# Patient Record
Sex: Female | Born: 1945
Health system: Southern US, Community
[De-identification: ages and names within clinical notes are randomized; demographics above are authoritative.]

## PROBLEM LIST (undated history)

## (undated) DIAGNOSIS — M1990 Unspecified osteoarthritis, unspecified site: Secondary | ICD-10-CM

## (undated) DIAGNOSIS — D649 Anemia, unspecified: Secondary | ICD-10-CM

## (undated) DIAGNOSIS — R011 Cardiac murmur, unspecified: Secondary | ICD-10-CM

## (undated) DIAGNOSIS — F32A Depression, unspecified: Secondary | ICD-10-CM

## (undated) DIAGNOSIS — F329 Major depressive disorder, single episode, unspecified: Secondary | ICD-10-CM

## (undated) DIAGNOSIS — Z9289 Personal history of other medical treatment: Secondary | ICD-10-CM

## (undated) DIAGNOSIS — G20 Parkinson's disease: Secondary | ICD-10-CM

## (undated) DIAGNOSIS — G20A1 Parkinson's disease without dyskinesia, without mention of fluctuations: Secondary | ICD-10-CM

## (undated) DIAGNOSIS — I10 Essential (primary) hypertension: Secondary | ICD-10-CM

## (undated) DIAGNOSIS — E785 Hyperlipidemia, unspecified: Secondary | ICD-10-CM

## (undated) DIAGNOSIS — I639 Cerebral infarction, unspecified: Secondary | ICD-10-CM

## (undated) HISTORY — PX: ABDOMINAL HYSTERECTOMY: SHX81

## (undated) HISTORY — DX: Cerebral infarction, unspecified: I63.9

## (undated) HISTORY — DX: Anemia, unspecified: D64.9

## (undated) HISTORY — DX: Hyperlipidemia, unspecified: E78.5

## (undated) HISTORY — DX: Unspecified osteoarthritis, unspecified site: M19.90

## (undated) HISTORY — PX: HAND SURGERY: SHX662

## (undated) HISTORY — DX: Essential (primary) hypertension: I10

---

## 1999-10-13 ENCOUNTER — Encounter: Admission: RE | Admit: 1999-10-13 | Discharge: 1999-11-04 | Payer: Self-pay | Admitting: Neurology

## 2000-07-14 ENCOUNTER — Other Ambulatory Visit: Admission: RE | Admit: 2000-07-14 | Discharge: 2000-07-14 | Payer: Self-pay | Admitting: Obstetrics and Gynecology

## 2000-10-06 ENCOUNTER — Ambulatory Visit (HOSPITAL_BASED_OUTPATIENT_CLINIC_OR_DEPARTMENT_OTHER): Admission: RE | Admit: 2000-10-06 | Discharge: 2000-10-06 | Payer: Self-pay | Admitting: Orthopedic Surgery

## 2001-06-20 ENCOUNTER — Emergency Department (HOSPITAL_COMMUNITY): Admission: EM | Admit: 2001-06-20 | Discharge: 2001-06-20 | Payer: Self-pay | Admitting: Emergency Medicine

## 2001-06-20 ENCOUNTER — Encounter: Payer: Self-pay | Admitting: Emergency Medicine

## 2001-09-01 ENCOUNTER — Other Ambulatory Visit: Admission: RE | Admit: 2001-09-01 | Discharge: 2001-09-01 | Payer: Self-pay | Admitting: Obstetrics and Gynecology

## 2002-09-01 ENCOUNTER — Other Ambulatory Visit: Admission: RE | Admit: 2002-09-01 | Discharge: 2002-09-01 | Payer: Self-pay | Admitting: Obstetrics and Gynecology

## 2011-03-11 ENCOUNTER — Emergency Department (HOSPITAL_COMMUNITY): Payer: Medicare Other

## 2011-03-11 ENCOUNTER — Inpatient Hospital Stay (HOSPITAL_COMMUNITY)
Admission: EM | Admit: 2011-03-11 | Discharge: 2011-03-16 | DRG: 065 | Disposition: A | Discharge status: Rehabilitation Facility | Payer: Medicare Other | Attending: Family Medicine | Admitting: Family Medicine

## 2011-03-11 DIAGNOSIS — G8190 Hemiplegia, unspecified affecting unspecified side: Secondary | ICD-10-CM | POA: Diagnosis present

## 2011-03-11 DIAGNOSIS — Z7982 Long term (current) use of aspirin: Secondary | ICD-10-CM

## 2011-03-11 DIAGNOSIS — M549 Dorsalgia, unspecified: Secondary | ICD-10-CM | POA: Diagnosis present

## 2011-03-11 DIAGNOSIS — M1990 Unspecified osteoarthritis, unspecified site: Secondary | ICD-10-CM | POA: Diagnosis present

## 2011-03-11 DIAGNOSIS — E785 Hyperlipidemia, unspecified: Secondary | ICD-10-CM | POA: Diagnosis present

## 2011-03-11 DIAGNOSIS — R29810 Facial weakness: Secondary | ICD-10-CM | POA: Diagnosis present

## 2011-03-11 DIAGNOSIS — F411 Generalized anxiety disorder: Secondary | ICD-10-CM | POA: Diagnosis present

## 2011-03-11 DIAGNOSIS — I6350 Cerebral infarction due to unspecified occlusion or stenosis of unspecified cerebral artery: Principal | ICD-10-CM | POA: Diagnosis present

## 2011-03-11 DIAGNOSIS — G609 Hereditary and idiopathic neuropathy, unspecified: Secondary | ICD-10-CM | POA: Diagnosis present

## 2011-03-11 DIAGNOSIS — K589 Irritable bowel syndrome without diarrhea: Secondary | ICD-10-CM | POA: Diagnosis present

## 2011-03-11 LAB — COMPREHENSIVE METABOLIC PANEL
ALT: 19 U/L (ref 0–35)
AST: 22 U/L (ref 0–37)
Albumin: 3.7 g/dL (ref 3.5–5.2)
Alkaline Phosphatase: 87 U/L (ref 39–117)
BUN: 10 mg/dL (ref 6–23)
CO2: 27 mEq/L (ref 19–32)
Calcium: 10 mg/dL (ref 8.4–10.5)
Chloride: 101 mEq/L (ref 96–112)
Creatinine, Ser: 0.67 mg/dL (ref 0.4–1.2)
GFR calc Af Amer: >60 mL/min (ref >60)
GFR calc non Af Amer: >60 mL/min (ref >60)
Glucose, Bld: 108 mg/dL — ABNORMAL HIGH (ref 70–99)
Potassium: 3.8 mEq/L (ref 3.5–5.1)
Sodium: 137 mEq/L (ref 135–145)
Total Bilirubin: 0.4 mg/dL (ref 0.3–1.2)
Total Protein: 7 g/dL (ref 6.0–8.3)

## 2011-03-11 LAB — DIFFERENTIAL
Basophils Absolute: 0 10*3/uL (ref 0.0–0.1)
Basophils Relative: 1 % (ref 0–1)
Eosinophils Absolute: 0.1 10*3/uL (ref 0.0–0.7)
Eosinophils Relative: 2 % (ref 0–5)
Lymphocytes Relative: 35 % (ref 12–46)
Lymphs Abs: 2.1 10*3/uL (ref 0.7–4.0)
Monocytes Absolute: 0.6 10*3/uL (ref 0.1–1.0)
Monocytes Relative: 10 % (ref 3–12)
Neutro Abs: 3 10*3/uL (ref 1.7–7.7)
Neutrophils Relative %: 52 % (ref 43–77)

## 2011-03-11 LAB — URINALYSIS, ROUTINE W REFLEX MICROSCOPIC
Bilirubin Urine: NEGATIVE
Glucose, UA: NEGATIVE mg/dL
Hgb urine dipstick: NEGATIVE
Ketones, ur: NEGATIVE mg/dL
Nitrite: NEGATIVE
Protein, ur: NEGATIVE mg/dL
Specific Gravity, Urine: 1.013 (ref 1.005–1.030)
Urobilinogen, UA: 0.2 mg/dL (ref 0.0–1.0)
pH: 7 (ref 5.0–8.0)

## 2011-03-11 LAB — CBC
HCT: 40.4 % (ref 36.0–46.0)
Hemoglobin: 13.7 g/dL (ref 12.0–15.0)
MCH: 30.2 pg (ref 26.0–34.0)
MCHC: 33.9 g/dL (ref 30.0–36.0)
MCV: 89 fL (ref 78.0–100.0)
Platelets: 366 10*3/uL (ref 150–400)
RBC: 4.54 MIL/uL (ref 3.87–5.11)
RDW: 13.1 % (ref 11.5–15.5)
WBC: 5.9 10*3/uL (ref 4.0–10.5)

## 2011-03-11 LAB — CK TOTAL AND CKMB (NOT AT ARMC)
CK, MB: 3.3 ng/mL (ref 0.3–4.0)
Relative Index: 2.6 — ABNORMAL HIGH (ref 0.0–2.5)
Total CK: 125 U/L (ref 7–177)

## 2011-03-11 LAB — PROTIME-INR
INR: 0.9 (ref 0.00–1.49)
Prothrombin Time: 12.4 seconds (ref 11.6–15.2)

## 2011-03-11 LAB — APTT: aPTT: 31 seconds (ref 24–37)

## 2011-03-11 LAB — TROPONIN I: Troponin I: <0.3 ng/mL (ref <0.30)

## 2011-03-12 DIAGNOSIS — G459 Transient cerebral ischemic attack, unspecified: Secondary | ICD-10-CM

## 2011-03-12 DIAGNOSIS — R42 Dizziness and giddiness: Secondary | ICD-10-CM

## 2011-03-12 LAB — CBC
HCT: 39.4 % (ref 36.0–46.0)
Hemoglobin: 13.2 g/dL (ref 12.0–15.0)
MCH: 29.9 pg (ref 26.0–34.0)
MCHC: 33.5 g/dL (ref 30.0–36.0)
MCV: 89.1 fL (ref 78.0–100.0)
Platelets: 374 10*3/uL (ref 150–400)
RBC: 4.42 MIL/uL (ref 3.87–5.11)
RDW: 13.1 % (ref 11.5–15.5)
WBC: 5.2 10*3/uL (ref 4.0–10.5)

## 2011-03-12 LAB — COMPREHENSIVE METABOLIC PANEL
ALT: 18 U/L (ref 0–35)
AST: 18 U/L (ref 0–37)
Albumin: 3.3 g/dL — ABNORMAL LOW (ref 3.5–5.2)
Alkaline Phosphatase: 81 U/L (ref 39–117)
BUN: 10 mg/dL (ref 6–23)
CO2: 24 mEq/L (ref 19–32)
Calcium: 9 mg/dL (ref 8.4–10.5)
Chloride: 104 mEq/L (ref 96–112)
Creatinine, Ser: 0.68 mg/dL (ref 0.4–1.2)
GFR calc Af Amer: >60 mL/min (ref >60)
GFR calc non Af Amer: >60 mL/min (ref >60)
Glucose, Bld: 110 mg/dL — ABNORMAL HIGH (ref 70–99)
Potassium: 4 mEq/L (ref 3.5–5.1)
Sodium: 137 mEq/L (ref 135–145)
Total Bilirubin: 0.3 mg/dL (ref 0.3–1.2)
Total Protein: 6.5 g/dL (ref 6.0–8.3)

## 2011-03-12 LAB — URINE CULTURE
Colony Count: >=100000
Culture  Setup Time: 201205021942

## 2011-03-12 LAB — LIPID PANEL
Cholesterol: 236 mg/dL — ABNORMAL HIGH (ref 0–200)
HDL: 72 mg/dL (ref >39)
LDL Cholesterol: 121 mg/dL — ABNORMAL HIGH (ref 0–99)
Total CHOL/HDL Ratio: 3.3 RATIO
Triglycerides: 216 mg/dL — ABNORMAL HIGH (ref <150)
VLDL: 43 mg/dL — ABNORMAL HIGH (ref 0–40)

## 2011-03-12 LAB — HEMOGLOBIN A1C
Hgb A1c MFr Bld: 6 % — ABNORMAL HIGH (ref <5.7)
Mean Plasma Glucose: 126 mg/dL — ABNORMAL HIGH (ref <117)

## 2011-03-12 NOTE — Consult Note (Signed)
Dana [name], LAU                ACCOUNT NO.:  [id number]  MEDICAL RECORD NO.:  [id number]           PATIENT TYPE:  E  LOCATION:  [facility]                         FACILITY:  [facility]  PHYSICIAN:  Dana Heritage, MD       DATE OF BIRTH:  19-Jul-1946  DATE OF CONSULTATION:  03/11/2011 DATE OF DISCHARGE:                                CONSULTATION   CHIEF COMPLAINT:  [name].  HISTORY OF PRESENT ILLNESS:  The patient is a 65 year old female, who presents to [facility] ED with main concern of left-sided upper and lower extremity weakness, which started approximately 2 a.m. morning of admission and is associated with left-sided facial weakness.  The patient tried to get up and walk and was having difficulty.  Mom reports slurred speech over the phone.  The patient denies similar episodes in the past.  Reports she also went to work in the morning, came back home since her weakness is getting progressively worse.  ALLERGIES:  PERCODAN causes nausea and vomiting.  PAST MEDICAL HISTORY: 1. Questionable anemia. 2. Questionable heart murmur since birth. 3. Chronic back pain. 4. History of appendectomy.  HOME MEDICATIONS:  Tylenol, calcium, cyclobenzaprine, dicyclomine, Enjuvia, gabapentin, and multivitamin.  FAMILY HISTORY:  Hypertension in both parents and diabetes in mother and sister.  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 Does not smoke, drink, or use illicit drugs.  PHYSICAL EXAMINATION:  VITAL SIGNS:  Temperature 98.1 Fahrenheit, blood pressure 175/97, heart rate 77, respirations 16, and oxygen saturation 98% on room air. GENERAL:  Lying in bed, not in acute distress. PULMONARY:  Clear to auscultation bilaterally.  No wheezing. CARDIOVASCULAR:  Regular rate and rhythm.  S1 and S2 present.  Systolic ejection murmur 2/6. NEUROLOGIC:  PERRLA and EOMI.  Mucous membranes moist.  Motor strength in  left upper extremity 4/5, right upper extremity 5/5, left lower extremity 4/5, and right lower extremity 5/5.  Sensation intact to soft touch and pinprick throughout, difficulty performing finger-to-nose with left arm, and left-sided facial weakness.  Other cranial nerves except cranial nerve VII grossly intact.  LABORATORY DATA:  Labs pending.  Chest x-ray, no acute findings.  CT of the head, no acute intracranial finding.  ASSESSMENT AND PLAN:  [name] with no significant past medical history presents to [facility] ED with left-sided weakness in upper and lower extremity with facial droop of upper motor neuron type. My impression is that stroke is localized more to the pontine area than right MCA subcortical internal capsular region. MRI will tell further details and shed light on the mechanism of stroke.  TPA was not given as the Symptoms onset was more than 10 hours.  PLAN: 1. Aspirin 325 mg once daily. 2. MRI-MRA of the brain. 3. Cardiac echo. 4. Carotid Doppler. 5. fasting lipid panel 6. Hemoglobin A1c. 7. Stroke team will followup.          ______________________________ Dana Heritage, MD     WS/MEDQ  D:  03/11/2011  T:  03/11/2011  Job:  [id number]  Electronically Signed  by Dana Heritage MD on 03/12/2011 07:23:52 AM

## 2011-03-12 NOTE — H&P (Signed)
NAMEJALANI, Dana [name]                ACCOUNT NO.:  [id number]  MEDICAL RECORD NO.:  [id number]           PATIENT TYPE:  E  LOCATION:  [facility]                         FACILITY:  [facility]  PHYSICIAN:  Clydia Llano, MD       DATE OF BIRTH:  Apr 28, 1946  DATE OF ADMISSION:  03/11/2011 DATE OF DISCHARGE:                             HISTORY & PHYSICAL   PRIMARY PHYSICIAN:  Juluis Rainier, MD  REASON FOR ADMISSION:  Left-sided weakness with facial droop.  HISTORY OF PRESENT ILLNESS:  Dana [name] is a 65 year old Caucasian female with past medical history of chronic pain, hyperlipidemia.  The patient was in her usual state of health when she went to the bed yesterday.  She slept in her sofa.  At about 1 in the morning, she tried to get up, she could not, and again 4 in the morning she tried to get up to go to her bed, and at that time she went but she felt awfully week. In the morning, when she got up she felt oozy and dizzy and she went to Dr. Zachery Dauer' office.  The patient was evaluated there and found to have high blood pressure, was sent to the emergency department for further evaluation.  Upon initial evaluation, CT scan of the head was done which showed some white matter hypodensities could be acute or subacute strokes.  The patient admitted for further evaluation.  PAST MEDICAL HISTORY: 1. Hyperlipidemia. 2. Peripheral neuropathy. 3. IBS. 4. Chronic anxiety. 5. Chronic back pain.  ALLERGIES:  PERCODAN.  MEDICATIONS: 1. Acetaminophen 500 mg 2 capsules at bedtime p.r.n. for pain. 2. Gabapentin 300 mg 3 times a day. 3. Calcium plus vitamin D6/400 one tablet p.o. daily. 4. Flexeril 5 mg 1-2 tablets 3 times a day. 5. One-A-Day Women's tablet, 1 tablet p.o. daily. 6. Enjuvia 0.625 mg p.o. daily. 7. Wellbutrin XL 300 mg p.o. extended release daily. 8. Ocuvite capsule p.o. daily. 9. Dicyclomine 10 mg 1-2 capsules 4 times a day as needed for     irritable bowel syndrome.  FAMILY  HISTORY:  The patient's parents are still alive.  Father In his late 15s, had CABG.  Mother in her late 30s, had CABG also but they are alive and well.  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  Does not smoke.  Does not drink.  She does not use illicit drugs.  The patient lives alone at home.  REVIEW OF SYSTEMS:  GENERAL:  Denies fever, chills, sweats.  HEENT: Denies headache.  No discharge.  SKIN:  Denies rash or lesions. CARDIAC:  Denies chest pain.  Denies palpitations.  PULMONARY:  Denies shortness of breath.  Denies wheezing, cough.  GU:  Denies frequency, urgency, or dysuria.  NEUROLOGIC:  Denies weakness, numbness, and mood disturbances.  MUSCULOSKELETAL:  Denies arthralgia, joint swelling, deformity, or pain.  GI:  Denies nausea, vomiting, diarrhea.  ENDOCRINE: Denies polyuria, polydipsia, heat or cold intolerance.  PSYCHIATRIC: Denies mood disturbances or depression.  HEMATOLOGY:  Denies easy bruisability or hematological or recent blood transfusion.  PHYSICAL EXAMINATION:  VITAL  SIGNS:  Temperature is 98.0, respiration is 16, pulse rate is 77, blood pressure is 170/82, O2 sats 96% on room air. GENERAL:  The patient is well developed, well nourished. HEAD AND FACE:  Normocephalic, atraumatic.  Pupils equal, reactive to light and accommodation.  No scleral icterus. EAR, NOSE, AND THROAT:  Normal. MOUTH:  The right mouth angle goes further to the right but there is no thrush or lesions. NECK:  Supple.  No lymphadenopathy.  No meningeal signs. CARDIOVASCULAR:  Regular rate and rhythm.  No murmurs, rubs, or gallops. ABDOMEN:  Bowel sounds heard.  Soft, nontender, distended. EXTREMITIES:  Normal.  There is no pedal edema. NEUROLOGIC:  Alert, awake, oriented x3.  Cranial nerves II through XII are grossly intact.  Sensation normal.  There is left arm and left leg weakness.  The left side of the body at least 3/5 motor.  There is  right facial droop. SKIN:  Color normal.  No rash. PSYCHIATRIC:  Alert, awake, oriented x4.  No abnormality of mood or affect.  RADIOLOGY: 1. CT scan of the head showed asymmetric subcortical white matter     hypodensities, could be acute or chronic. 2. MRI of the head:  prelim report MRI of the     brain showed right-sided small brainstem stroke.  LABORATORY DATA:  UA negative.  CBC, WBC is 5.9, hemoglobin 13.7, hematocrit 40.4, platelets 366.  BMP, sodium 137, potassium 3.8, chloride 101, bicarb is 27, glucose 108, BUN is 10, creatinine is 0.6. Coags, INR is 0.9.  LFTs, AST 22, ALT 19, alk phos is 87, total bilirubin is 0.4.  Cardiac enzymes, CK-MB is 3.3 relative index, CK total 125, troponin is less than 0.3.  ASSESSMENT AND PLAN: 1. Acute stroke.  The patient admitted to the Hospitalist, started on     aspirin.  The patient was not tPA candidate because the timing of     the stroke was unknown.  The patient was being seen by Dr. Hoy Morn     from Neurology.  Recommended to continue on aspirin and complete     stroke workup.  Stroke team will follow. 2. Elevated high blood pressure without hypertension.  This is     probably reaction to the stroke.  We will use     hydralazine as needed for the blood pressure.  Probably, the     patient at the time of discharge will not need any blood pressure     medications. 3. Chronic back pain and degenerative joint disease.  Continue on     gabapentin as well as other pain medications.     Clydia Llano, MD     ME/MEDQ  D:  03/11/2011  T:  03/11/2011  Job:  [id number]  cc:   Juluis Rainier, M.D.  Electronically Signed by Clydia Llano  on 03/12/2011 12:43:52 PM

## 2011-03-13 DIAGNOSIS — G8110 Spastic hemiplegia affecting unspecified side: Secondary | ICD-10-CM

## 2011-03-13 DIAGNOSIS — I6330 Cerebral infarction due to thrombosis of unspecified cerebral artery: Secondary | ICD-10-CM

## 2011-03-13 LAB — POCT I-STAT, CHEM 8
BUN: 9 mg/dL (ref 6–23)
Calcium, Ion: 1.13 mmol/L (ref 1.12–1.32)
Chloride: 106 mEq/L (ref 96–112)
Creatinine, Ser: 0.8 mg/dL (ref 0.4–1.2)
Glucose, Bld: 107 mg/dL — ABNORMAL HIGH (ref 70–99)
HCT: 42 % (ref 36.0–46.0)
Hemoglobin: 14.3 g/dL (ref 12.0–15.0)
Potassium: 3.7 mEq/L (ref 3.5–5.1)
Sodium: 139 mEq/L (ref 135–145)
TCO2: 26 mmol/L (ref 0–100)

## 2011-03-15 LAB — GLUCOSE, CAPILLARY: Glucose-Capillary: 110 mg/dL — ABNORMAL HIGH (ref 70–99)

## 2011-03-16 ENCOUNTER — Inpatient Hospital Stay (HOSPITAL_COMMUNITY)
Admission: RE | Admit: 2011-03-16 | Discharge: 2011-03-31 | DRG: 945 | Disposition: A | Discharge status: Home Health | Payer: Medicare Other | Source: Other Acute Inpatient Hospital | Attending: Physical Medicine & Rehabilitation | Admitting: Physical Medicine & Rehabilitation

## 2011-03-16 DIAGNOSIS — K589 Irritable bowel syndrome without diarrhea: Secondary | ICD-10-CM | POA: Diagnosis present

## 2011-03-16 DIAGNOSIS — G8929 Other chronic pain: Secondary | ICD-10-CM | POA: Diagnosis present

## 2011-03-16 DIAGNOSIS — I6330 Cerebral infarction due to thrombosis of unspecified cerebral artery: Secondary | ICD-10-CM

## 2011-03-16 DIAGNOSIS — Z5189 Encounter for other specified aftercare: Principal | ICD-10-CM

## 2011-03-16 DIAGNOSIS — G609 Hereditary and idiopathic neuropathy, unspecified: Secondary | ICD-10-CM | POA: Diagnosis present

## 2011-03-16 DIAGNOSIS — M545 Low back pain, unspecified: Secondary | ICD-10-CM | POA: Diagnosis present

## 2011-03-16 DIAGNOSIS — IMO0002 Reserved for concepts with insufficient information to code with codable children: Secondary | ICD-10-CM | POA: Diagnosis present

## 2011-03-16 DIAGNOSIS — I10 Essential (primary) hypertension: Secondary | ICD-10-CM | POA: Diagnosis present

## 2011-03-16 DIAGNOSIS — I6350 Cerebral infarction due to unspecified occlusion or stenosis of unspecified cerebral artery: Secondary | ICD-10-CM | POA: Diagnosis present

## 2011-03-16 DIAGNOSIS — G8190 Hemiplegia, unspecified affecting unspecified side: Secondary | ICD-10-CM | POA: Diagnosis present

## 2011-03-17 DIAGNOSIS — G8110 Spastic hemiplegia affecting unspecified side: Secondary | ICD-10-CM

## 2011-03-17 DIAGNOSIS — I69921 Dysphasia following unspecified cerebrovascular disease: Secondary | ICD-10-CM

## 2011-03-17 DIAGNOSIS — I6330 Cerebral infarction due to thrombosis of unspecified cerebral artery: Secondary | ICD-10-CM

## 2011-03-17 LAB — COMPREHENSIVE METABOLIC PANEL
ALT: 48 U/L — ABNORMAL HIGH (ref 0–35)
AST: 33 U/L (ref 0–37)
Albumin: 3.4 g/dL — ABNORMAL LOW (ref 3.5–5.2)
Alkaline Phosphatase: 92 U/L (ref 39–117)
BUN: 24 mg/dL — ABNORMAL HIGH (ref 6–23)
CO2: 26 mEq/L (ref 19–32)
Calcium: 10.1 mg/dL (ref 8.4–10.5)
Chloride: 102 mEq/L (ref 96–112)
Creatinine, Ser: 0.76 mg/dL (ref 0.4–1.2)
GFR calc Af Amer: >60 mL/min (ref >60)
GFR calc non Af Amer: >60 mL/min (ref >60)
Glucose, Bld: 103 mg/dL — ABNORMAL HIGH (ref 70–99)
Potassium: 4.4 mEq/L (ref 3.5–5.1)
Sodium: 138 mEq/L (ref 135–145)
Total Bilirubin: 0.3 mg/dL (ref 0.3–1.2)
Total Protein: 7.1 g/dL (ref 6.0–8.3)

## 2011-03-17 LAB — CBC
HCT: 42.5 % (ref 36.0–46.0)
Hemoglobin: 14.7 g/dL (ref 12.0–15.0)
MCH: 31.1 pg (ref 26.0–34.0)
MCHC: 34.6 g/dL (ref 30.0–36.0)
MCV: 89.9 fL (ref 78.0–100.0)
Platelets: 402 10*3/uL — ABNORMAL HIGH (ref 150–400)
RBC: 4.73 MIL/uL (ref 3.87–5.11)
RDW: 12.9 % (ref 11.5–15.5)
WBC: 7.1 10*3/uL (ref 4.0–10.5)

## 2011-03-17 LAB — DIFFERENTIAL
Basophils Absolute: 0 10*3/uL (ref 0.0–0.1)
Basophils Relative: 1 % (ref 0–1)
Eosinophils Absolute: 0.2 10*3/uL (ref 0.0–0.7)
Eosinophils Relative: 3 % (ref 0–5)
Lymphocytes Relative: 30 % (ref 12–46)
Lymphs Abs: 2.2 10*3/uL (ref 0.7–4.0)
Monocytes Absolute: 0.7 10*3/uL (ref 0.1–1.0)
Monocytes Relative: 10 % (ref 3–12)
Neutro Abs: 4.1 10*3/uL (ref 1.7–7.7)
Neutrophils Relative %: 57 % (ref 43–77)

## 2011-03-17 LAB — URIC ACID: Uric Acid, Serum: 4.9 mg/dL (ref 2.4–7.0)

## 2011-03-20 DIAGNOSIS — G8110 Spastic hemiplegia affecting unspecified side: Secondary | ICD-10-CM

## 2011-03-20 DIAGNOSIS — I69921 Dysphasia following unspecified cerebrovascular disease: Secondary | ICD-10-CM

## 2011-03-20 DIAGNOSIS — I6330 Cerebral infarction due to thrombosis of unspecified cerebral artery: Secondary | ICD-10-CM

## 2011-03-20 LAB — GLUCOSE, CAPILLARY
Glucose-Capillary: 98 mg/dL (ref 70–99)
Glucose-Capillary: 87 mg/dL (ref 70–99)

## 2011-03-23 DIAGNOSIS — G8110 Spastic hemiplegia affecting unspecified side: Secondary | ICD-10-CM

## 2011-03-23 DIAGNOSIS — I69921 Dysphasia following unspecified cerebrovascular disease: Secondary | ICD-10-CM

## 2011-03-23 DIAGNOSIS — Z5189 Encounter for other specified aftercare: Secondary | ICD-10-CM

## 2011-03-23 DIAGNOSIS — I6330 Cerebral infarction due to thrombosis of unspecified cerebral artery: Secondary | ICD-10-CM

## 2011-03-26 LAB — BASIC METABOLIC PANEL
BUN: 18 mg/dL (ref 6–23)
CO2: 28 mEq/L (ref 19–32)
Calcium: 9.4 mg/dL (ref 8.4–10.5)
Chloride: 105 mEq/L (ref 96–112)
Creatinine, Ser: 0.75 mg/dL (ref 0.4–1.2)
GFR calc Af Amer: >60 mL/min (ref >60)
GFR calc non Af Amer: >60 mL/min (ref >60)
Glucose, Bld: 94 mg/dL (ref 70–99)
Potassium: 3.8 mEq/L (ref 3.5–5.1)
Sodium: 140 mEq/L (ref 135–145)

## 2011-03-28 DIAGNOSIS — G8110 Spastic hemiplegia affecting unspecified side: Secondary | ICD-10-CM

## 2011-03-28 DIAGNOSIS — I6330 Cerebral infarction due to thrombosis of unspecified cerebral artery: Secondary | ICD-10-CM

## 2011-03-28 DIAGNOSIS — I69921 Dysphasia following unspecified cerebrovascular disease: Secondary | ICD-10-CM

## 2011-03-30 ENCOUNTER — Inpatient Hospital Stay (HOSPITAL_COMMUNITY): Payer: Medicare Other

## 2011-03-30 DIAGNOSIS — G8110 Spastic hemiplegia affecting unspecified side: Secondary | ICD-10-CM

## 2011-03-30 DIAGNOSIS — I6330 Cerebral infarction due to thrombosis of unspecified cerebral artery: Secondary | ICD-10-CM

## 2011-03-30 DIAGNOSIS — Z5189 Encounter for other specified aftercare: Secondary | ICD-10-CM

## 2011-03-30 DIAGNOSIS — I69921 Dysphasia following unspecified cerebrovascular disease: Secondary | ICD-10-CM

## 2011-04-08 NOTE — Discharge Summary (Signed)
Dana [name], Dana [name]                ACCOUNT NO.:  [id number]  MEDICAL RECORD NO.:  [id number]           PATIENT TYPE:  I  LOCATION:  4142                         FACILITY:  [facility]  PHYSICIAN:  Erick Colace, M.D.DATE OF BIRTH:  1946-10-15  DATE OF ADMISSION:  03/16/2011 DATE OF DISCHARGE:  03/31/2011                              DISCHARGE SUMMARY   DISCHARGE DIAGNOSES: 1. Right pontine infarct with left hemiparesis and dysarthria. 2. Chronic pain. 3. Depression. 4. Hypertension. 5. Dyslipidemia.  HISTORY OF PRESENT ILLNESS:  Dana [name] is a 65 year old female with history of DDD with low back pain and peripheral neuropathy, admitted on Mar 11, 2011 with left-sided weakness and facial droop.  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  The patient currently continues with left-sided weakness as well as facial weakness.  She was evaluated by rehab and we felt that she would benefit from a CIR program.  Neurology has evaluated the patient and recommends aspirin for CVA prophylaxis.  PAST MEDICAL HISTORY:  Significant for: 1. Chronic low back pain with peripheral neuropathy. 2. IBS. 3. Anxiety disorder. 4. Dyslipidemia.  ALLERGIES:  PERCODAN.  REVIEW OF SYMPTOMS:  Positive for some anxiety, weakness on left side as well as some issues with lumbago.  FAMILY HISTORY:  Positive for coronary artery disease.  SOCIAL HISTORY:  The patient is single, lives alone and was working three part-time jobs prior to admission.  Does not use any tobacco or alcohol.  Parents are in the 61s; however, father can provide supervision past discharge.  FUNCTIONAL HISTORY:  The patient was independent in driving prior to admission.  FUNCTIONAL STATUS:  The patient is min-to-guard assist transfers  and min- to-guard assist ambulating 15-30 feet with rolling walker, requires setup to min assist for upper body care, min assist for lower body care.  PHYSICAL EXAMINATION:  VITAL SIGNS:  Blood pressure 140/82, pulse 82, respiratory rate 16. GENERAL:  The patient is well-nourished and well-developed female, pleasant in no acute distress. HEENT:  Pupils are equal, round, and reactive to light.  Oral mucosa is pink and moist.  Nares patent.  Teeth in good repair. NECK:  Supple without JVD or lymphadenopathy. HEART:  Shows regular rate and rhythm without murmurs, gallops, or rubs. LUNGS:  Clear to auscultation bilaterally without wheezes, rales, or rhonchi. ABDOMEN:  Soft, nontender with positive bowel sounds. EXTREMITIES:  Showed no evidence of clubbing, cyanosis, or edema. NEUROLOGIC:  Cranial nerves II-XII notable for left central VII with tongue deviation.  Speech is slightly dysarthric.  She has decreased reflexes in both lower extremities.  Sensation is grossly intact except for some diminishment in distal feet.  Strength is generally 5/5 right upper extremity, 5/5 right lower extremity except for perhaps some mild weakness in ankle dorsiflexion, plantar flexion, strength in left upper extremities 1/5 at deltoids, biceps, triceps and finger flexures, hip flexures and quads are 1+ to anterior, gastroc 1+ on the left.  The patient without any visual  spatial deficits.  Cognitively she is alert. The patient with good cough and gag reflex.  HOSPITAL COURSE:  Dana [name] was admitted to rehab on Mar 16, 2011 for inpatient therapies to consist of PT, OT and speech therapy at least 3 hours 5 days a week.  Past admission, physiatrist, rehab RN and therapy team have worked together to provide customized collaborative interdisciplinary care.  The patient was maintained on aspirin for CVA prophylaxis throughout the stay.  Chronic back pain has been controlled with Neurontin on t.i.d.  basis.  The patient was noted to have borderline high hemoglobin A1c at admission at 6.0.  The patient was placed on carb-modified diet.  RDI has followed up with the patient on education regarding carb-modified diet past discharge to help with the patient keep her blood sugars under control.  Labs were done past admission revealing H and H at 14.7 and 42.5, white count 7.1, platelets 402.  Check of lytes relieved some renal insufficiency with BUN at 24, blood sugar at 103.  LFTs revealed AST 33, ALT 48, total protein 7.1, albumin 3.4.  Uric acid level was normal at 4.9.  Recheck of lytes on Mar 26, 2011 shows improvement of renal status with BUN at 18, creatinine at 0.75.  Electrolytes revealed sodium 140, potassium 3.8, chloride 105, CO2 28.  The patient's blood pressures have been checked on b.i.d. basis during this stay.  These are currently ranging from 100- 120 systolic and 60s to 80s diastolic.  The patient has been continent of bowel and bladder.  During the patient's stay in rehab, weekly team conferences were held to monitor the patient's progress, set goals as well as discuss barriers todischarge.  At time of admission, the patient was noted to have poor sitting and standing balance due to her left hemiplegia.  She was noted to have poor muscular endurance with abnormal postural control, impaired sensation and decrease gait speed.  The patient was at min assist for bed mobility and transfers.  She was min-to-mod assist for ambulating few steps.  Her Berg score at admission was 35 to 56 putting at high risk for falls.  Currently, the patient has improved in her balance and mobility.  Her Berg score at 47 out of 56.  Gait speed is at normal pace in supervised setting and level of neighborhood.  Currently, the patient is supervision for ambulating 150 feet with rolling walker, able to navigate five stairs with supervision.  She is modified independent in supervised setting and was  made modified independent in the room. Family education was done with the son regarding supervision and current progress made.  OT has worked with the patient on self-care tasks.  They have focused on LUE neuro reducation and sensory testing.  At admission, the patient was noted to have a decreased dynamic standing balance as well as mild left inattention impacting her independence with ADL and IADL tasks.  Currently, the patient has made excellent progress and is at modified independent for all ADL tasks.  She is improved from Brunstrom III in the arm to Brunstrom IV and Brunstrom II in the hand to Brunstrom V. She is incorporating left upper extremity in all tasks voluntarily. Family education has been completed and the patient to continue with further followup home health PT, OT past discharge.  Speech Therapy did follow up with the patient on her mild dysarthria.  Evaluation revealed the patient with 90% intelligibility at conversation level due to facial and labial weakness.  The patient was educated regarding overall motor exercises to increase the facial and labial strength as well as utilizing speech compensatory strategies to improve speech to 100% accuracy in conversation level.  Currently, the patient is independent for utilizing compensatory strategies and is independently completin oro-motor exercises.  DISCHARGE MEDICATIONS: 1. Norvasc 5 mg b.i.d. 2. Coated aspirin 325 mg a day. 3. Colace 100 mg b.i.d. 4. Maalox Plus 30 mL q.4 h. p.r.n. indigestion. 5. Metoprolol 25 mg b.i.d. 6. Tylenol 500 mg two p.o. nightly p.r.n. 7. Caltrate plus D one per day. 8. Flexeril 5 mg b.i.d. and 10 mg nightly p.r.n. 9. Gabapentin 300 mg p.o. t.i.d. 10.Multivitamin one per day. 11.Ocuvite one per day. 12.Zocor 20 mg p.o. nightly. 13.Wellbutrin XL 300 mg p.o. per day.  DIET:  Carb modified, regular textures.  Activity level is intermittent supervision.  No strenuous activity.  SPECIAL  INSTRUCTIONS:  No alcohol, no smoking, no driving.  [facility] to provide PT/OT.  FOLLOWUP:  The patient to follow up with Dr. Wynn Banker on April 27, 2011 at 12:30 for 1 p.m.  Follow up with Dr. Zachery Dauer in 2 weeks.  Follow up with Dr. Pearlean Brownie in 6 weeks.     Delle Reining, P.A.   ______________________________ Erick Colace, M.D.    PL/MEDQ  D:  03/31/2011  T:  04/01/2011  Job:  [id number]  cc:   Pramod P. Pearlean Brownie, MD Dossie Der, MD  Electronically Signed by Osvaldo Shipper. on 04/07/2011 02:34:18 PM Electronically Signed by Claudette Laws M.D. on 04/08/2011 09:30:06 AM

## 2011-04-08 NOTE — Discharge Summary (Signed)
NAMECHARISE, Dana [name]                ACCOUNT NO.:  [id number]  MEDICAL RECORD NO.:  [id number]           PATIENT TYPE:  I  LOCATION:  3023                         FACILITY:  [facility]  PHYSICIAN:  Mauro Kaufmann, MD         DATE OF BIRTH:  [birth year]  DATE OF ADMISSION:  03/11/2011 DATE OF DISCHARGE:  03/16/2011                              DISCHARGE SUMMARY   PRIMARY CARE PHYSICIAN:  Dr. Juluis Rainier.  ADMISSION DIAGNOSES: 1. Acute stroke. 2. Hypertension. 3. Chronic back pain.  DISCHARGE DIAGNOSES:  Include: 1. Right pontine infarct. 2. Hypertension. 3. Hyperlipidemia.  TESTS PERFORMED DURING THE HOSPITAL STAY:  Include: 1. Chest x-ray on May 2 showed no acute findings. 2. CT of the head with contrast showed asymmetric subcortical white     matter hypodensity, which could be acute or chronic, no hemorrhage,     mass, or lesions. 3. MRI of the head showed acute nonhemorrhagic superior right pontine     infarct, moderate white matter type changes probably due to results     of small vessel disease. 4. MRA of the head showed intracranial arteriosclerotic changes.  Of     note, there is moderate stenosis of the mid aspect of the basilar     artery. 5. The patient had echocardiogram, which showed EF of 55%-60% and also     grade 1 diastolic dysfunction.  Also, the patient had a carotid     duplex which showed no evidence of ICA stenosis.  CONSULTS OBTAINED DURING THE HOSPITAL STAY:  Neurology consultation.  BRIEF HISTORY AND PHYSICAL:  This is a 65 year old female with history of chronic pain, hyperlipidemia, came to the hospital because of the weakness.  The patient was found to have acute stroke and she was found to not be a candidate for TPA because of the timing, the stroke was unknown.  The patient was started on aspirin and admitted for further evaluation.  BRIEF HOSPITAL COURSE: 1. Right pontine infarct.  The patient had a MRI of the brain which     showed right  pontine infarct.  She has deficit with weakness of the     left arm and has been started on aspirin.  The reason for the     stroke most likely is small vessel disease due to the hypertension.     Also, the patient was taking estrogens, which should be     discontinued.  The patient needs inpatient rehab for the left arm     weakness. 2. Hypertension.  The patient has probably hypertension for long time,     which was untreated.  She has been started on metoprolol and     Norvasc and she should be continued on that. 3. Hyperlipidemia.  The patient will be continue on Zocor.  On the day of discharge, the patient's vitals, temperature 98.0, pulse 65, respirations 18, blood pressure 112/66, O2 sat 96% on room air.  MEDICATIONS ON DISCHARGE:  Include: 1. Amlodipine 5 mg p.o. b.i.d. 2. Aspirin 325 mg p.o. daily. 3. Metoprolol 25 mg p.o. twice a day. 4. Simvastatin 20  mg p.o. daily. 5. Acetaminophen 500 mg 2 capsule by mouth daily at bedtime. 6. Calcium 1 tablet p.o. daily. 7. Cyclobenzaprine 5 mg 1-2 tablets by mouth. 8. Dicyclomine 10 mg 1-2 tabs by mouth four times a day as needed. 9. Gabapentin 300 mg 1 capsule p.o. t.i.d. 10.Multivitamin 1 tablet p.o. daily. 11.Ocuvite 1 tablet p.o. daily. 12.Wellbutrin XL 300 mg 1 tablet p.o. every morning. 13.Stop taking estrogens.  The patient at this time will be transferred to inpatient rehab for physical therapy.  Although, the patient will follow up with Dr. Pearlean Brownie in 1-2 months.     Mauro Kaufmann, MD     GL/MEDQ  D:  03/16/2011  T:  03/17/2011  Job:  [id number]  cc:   Juluis Rainier, MD Pramod P. Pearlean Brownie, MD  Electronically Signed by Mauro Kaufmann  on 04/08/2011 12:10:48 PM

## 2011-04-27 ENCOUNTER — Encounter: Payer: Medicare Other | Attending: Physical Medicine & Rehabilitation

## 2011-04-27 ENCOUNTER — Inpatient Hospital Stay (HOSPITAL_BASED_OUTPATIENT_CLINIC_OR_DEPARTMENT_OTHER): Payer: Medicare Other | Admitting: Physical Medicine & Rehabilitation

## 2011-04-27 DIAGNOSIS — R279 Unspecified lack of coordination: Secondary | ICD-10-CM | POA: Insufficient documentation

## 2011-04-27 DIAGNOSIS — F329 Major depressive disorder, single episode, unspecified: Secondary | ICD-10-CM | POA: Insufficient documentation

## 2011-04-27 DIAGNOSIS — I69959 Hemiplegia and hemiparesis following unspecified cerebrovascular disease affecting unspecified side: Secondary | ICD-10-CM | POA: Insufficient documentation

## 2011-04-27 DIAGNOSIS — R29898 Other symptoms and signs involving the musculoskeletal system: Secondary | ICD-10-CM | POA: Insufficient documentation

## 2011-04-27 DIAGNOSIS — G8110 Spastic hemiplegia affecting unspecified side: Secondary | ICD-10-CM

## 2011-04-27 DIAGNOSIS — I6330 Cerebral infarction due to thrombosis of unspecified cerebral artery: Secondary | ICD-10-CM

## 2011-04-27 DIAGNOSIS — F3289 Other specified depressive episodes: Secondary | ICD-10-CM | POA: Insufficient documentation

## 2011-04-27 DIAGNOSIS — R197 Diarrhea, unspecified: Secondary | ICD-10-CM | POA: Insufficient documentation

## 2011-04-27 NOTE — H&P (Signed)
NAMEKHALIYA, GOLINSKI                ACCOUNT NO.:  [id number]  MEDICAL RECORD NO.:  [id number]           PATIENT TYPE:  I  LOCATION:  4142                         FACILITY:  [facility]  PHYSICIAN:  Ranelle Oyster, M.D.DATE OF BIRTH:  06-23-1946  DATE OF ADMISSION:  03/16/2011 DATE OF DISCHARGE:                             HISTORY & PHYSICAL   CHIEF COMPLAINT:  Left-sided weakness.  HISTORY OF PRESENT ILLNESS:  This is a pleasant 65 year old white female with a peripheral neuropathy and degenerative disk disease, admitted on Mar 11, 2011, with left-sided weakness and facial droop.  MRI of the brain showed acute nonhemorrhagic right pontine infarct with moderate small vessel disease and moderate stenosis of the mid basilar artery and prominent infundibulum versus small aneurysm of the left PCA, left choroidal artery origin.  Neurology recommended aspirin for stroke prophylaxis.  Continues to have left-sided weakness but is showing some improvement.  Rehab is consulted to see the patient on Mar 12, 2011, and felt that she could benefit from an inpatient rehab stay.  REVIEW OF SYSTEMS:  Notable for dysarthria, back pain on the left side, mild right lower extremity weakness.  She has constipation.  Full review is in the written H and P.  PAST MEDICAL HISTORY:  Positive for chronic low back pain, IBS, anxiety disorder, peripheral neuropathy, increased lipids.  FAMILY HISTORY:  Positive for CAD.  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  She does not drink or smoke.  Parents are in 49s and father can assist somewhat at discharge.  ALLERGIES:  PERCODAN.  HOME MEDICATIONS:  Zocor, Neurontin, and Flexeril.  PHYSICAL EXAMINATION:  VITAL SIGNS:  Blood pressure is 140/82, pulse is 82, respiratory rate 16.  She is satting 98% on room air. GENERAL:  The patient is pleasant, alert and  oriented x3. HEENT:  Pupils equal, round, and reactive to light. NECK:  Supple without JVD or lymphadenopathy. HEART:  Regular rate and rhythm without murmurs, rubs, or gallops. ABDOMEN:  Soft, nontender. CHEST:  Clear to auscultation bilaterally without any wheezes, rales, or rhonchi. NEUROLOGIC:  Cranial nerves II-XII notable for left central VII and tongue deviation.  Speech is slightly dysarthric.  She has decreased reflexes in both lower extremities.  Sensation is grossly intact except perhaps some diminishment of the distal feet.  Strength is generally 5/5 in right upper extremity.  She has 5/5 right lower extremity except for perhaps some mild weakness with ankle dorsiflexion and plantar flexion. Strength in the left upper extremity is 1 at the deltoid, biceps, triceps, and finger flexors.  Hip flexors and quad 1+.  Tib anterior and gastroc are 1/5 on the left.  She has no visual spatial deficits. Cognitively, she is alert.  She has good cough gag and phonation.  POST ADMISSION PHYSICIAN EVALUATION: 1. Functional deficit secondary to right pontine infarct with left     hemiparesis, dysarthria. 2. The patient was admitted to receive collaborative interdisciplinary     care between the physiatrist, rehab nursing staff, and therapy  team. 3. The patient's level of medical complexity and substantial therapy     needs in context of that medical necessity cannot be provided at a     lesser intensity of care. 4. The patient experienced substantial functional loss from her     baseline.  Premorbidly, the patient was independent.  Currently,     she is min assist for basic bed mobility transfers.  She is min to     mod assist for basic ambulation outside the bed of a foot or 2.     She is min-to-mod assist for dressing and bathing as well as mod     assist for toilet transfers.  Judging by the patient's diagnosis,     physical exam, and functional history, she has potential for      functional progress which will result in measurable gains while     inpatient rehab.  These gains will be of substantial and practical     use upon discharge home in facilitating mobility and self-care.     Interim changes in medical status since preadmission screening are     detailed above. 5. The physiatrist will provide 24-hour management of medical needs as     well as oversight of the therapy plan/treatment and provide     guidance as appropriate regarding interaction of the two.  Medical     problem list and plan are below. 6. A 24-hour rehab nursing team will assist in management of the     patient's skin care needs as well as bowel and bladder function,     safety awareness, integration of therapy concepts and techniques. 7. PT will assess and treat for lower extremity strength, range of     motion, neuromuscular education, adaptive techniques, and equipment     goals modified independent for wheelchair mobility and short     distance gait. 8. OT will assess and treat for upper extremity use, ADLs, adaptive     techniques, equipment, functional mobility, safety, upper extremity     strengthening, neuromuscular education, and family education with     goals modified independent to perhaps set up. 9. Speech language pathology will assess and treat for dysarthria with     modified independent goals. 10.Case management and social worker will assess and treat for     psychosocial issues and discharge planning.  Depression screen will     be performed. 11.Team conference will be held weekly to assess the patient's     progress towards goals and to determine barriers at discharge. 12.The patient demonstrated sufficient medical stability and exercise     capacity to tolerate at least 3 hours of therapy per day at least 5     days per week. 13.Estimated length of stay is approximately 3 weeks.  Prognosis is     good.  The patient is extremely motivated.  MEDICAL PROBLEM LIST AND  PLAN:    1  Deep vein thrombosis prophylaxis with subcu Lovenox.  No active signs of bleeding on examination today.  We will follow for platelets or any other side effects sequelae. 1. Hypertension:  Better controlled at present with Norvasc and     metoprolol.  Heart rate has remained in the 70s plus. 2. Paronychia:  This seems to have improved with ibuprofen and     conservative measures.  We will observe only for now. 3. Pain management/peripheral neuropathy:  Neurontin 300 mg t.i.d. per     baseline. 4. Irritable bowel  syndrome:  The patient additionally has diarrhea     but now suffering from constipation.  We will ad probiotic and     initiate scheduled and p.r.n. stool softener/laxative as well as     Bentyl p.r.n. for spasms and diarrhea this should occur. 5. Stroke prophylaxis with aspirin 325 mg p.o. daily.  Follow for     platelets, any bleeding complications as well as     GI side effects. 6. Chronic low back pain:  Continue p.r.n. analgesics, particularly     Tylenol.  Encourage out of bed and better truncal support.     Ranelle Oyster, M.D.     ZTS/MEDQ  D:  03/16/2011  T:  03/17/2011  Job:  [id number]  Electronically Signed by Faith Rogue M.D. on 04/27/2011 12:29:38 PM

## 2011-04-28 NOTE — Assessment & Plan Note (Signed)
REASON FOR VISIT:  Left-sided weakness.  The patient is a 65 year old female who was on the Inpatient Rehabilitation Service Mar 16, 2011 to Mar 31, 2011.  Her date of stroke was Mar 10, 2011, right pontine infarct with left hemiparesis and dysarthria.  She had some moderate stenosis, mid basilar artery as well as small vessel disease accounting for a stroke.  No evidence of embolic phenomenon.  She went through inpatient rehabilitation, got to a modified independent ADL and supervision and mobility level by the time of discharge, has continued some home health therapy with Genevieve Norlander is slowing down on this at this point.  She still cannot use the left upper extremity for certain tasks such as keyboarding and other fine motor tasks.  Her balance is still off to the point where she still needs to use a cane.  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  Medications include amlodipine, Maalox, metoprolol, cyclobenzaprine, gabapentin, simvastatin and Wellbutrin.  She is not driving.  She has no pain.  Spasticity is well managed.  REVIEW OF SYSTEMS:  Positive for depression and diarrhea.  She is on antidepressant medications.  Social, widow, lives with her parents right now.  She drinks one alcoholic beverage in the evening.  PHYSICAL EXAMINATION:  VITAL SIGNS:  Blood pressure 132/63, pulse 79, respirations 16 and O2 sat 95% on room air. GENERAL:  Well-developed, well-nourished female, in no acute distress. She has mild left facial droop. EXTREMITIES:  Left upper extremity is 4- at the deltoid, biceps, triceps and grip and left lower extremity is 4- in the hip flexor, knee extension, ankle dorsiflexor.  Her gait is wide-based.  She has no evidence of toe drag or knee instability.  She uses a cane.  Left upper extremity finger to thumb opposition  is very slow.  She has dysdiadochokinesis in the left upper extremity alternating supination and pronation.  IMPRESSION: 1. Right-sided pontine cerebrovascular accident with left hemiparesis. 2. Decreased fine motor left upper extremity. 3. Decreased balance secondary to cerebrovascular accident.  PLAN:  We will continue some outpatient therapy 1 or 2 times for week working on fine motor skills left upper extremity as well as balance and even trying a step ladder at home.  I will see her back in one month. No work for now.  No driving for now.  We will address these in 1 month. Discussed with the patient, agrees with plan.     Erick Colace, M.D. Electronically Signed    AEK/MedQ D:  04/27/2011 13:31:32  T:  04/28/2011 84:69:62  Job #:  [id number]  cc:   Juluis Rainier, M.D.

## 2011-06-02 ENCOUNTER — Encounter: Payer: Medicare Other | Attending: Physical Medicine & Rehabilitation

## 2011-06-02 ENCOUNTER — Ambulatory Visit (HOSPITAL_BASED_OUTPATIENT_CLINIC_OR_DEPARTMENT_OTHER): Payer: Medicare Other | Admitting: Physical Medicine & Rehabilitation

## 2011-06-02 DIAGNOSIS — F329 Major depressive disorder, single episode, unspecified: Secondary | ICD-10-CM | POA: Insufficient documentation

## 2011-06-02 DIAGNOSIS — I69959 Hemiplegia and hemiparesis following unspecified cerebrovascular disease affecting unspecified side: Secondary | ICD-10-CM | POA: Insufficient documentation

## 2011-06-02 DIAGNOSIS — S43429A Sprain of unspecified rotator cuff capsule, initial encounter: Secondary | ICD-10-CM

## 2011-06-02 DIAGNOSIS — I6330 Cerebral infarction due to thrombosis of unspecified cerebral artery: Secondary | ICD-10-CM

## 2011-06-02 DIAGNOSIS — R29898 Other symptoms and signs involving the musculoskeletal system: Secondary | ICD-10-CM | POA: Insufficient documentation

## 2011-06-02 DIAGNOSIS — R279 Unspecified lack of coordination: Secondary | ICD-10-CM | POA: Insufficient documentation

## 2011-06-02 DIAGNOSIS — R197 Diarrhea, unspecified: Secondary | ICD-10-CM | POA: Insufficient documentation

## 2011-06-02 DIAGNOSIS — F3289 Other specified depressive episodes: Secondary | ICD-10-CM | POA: Insufficient documentation

## 2011-06-02 DIAGNOSIS — G8110 Spastic hemiplegia affecting unspecified side: Secondary | ICD-10-CM

## 2011-06-02 NOTE — Assessment & Plan Note (Signed)
REASON FOR VISIT:  Left shoulder pain.  HISTORY:  [name] who had a right pontine stroke with left hemiparesis.  She has finished up with home health therapy after graduating from [facility].  Her FMLA will be up on June 10, 2011.  She is still living with her parents because of 24-hour supervision needs.  She is not driving.  She is not using the left upper extremity for fine motor tasks, is able to dress and bathe herself and do some light household work.  She is cooking for herself.  Her meds include amlodipine, docusate sodium, acetaminophen, cyclobenzaprine, gabapentin, Wellbutrin.  Pain level about 5/10 left shoulder, particularly when she raises up her arm.  She walks 30 minutes at a time.  She climbs steps.  She is not driving as noted above.  REVIEW OF SYSTEMS:  Depression, anxiety, diarrhea, preexisting stress incontinence.  SOCIAL HISTORY:  Widowed.  She is living with her parents at the current time.  She may have one alcoholic beverage at night, but otherwise no other drug or alcohol use or smoking.  PHYSICAL EXAMINATION:  VITAL SIGNS:  Blood pressure 140/69, pulse 67, respirations 18, O2 sat 98% on room air. MUSCULOSKELETAL:  She has positive impingement sign, left shoulder and 90 degrees.  She has no pain with palpation over the shoulder area.  She has 4/5 strength in the left upper extremity including deltoid, biceps, triceps, and grip.  She has decreased fine motor coordination in the left hand.  Lower extremities have normal strength bilaterally.  She does have some swelling at the left ankle, but not at the right ankle. No erythema.  No tenderness.  IMPRESSION: 1. Left subacromial bursitis due to muscle imbalance from stroke. 2. She is not a good candidate for nonsteroidals given her recent     stroke.  She is not a diabetic, so corticosteroid injection would     be good idea at this point.  She has failed physical therapy. 3. Left  hemiparesis mainly confined to the left upper extremity at     this time.  I will set her up for some outpatient therapy.  I do     think she can return to her own apartment and live there     independently, still we will not allow her to drive until I see her     next month.  We will do some work simulation and OT and if she does     well with that, we can release her to work July 11, 2011.  Discussed with patient and her parents, agree with plan.  I will see her back in 1 month.     Erick Colace, M.D. Electronically Signed    AEK/MedQ D:  06/02/2011 12:43:55  T:  06/02/2011 21:56:48  Job #:  [id number]

## 2011-06-02 NOTE — Procedures (Signed)
NAMEJASMIN, Dana [name]                ACCOUNT NO.:  [id number]  MEDICAL RECORD NO.:  [id number]           PATIENT TYPE:  O  LOCATION:  TPC                          FACILITY:  [facility]  PHYSICIAN:  Erick Colace, M.D.DATE OF BIRTH:  25-Jan-1946  DATE OF PROCEDURE:  06/02/2011 DATE OF DISCHARGE:                              OPERATIVE REPORT  PROCEDURE:  Left subacromial bursa injection.  INDICATIONS:  Subacromial bursitis.  She is unable to take NSAIDs because of recent stroke.  Her pain is not responding to physical therapy.  DESCRIPTION OF PROCEDURE:  Informed consent was obtained after describing the risks and benefits of the procedure with the patient. These include bleeding, bruising, and infection.  She elects to proceed and has given written consent.  The patient was placed in a seated position.  Posterolateral approach utilized.  The area was marked and prepped with Betadine and alcohol.  A 25-gauge inch-and-half needle was used to do the injection.  After negative drawback for blood, I injected 1 mL of 40 mg/mL Depo-Medrol and 4 mL of 1% lidocaine.  The patient tolerated the procedure well.  Postprocedure instructions were given. May need to repeat in 1 month if not much better after some additional OT.     Erick Colace, M.D. Electronically Signed    AEK/MEDQ  D:  06/02/2011 12:40:41  T:  06/02/2011 22:19:53  Job:  [id number]

## 2011-06-17 ENCOUNTER — Ambulatory Visit: Payer: Medicare Other | Attending: Physical Medicine & Rehabilitation | Admitting: Occupational Therapy

## 2011-06-17 DIAGNOSIS — M25519 Pain in unspecified shoulder: Secondary | ICD-10-CM | POA: Insufficient documentation

## 2011-06-17 DIAGNOSIS — M6281 Muscle weakness (generalized): Secondary | ICD-10-CM | POA: Insufficient documentation

## 2011-06-17 DIAGNOSIS — R279 Unspecified lack of coordination: Secondary | ICD-10-CM | POA: Insufficient documentation

## 2011-06-17 DIAGNOSIS — IMO0001 Reserved for inherently not codable concepts without codable children: Secondary | ICD-10-CM | POA: Insufficient documentation

## 2011-06-29 ENCOUNTER — Ambulatory Visit: Payer: Medicare Other | Admitting: Occupational Therapy

## 2011-07-01 ENCOUNTER — Ambulatory Visit: Payer: Medicare Other | Admitting: Occupational Therapy

## 2011-07-03 ENCOUNTER — Encounter: Payer: Medicare Other | Attending: Physical Medicine & Rehabilitation

## 2011-07-03 ENCOUNTER — Ambulatory Visit (HOSPITAL_BASED_OUTPATIENT_CLINIC_OR_DEPARTMENT_OTHER): Payer: Medicare Other | Admitting: Physical Medicine & Rehabilitation

## 2011-07-03 DIAGNOSIS — G8110 Spastic hemiplegia affecting unspecified side: Secondary | ICD-10-CM

## 2011-07-03 DIAGNOSIS — F329 Major depressive disorder, single episode, unspecified: Secondary | ICD-10-CM | POA: Insufficient documentation

## 2011-07-03 DIAGNOSIS — R29898 Other symptoms and signs involving the musculoskeletal system: Secondary | ICD-10-CM | POA: Insufficient documentation

## 2011-07-03 DIAGNOSIS — I69959 Hemiplegia and hemiparesis following unspecified cerebrovascular disease affecting unspecified side: Secondary | ICD-10-CM | POA: Insufficient documentation

## 2011-07-03 DIAGNOSIS — F3289 Other specified depressive episodes: Secondary | ICD-10-CM | POA: Insufficient documentation

## 2011-07-03 DIAGNOSIS — R279 Unspecified lack of coordination: Secondary | ICD-10-CM | POA: Insufficient documentation

## 2011-07-03 DIAGNOSIS — S43429A Sprain of unspecified rotator cuff capsule, initial encounter: Secondary | ICD-10-CM

## 2011-07-03 DIAGNOSIS — R197 Diarrhea, unspecified: Secondary | ICD-10-CM | POA: Insufficient documentation

## 2011-07-03 NOTE — Assessment & Plan Note (Signed)
HISTORY:  [name] has right pontine stroke with left hemiparesis, primarily affecting left upper greater than left lower extremity.  Went through [facility]. She has just started her OT on outpatient basis.  FMLA up on June 10, 2011.  Still living with her parents.  She has returned to driving after getting the clearance from Dr. Pearlean Brownie.  She is not using left upper extremity for fine motor test but is able to dress and bathe herself and do some light household work.  She is cooking for herself.  MEDICATIONS:  Amlodipine, docusate sodium, acetaminophen, cyclobenzaprine, gabapentin, and Wellbutrin..  Pain level is 10/10 in the shoulder.  She states that is mainly when she raises overhead, she appears to be comfortable at the current time.  She had an injection left subacromial bursa, but this was not particularly helpful.  Walking tolerance is over 30 minutes.  REVIEW OF SYSTEMS:  Positive for depression anxiety, spasms, poor appetite, bladder control problems.  SOCIAL HISTORY:  Widowed.  Lives alone.  PHYSICAL EXAMINATION:  VITAL SIGNS:  Blood pressure is 132/63, pulse 72, respirations 18, and O2 sat 96% on room air. GENERAL:  No acute stress.  Orientation x3.  Affect alert.  Gait is normal. MUSCULOSKELETAL:  She has no pain to palpation over the shoulder area. She has 4-/5 in left deltoid, 4 in the biceps, triceps, and grip.  She has decreased fine motor coordination left hand.  She has normal sensation in left upper extremity.  Upper extremity on the right side is normal.  Lower extremity on the right side is normal.  Lower extremity on the left side has 4/5 ankle dorsiflexor, otherwise 5.  IMPRESSION:  Left shoulder pain post-stroke, this is likely multifactorial.  It does not respond well to subacromial bursa injection.  I think she may be a good candidate for ultrasound more fully evaluate for rotator cuff tear versus evidence of  dynamic impingement plus/minus AC joint arthropathy given pain with crossed adduction.  We will trial her on some Voltaren gel.  She will continue with OT.  At the current time, I do not think she can return to work.  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  I have written a note for this.  I hope that OT can do some work simulation for her.     Erick Colace, M.D. Electronically Signed    AEK/MedQ D:  07/03/2011 11:35:03  T:  07/03/2011 14:04:19  Job #:  [id number]  cc:   Pramod P. Pearlean Brownie, MD Fax: [phone]  [facility]

## 2011-07-06 ENCOUNTER — Ambulatory Visit: Payer: Medicare Other | Admitting: Occupational Therapy

## 2011-07-09 ENCOUNTER — Encounter: Payer: Medicare Other | Admitting: Occupational Therapy

## 2011-07-10 ENCOUNTER — Ambulatory Visit: Payer: Medicare Other | Admitting: Occupational Therapy

## 2011-07-14 ENCOUNTER — Ambulatory Visit: Payer: Medicare Other | Attending: Physical Medicine & Rehabilitation | Admitting: Occupational Therapy

## 2011-07-14 DIAGNOSIS — IMO0001 Reserved for inherently not codable concepts without codable children: Secondary | ICD-10-CM | POA: Insufficient documentation

## 2011-07-14 DIAGNOSIS — M6281 Muscle weakness (generalized): Secondary | ICD-10-CM | POA: Insufficient documentation

## 2011-07-14 DIAGNOSIS — R279 Unspecified lack of coordination: Secondary | ICD-10-CM | POA: Insufficient documentation

## 2011-07-14 DIAGNOSIS — M25519 Pain in unspecified shoulder: Secondary | ICD-10-CM | POA: Insufficient documentation

## 2011-07-16 ENCOUNTER — Ambulatory Visit: Payer: Medicare Other | Admitting: Occupational Therapy

## 2011-07-17 ENCOUNTER — Encounter: Payer: Medicare Other | Attending: Physical Medicine & Rehabilitation

## 2011-07-17 ENCOUNTER — Ambulatory Visit (HOSPITAL_BASED_OUTPATIENT_CLINIC_OR_DEPARTMENT_OTHER): Payer: Medicare Other | Admitting: Physical Medicine & Rehabilitation

## 2011-07-17 DIAGNOSIS — R197 Diarrhea, unspecified: Secondary | ICD-10-CM | POA: Insufficient documentation

## 2011-07-17 DIAGNOSIS — I69959 Hemiplegia and hemiparesis following unspecified cerebrovascular disease affecting unspecified side: Secondary | ICD-10-CM | POA: Insufficient documentation

## 2011-07-17 DIAGNOSIS — F329 Major depressive disorder, single episode, unspecified: Secondary | ICD-10-CM | POA: Insufficient documentation

## 2011-07-17 DIAGNOSIS — M75100 Unspecified rotator cuff tear or rupture of unspecified shoulder, not specified as traumatic: Secondary | ICD-10-CM

## 2011-07-17 DIAGNOSIS — F3289 Other specified depressive episodes: Secondary | ICD-10-CM | POA: Insufficient documentation

## 2011-07-17 DIAGNOSIS — M7520 Bicipital tendinitis, unspecified shoulder: Secondary | ICD-10-CM

## 2011-07-17 DIAGNOSIS — R279 Unspecified lack of coordination: Secondary | ICD-10-CM | POA: Insufficient documentation

## 2011-07-17 DIAGNOSIS — R29898 Other symptoms and signs involving the musculoskeletal system: Secondary | ICD-10-CM | POA: Insufficient documentation

## 2011-07-17 NOTE — Procedures (Signed)
Dana [name], Dana [name]                ACCOUNT NO.:  [id number]  MEDICAL RECORD NO.:  [id number]           PATIENT TYPE:  O  LOCATION:  TPC                          FACILITY:  [facility]  PHYSICIAN:  Erick Colace, M.D.DATE OF BIRTH:  07-02-1946  DATE OF PROCEDURE:  07/17/2011 DATE OF DISCHARGE:                              OPERATIVE REPORT  PROCEDURE:  This is an ultrasound of the left shoulder.  INDICATION:  Left shoulder pain, status post CVA with weakness.  The patient in seated position, 12 Hz transducer utilized.  The short axis views of the left biceps tendon showed no evidence of subluxation. Long axis views of the left biceps tendon showed thinning of the tendon with some surrounding fluid.  Supraspinatus views demonstrated evidence of articular surface cuff tear, bursal surface was intact.  There was evidence of cortical irregularity both on long-axis and especially on short axis views. Dynamic views were difficult to obtain secondary to the patient's thin anatomy and maintaining contact of transducer during movement.  Infraspinatus in long axis and short axis views was intact and normal. AC joint appeared to have cortical irregularity, but no evidence of fluid or disruption of AC joint ligament.  IMPRESSION: 1. Left biceps tendon tenosynovitis. 2. Left supraspinatus articular surface tear, partial. 3. Explained findings to the patient.  Please refer to the saved     images for actual measurements.     Erick Colace, M.D. Electronically Signed    AEK/MEDQ  D:  07/17/2011 10:26:18  T:  07/17/2011 13:56:39  Job:  [id number]

## 2011-07-22 ENCOUNTER — Ambulatory Visit: Payer: Medicare Other | Admitting: Occupational Therapy

## 2011-07-30 ENCOUNTER — Ambulatory Visit: Payer: Medicare Other | Admitting: Occupational Therapy

## 2011-08-05 ENCOUNTER — Ambulatory Visit: Payer: Medicare Other | Admitting: Occupational Therapy

## 2011-08-11 ENCOUNTER — Ambulatory Visit: Payer: Medicare Other | Attending: Physical Medicine & Rehabilitation | Admitting: Occupational Therapy

## 2011-08-11 DIAGNOSIS — R279 Unspecified lack of coordination: Secondary | ICD-10-CM | POA: Insufficient documentation

## 2011-08-11 DIAGNOSIS — IMO0001 Reserved for inherently not codable concepts without codable children: Secondary | ICD-10-CM | POA: Insufficient documentation

## 2011-08-11 DIAGNOSIS — M25519 Pain in unspecified shoulder: Secondary | ICD-10-CM | POA: Insufficient documentation

## 2011-08-11 DIAGNOSIS — M6281 Muscle weakness (generalized): Secondary | ICD-10-CM | POA: Insufficient documentation

## 2011-08-17 ENCOUNTER — Encounter: Payer: Medicare Other | Attending: Physical Medicine & Rehabilitation

## 2011-08-17 ENCOUNTER — Ambulatory Visit (HOSPITAL_BASED_OUTPATIENT_CLINIC_OR_DEPARTMENT_OTHER): Payer: Medicare Other | Admitting: Physical Medicine & Rehabilitation

## 2011-08-17 DIAGNOSIS — F3289 Other specified depressive episodes: Secondary | ICD-10-CM | POA: Insufficient documentation

## 2011-08-17 DIAGNOSIS — M7520 Bicipital tendinitis, unspecified shoulder: Secondary | ICD-10-CM

## 2011-08-17 DIAGNOSIS — F329 Major depressive disorder, single episode, unspecified: Secondary | ICD-10-CM | POA: Insufficient documentation

## 2011-08-17 DIAGNOSIS — R29898 Other symptoms and signs involving the musculoskeletal system: Secondary | ICD-10-CM | POA: Insufficient documentation

## 2011-08-17 DIAGNOSIS — R279 Unspecified lack of coordination: Secondary | ICD-10-CM | POA: Insufficient documentation

## 2011-08-17 DIAGNOSIS — R197 Diarrhea, unspecified: Secondary | ICD-10-CM | POA: Insufficient documentation

## 2011-08-17 DIAGNOSIS — I69959 Hemiplegia and hemiparesis following unspecified cerebrovascular disease affecting unspecified side: Secondary | ICD-10-CM | POA: Insufficient documentation

## 2011-08-17 NOTE — Procedures (Signed)
[name], [name]                ACCOUNT NO.:  [id number]  MEDICAL RECORD NO.:  [id number]           PATIENT TYPE:  O  LOCATION:  TPC                          FACILITY:  [facility]  PHYSICIAN:  Erick Colace, M.D.DATE OF BIRTH:  04/21/1946  DATE OF PROCEDURE:  08/17/2011 DATE OF DISCHARGE:                              OPERATIVE REPORT  PROCEDURE:  Left occipital tendon injection under ultrasound guidance.  INDICATION:  Left shoulder pain, status post CVA with weakness, pain is only partially response to medication management.  She has had ultrasound of the left shoulder demonstrated biceps tenosynovitis.  Informed consent was obtained after describing risks and benefits of the procedure with the patient.  These include bleeding, bruising, and infection.  She elects to proceed.  Area was preoperative-scanned.  It was marked and prepped with Betadine after confirming proper site and the patient.  She had the area infiltrated with 1 mL of 1% lidocaine using 25 gauge and 1/2 needle, and a 40-mm echo block needle was inserted under direct ultrasound visualization starting anterior aspect of tendon sheath.  Once this was achieved under direct visualization, 1 mL of 40 mg/mL Depo-Medrol, 3 mL of 1% lidocaine were injected.  The patient tolerated the procedure well.  Postoperative instructions give.     Erick Colace, M.D. Electronically Signed    AEK/MEDQ  D:  08/17/2011 11:56:47  T:  08/17/2011 17:55:29  Job:  [id number]

## 2011-09-14 ENCOUNTER — Encounter: Payer: Medicare Other | Attending: Physical Medicine & Rehabilitation

## 2011-09-14 ENCOUNTER — Ambulatory Visit (HOSPITAL_BASED_OUTPATIENT_CLINIC_OR_DEPARTMENT_OTHER): Payer: Medicare Other | Admitting: Physical Medicine & Rehabilitation

## 2011-09-14 DIAGNOSIS — R29898 Other symptoms and signs involving the musculoskeletal system: Secondary | ICD-10-CM | POA: Insufficient documentation

## 2011-09-14 DIAGNOSIS — F329 Major depressive disorder, single episode, unspecified: Secondary | ICD-10-CM | POA: Insufficient documentation

## 2011-09-14 DIAGNOSIS — R197 Diarrhea, unspecified: Secondary | ICD-10-CM | POA: Insufficient documentation

## 2011-09-14 DIAGNOSIS — R279 Unspecified lack of coordination: Secondary | ICD-10-CM | POA: Insufficient documentation

## 2011-09-14 DIAGNOSIS — I69959 Hemiplegia and hemiparesis following unspecified cerebrovascular disease affecting unspecified side: Secondary | ICD-10-CM | POA: Insufficient documentation

## 2011-09-14 DIAGNOSIS — F3289 Other specified depressive episodes: Secondary | ICD-10-CM | POA: Insufficient documentation

## 2011-09-14 DIAGNOSIS — G8110 Spastic hemiplegia affecting unspecified side: Secondary | ICD-10-CM

## 2011-09-14 NOTE — Assessment & Plan Note (Signed)
REASON FOR VISIT:  Left-sided weakness.  HISTORY:  [name] who had a right pontine infarct.  Her stroke was on May 1, she went through inpatient rehab.  She initially was living with her parents, but now was living independently.  She used to work as a Water engineer, but they will not let her come back until she is able to be 100%.  She was having left shoulder pain for couple of months post stroke, we checked ultrasound and turned out to be bicipital tendon tenosynovitis which resolved after tendon sheath injection performed on August 17, 2011.  She also had left supraspinatus articular surface partial tear, but this certainly did not seem to be bothering her today.  PHYSICAL EXAMINATION:  GENERAL:  No acute distress.  Mood and affect appropriate. MUSCULOSKELETAL:  Left shoulder no pain with range of motion.  Does have limited external rotation.  Negative impingement sign.  Motor strength is 4/5 on the left side in the deltoid, biceps, triceps, grip as well as hip flexion, knee extension, ankle dorsiflexion, 5/5 on the right side.  IMPRESSION: 1. Right pontine infarct left hemiparesis. 2. Bicipital tendinitis, improved.  PLAN: 1. We will hold off on any type for reinjection. 2. See her back in 2 months. 3. May resume job at [employer]. 4. Repeat injection if pain returns. 5. P.r.n. use of Voltaren gel to the left shoulder.     Erick Colace, M.D. Electronically Signed    AEK/MedQ D:  09/14/2011 11:51:16  T:  09/14/2011 12:21:03  Job #:  [id number]  cc:   Juluis Rainier, M.D. Fax: [phone]  Pramod P. Pearlean Brownie, MD Fax: [phone]

## 2011-11-13 ENCOUNTER — Ambulatory Visit: Payer: Medicare Other | Admitting: Physical Medicine & Rehabilitation

## 2011-11-16 ENCOUNTER — Encounter: Payer: Medicare Other | Attending: Physical Medicine & Rehabilitation

## 2011-11-16 ENCOUNTER — Ambulatory Visit (HOSPITAL_BASED_OUTPATIENT_CLINIC_OR_DEPARTMENT_OTHER): Payer: Medicare Other | Admitting: Physical Medicine & Rehabilitation

## 2011-11-16 DIAGNOSIS — R279 Unspecified lack of coordination: Secondary | ICD-10-CM | POA: Insufficient documentation

## 2011-11-16 DIAGNOSIS — I69959 Hemiplegia and hemiparesis following unspecified cerebrovascular disease affecting unspecified side: Secondary | ICD-10-CM | POA: Insufficient documentation

## 2011-11-16 DIAGNOSIS — F3289 Other specified depressive episodes: Secondary | ICD-10-CM | POA: Insufficient documentation

## 2011-11-16 DIAGNOSIS — R29898 Other symptoms and signs involving the musculoskeletal system: Secondary | ICD-10-CM | POA: Insufficient documentation

## 2011-11-16 DIAGNOSIS — F329 Major depressive disorder, single episode, unspecified: Secondary | ICD-10-CM | POA: Insufficient documentation

## 2011-11-16 DIAGNOSIS — R197 Diarrhea, unspecified: Secondary | ICD-10-CM | POA: Insufficient documentation

## 2011-11-16 DIAGNOSIS — G8110 Spastic hemiplegia affecting unspecified side: Secondary | ICD-10-CM

## 2011-11-16 NOTE — Assessment & Plan Note (Signed)
REASON FOR VISIT:  Left-sided weakness due to stroke.  HISTORY:  A 66 year old female who had a right pontine infarct on Mar 10, 2011, went through inpatient rehab then outpatient rehab.  She is now living independently.  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  She is driving clients, but because of her residual weakness and balance problem, she is not doing all her usual duties. Her shoulder pain is not present anymore.  She was going to resume her job at [employer], but this would make her work 7 days a week.  This is what it took for her to pay her bills, now she is just working 2 of her part-time jobs, but states that she may not be able to pay her mortgage this month.  REVIEW OF SYSTEMS:  Depression, anxiety and weight gain.  Other physicians include Dr. Juluis Rainier.  PHYSICAL EXAMINATION:  VITAL SIGNS:  Blood pressure 130/52, pulse 81, respiratory rate is 16 and O2 sat 95% on room air.  Weight 154 pounds, height 5 feet 4 inches. GENERAL:  Well-developed, well-nourished female, in no acute distress. Mood and affect are appropriate. EXTREMITIES:  She has normal range of motion, left shoulder with abduction, but some limited external rotation.  Negative impingement sign.  Motor strength is 4/5 in left deltoid, biceps, triceps, grips, as well as hip flexion, knee extension, ankle dorsiflexion and 5/5 on the right side.  Her dynamic balance is reduced when she turns.  She tends to stumble.  IMPRESSION: 1. Chronic left hemiparesis due to right pontine infarct 2. Left bicipital tendonitis, improved.  PLAN: 1. We will reinject as needed. 2. I will see her back in about 2 months' time 3. As I discussed with the patient, I really cannot release her to     full 100% duty, given the fact she may have to get somebody about     the fall in working situation which I do not think she can do     reliably.     Dana [name], M.D. Electronically  Signed    AEK/MedQ D:  11/16/2011 14:43:58  T:  11/16/2011 19:42:45  Job #:  [id number]  cc:   Juluis Rainier, M.D. Fax: [phone]

## 2012-01-12 ENCOUNTER — Encounter: Payer: Medicare Other | Attending: Physical Medicine & Rehabilitation

## 2012-01-12 ENCOUNTER — Encounter: Payer: Self-pay | Admitting: Physical Medicine & Rehabilitation

## 2012-01-12 ENCOUNTER — Ambulatory Visit (HOSPITAL_BASED_OUTPATIENT_CLINIC_OR_DEPARTMENT_OTHER): Payer: Medicare Other | Admitting: Physical Medicine & Rehabilitation

## 2012-01-12 VITALS — BP 116/59 | HR 78 | Resp 16 | Ht 62.75 in | Wt 150.0 lb

## 2012-01-12 DIAGNOSIS — R279 Unspecified lack of coordination: Secondary | ICD-10-CM | POA: Insufficient documentation

## 2012-01-12 DIAGNOSIS — R29898 Other symptoms and signs involving the musculoskeletal system: Secondary | ICD-10-CM | POA: Insufficient documentation

## 2012-01-12 DIAGNOSIS — F3289 Other specified depressive episodes: Secondary | ICD-10-CM | POA: Insufficient documentation

## 2012-01-12 DIAGNOSIS — R197 Diarrhea, unspecified: Secondary | ICD-10-CM | POA: Insufficient documentation

## 2012-01-12 DIAGNOSIS — G8110 Spastic hemiplegia affecting unspecified side: Secondary | ICD-10-CM | POA: Insufficient documentation

## 2012-01-12 DIAGNOSIS — M7522 Bicipital tendinitis, left shoulder: Secondary | ICD-10-CM

## 2012-01-12 DIAGNOSIS — I69959 Hemiplegia and hemiparesis following unspecified cerebrovascular disease affecting unspecified side: Secondary | ICD-10-CM | POA: Insufficient documentation

## 2012-01-12 DIAGNOSIS — F329 Major depressive disorder, single episode, unspecified: Secondary | ICD-10-CM | POA: Insufficient documentation

## 2012-01-12 NOTE — Patient Instructions (Signed)
Stroke (Cerebrovascular Accident) A stroke (cerebrovascular accident, CVA) means you have a brain injury from blocked circulation or bleeding in the brain. Blocked circulation usually comes from a clot. RISK FACTORS  High blood pressure (hypertension).   High cholesterol.   Diabetes.   Heart disease.   The buildup of fatty deposits in the blood vessels (peripheral artery disease or atherosclerosis).   An abnormal heart rhythm (atrial fibrillation).   Obesity.   Smoking.   Taking oral contraceptives (especially in combination with smoking).   Physical inactivity.   A diet high in fats, salt (sodium), and calories.   Alcohol use.   Use of illegal drugs (especially cocaine and methamphetamine).   Being a female.   Being an African American.   Age over 55.   Family history of stroke.   Previous history of blood clots, a "warning stroke" (transient ischemic attack, TIA), or heart attack.   Sickle cell disease.  SYMPTOMS  The symptoms of a stroke depend on the part of the brain that is affected. It is important to seek treatment within 4 hours of the start of symptoms because you may receive a "clot dissolving" medication that cannot be given after that time. Even if you don't know when your symptoms began, get treatment as soon as possible. Symptoms of a stroke may progress or change over the first several days. Symptoms may include:  Sudden weakness or numbness of the face, arm, or leg, especially on one side of the body.   Sudden confusion.   Trouble speaking (aphasia) or understanding.   Sudden trouble seeing in one or both eyes.   Sudden trouble walking.   Dizziness.   Loss of balance or coordination.   Sudden severe headache with no known cause.  HOME CARE INSTRUCTIONS   Medicines: Aspirin and blood thinners may be used to prevent another stroke. Blood thinners need to be used exactly as instructed. Medicines may also be used to control risk factors for a  stroke. Be sure you understand all your medicine instructions.   Diet: Certain diets may be prescribed to address high blood pressure, high cholesterol, diabetes, or obesity. A diet that includes 5 or more servings of fruits and vegetables a day may reduce the risk of stroke. Foods may need to be a special consistency (soft or pureed), or small bites may need to be taken in order to avoid aspirating or choking.   Maintain a healthy weight.   Stay physically active. It is recommended that you get at least 30 minutes of activity on most or all days.   Do not smoke.   Limit alcohol use.   Stop drug abuse.   Home safety: A safe home environment is important to reduce the risk of falls. Your caregiver may arrange for specialists to evaluate your home. Having grab bars in the bedroom and bathroom is often important. Your caregiver may arrange for special equipment to be used at home, such as raised toilets and a seat for the shower.   Physical, occupational, and speech therapy: Ongoing therapy may be needed to maximize your recovery after a stroke. If you have been advised to use a walker or a cane, use it at all times. Be sure to keep your therapy appointments.   Follow all instructions for follow-up with your caregiver. This is VERY important. This includes any referrals, physical therapy, rehabilitation, and laboratory tests. Proper treatment also prevents another stroke from occurring.  SEEK IMMEDIATE MEDICAL CARE IF:   You have   sudden weakness or numbness of the face, arm, or leg, especially on one side of the body.   You have sudden confusion.   You have trouble speaking (aphasia) or understanding.   You have sudden trouble seeing in one or both eyes.   You have sudden trouble walking.   You have dizziness.   You have loss of balance or coordination.   You have a sudden, severe headache with no known cause.   You have a fever.   You are coughing or have difficulty breathing.    You have new chest pain, angina, or an irregular heartbeat.  Any of these symptoms may represent a serious problem that is an emergency. Do not wait to see if the symptoms will go away. Get medical help at once. Call your local emergency services (911 in [place] Do not drive yourself to the hospital. Document Released: 10/26/2005 Document Revised: 05/11/2011 Document Reviewed: 03/26/2010 Exit[employer] Patient Information 2012 [employer]. 

## 2012-01-12 NOTE — Progress Notes (Signed)
  Subjective:    Patient ID: Dana [name], female    DOB: [birth year], 66 y.o.   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 Has been working at [employer] as well roughly 10 hours per week. No longer working at [employer] Treated for bladder infection recently.  Has been assisting her family during hospitalizations Pain Inventory Average Pain 0 Pain Right Now 0 My pain is no pain  In the last 24 hours, has pain interfered with the following? General activity 0 Relation with others 0 Enjoyment of life 0 What TIME of day is your pain at its worst? no pain Sleep (in general) Fair  Pain is worse with: 0 Pain improves with: n/a Relief from Meds: n/a  Mobility walk without assistance how many minutes can you walk? as much as I want ability to climb steps?  yes do you drive?  yes Do you have any goals in this area?  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  yes  Neuro/Psych depression anxiety  Prior Studies Any changes since last visit?  no  Physicians involved in your care Any changes since last visit?  no Primary care Juluis Rainier Neurologist Sethi      Review of Systems  Constitutional: Positive for unexpected weight change.       Weight gain  HENT: Negative.   Eyes: Negative.   Respiratory: Negative.   Cardiovascular: Negative.   Gastrointestinal: Negative.   Genitourinary: Negative.   Musculoskeletal: Negative.   Neurological: Positive for weakness.       Much improved on left side  Hematological: Negative.   Psychiatric/Behavioral: Positive for dysphoric mood. The patient is nervous/anxious.        Objective:   Physical Exam  Constitutional: She is oriented to person, place, and time.  Neurological: She is alert and oriented to person, place, and time. No cranial nerve deficit or sensory deficit. She displays a negative Romberg sign. Gait  abnormal.  Reflex Scores:      Tricep reflexes are 1+ on the right side and 3+ on the left side.      Bicep reflexes are 1+ on the right side and 3+ on the left side.      Brachioradialis reflexes are 1+ on the right side and 3+ on the left side.      Patellar reflexes are 1+ on the right side and 3+ on the left side.      Achilles reflexes are 2+ on the left side.      Motor strength is 4+/5 in the left deltoid, biceps, triceps, grip 5/5 in the right deltoid, biceps, triceps, grip 5/5 in bilateral lower extremities  Decreased tandem gait. Normal toe walking.      Assessment & Plan:  1. Left hemiparesis due to to pontine infarct improving strength particularly in the lower extremity. 2. Left bicipital tendinitis resolved. 3. Work restrictions will be lifted due to improvements in physical functioning. Will give a work note 4. Return to clinic in 2 months for recheck

## 2012-01-22 HISTORY — PX: FRACTURE SURGERY: SHX138

## 2012-01-26 ENCOUNTER — Ambulatory Visit
Admission: RE | Admit: 2012-01-26 | Discharge: 2012-01-26 | Disposition: A | Discharge status: Home / Self Care | Payer: Medicare Other | Source: Ambulatory Visit | Attending: Ophthalmology | Admitting: Ophthalmology

## 2012-01-26 ENCOUNTER — Other Ambulatory Visit: Payer: Self-pay | Admitting: Ophthalmology

## 2012-01-26 DIAGNOSIS — H532 Diplopia: Secondary | ICD-10-CM

## 2012-01-26 DIAGNOSIS — S0230XA Fracture of orbital floor, unspecified side, initial encounter for closed fracture: Secondary | ICD-10-CM

## 2012-02-09 ENCOUNTER — Encounter: Payer: Self-pay | Admitting: Physical Medicine & Rehabilitation

## 2012-03-15 ENCOUNTER — Encounter: Payer: Self-pay | Admitting: Physical Medicine & Rehabilitation

## 2012-03-15 ENCOUNTER — Encounter: Payer: Medicare Other | Attending: Physical Medicine & Rehabilitation

## 2012-03-15 ENCOUNTER — Ambulatory Visit (HOSPITAL_BASED_OUTPATIENT_CLINIC_OR_DEPARTMENT_OTHER): Payer: Medicare Other | Admitting: Physical Medicine & Rehabilitation

## 2012-03-15 VITALS — BP 138/53 | HR 73 | Resp 16 | Ht 64.5 in | Wt 158.6 lb

## 2012-03-15 DIAGNOSIS — F329 Major depressive disorder, single episode, unspecified: Secondary | ICD-10-CM | POA: Insufficient documentation

## 2012-03-15 DIAGNOSIS — R279 Unspecified lack of coordination: Secondary | ICD-10-CM | POA: Insufficient documentation

## 2012-03-15 DIAGNOSIS — I69959 Hemiplegia and hemiparesis following unspecified cerebrovascular disease affecting unspecified side: Secondary | ICD-10-CM | POA: Insufficient documentation

## 2012-03-15 DIAGNOSIS — R29898 Other symptoms and signs involving the musculoskeletal system: Secondary | ICD-10-CM | POA: Insufficient documentation

## 2012-03-15 DIAGNOSIS — R197 Diarrhea, unspecified: Secondary | ICD-10-CM | POA: Insufficient documentation

## 2012-03-15 DIAGNOSIS — G8110 Spastic hemiplegia affecting unspecified side: Secondary | ICD-10-CM

## 2012-03-15 DIAGNOSIS — F3289 Other specified depressive episodes: Secondary | ICD-10-CM | POA: Insufficient documentation

## 2012-03-15 NOTE — Progress Notes (Signed)
  Subjective:    Patient ID: Dana [name], female    DOB: 12/03/1945, 66 y.o.   MRN: [id number]  HPI  Patient tripped over her cat and had a facial fracture inferior to the left eye. Has seen an ophthalmologist. She has a followup appointment at the end of the month. She is currently having double vision. 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 Pain Inventory Average Pain 0 Pain Right Now 0 My pain is n/a  In the last 24 hours, has pain interfered with the following? General activity 0 Relation with others 0 Enjoyment of life 0 What TIME of day is your pain at its worst? n/a Sleep (in general) Fair  Pain is worse with: n/a Pain improves with: n/a Relief from Meds: n/a  Mobility walk without assistance how many minutes can you walk? not limited ability to climb steps?  yes do you drive?  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  Psychiatric/Behavioral: Positive for dysphoric mood.  All other systems reviewed and are negative.       Objective:   Physical Exam  Constitutional: She is oriented to person, place, and time. She appears well-developed and well-nourished.  Neurological: She is alert and oriented to person, place, and time. She displays no atrophy. No sensory deficit. She exhibits normal muscle tone. Gait normal.        Left upper extremity strength is 4/5 in the deltoid, biceps, triceps, grip 5/5 on the right side. Lower extremities are 5/5 bilaterally.  Psychiatric: She has a normal mood and affect.          Assessment & Plan:  1. CVA with residual mild left upper extremity weakness. No further physical therapy needed. Continue with work activities. See the patient back in 3-4 months  and after that on an as-needed basis.  2.Diplopia secondary to facial trauma. Followup with ophthalmologist. She has been okayed to drive by her ophthalmologist.

## 2012-03-15 NOTE — Patient Instructions (Signed)
Continue with current work activities Followup with the ophthalmologist in regards to your double vision

## 2012-07-15 ENCOUNTER — Encounter: Payer: Self-pay | Admitting: Physical Medicine & Rehabilitation

## 2012-07-15 ENCOUNTER — Encounter: Payer: Medicare Other | Attending: Physical Medicine & Rehabilitation

## 2012-07-15 ENCOUNTER — Ambulatory Visit (HOSPITAL_BASED_OUTPATIENT_CLINIC_OR_DEPARTMENT_OTHER): Payer: Medicare Other | Admitting: Physical Medicine & Rehabilitation

## 2012-07-15 VITALS — BP 120/57 | HR 61 | Resp 12 | Ht 64.5 in | Wt 153.6 lb

## 2012-07-15 DIAGNOSIS — I10 Essential (primary) hypertension: Secondary | ICD-10-CM | POA: Insufficient documentation

## 2012-07-15 DIAGNOSIS — G8110 Spastic hemiplegia affecting unspecified side: Secondary | ICD-10-CM

## 2012-07-15 DIAGNOSIS — I878 Other specified disorders of veins: Secondary | ICD-10-CM

## 2012-07-15 DIAGNOSIS — R29898 Other symptoms and signs involving the musculoskeletal system: Secondary | ICD-10-CM | POA: Insufficient documentation

## 2012-07-15 DIAGNOSIS — I69998 Other sequelae following unspecified cerebrovascular disease: Secondary | ICD-10-CM | POA: Insufficient documentation

## 2012-07-15 DIAGNOSIS — E785 Hyperlipidemia, unspecified: Secondary | ICD-10-CM | POA: Insufficient documentation

## 2012-07-15 DIAGNOSIS — I872 Venous insufficiency (chronic) (peripheral): Secondary | ICD-10-CM

## 2012-07-15 NOTE — Patient Instructions (Signed)
You have a written prescription for your compression hose Perform your ankle dorsiflexion exercises on a daily basis He'll see me back on an as-needed basis

## 2012-07-15 NOTE — Progress Notes (Signed)
  Subjective:    Patient ID: Dana [name], female    DOB: Dec 17, 1945, 66 y.o.   MRN: [id number]  HPI Circumduct and gait during steps. Ambulating for work activities Driving without difficulties Return to work 40 hours per week. Lower extremity swelling continues requiring compression stocking Pain Inventory Average Pain 0 Pain Right Now 0 My pain is no pain  In the last 24 hours, has pain interfered with the following? General activity 0 Relation with others 0 Enjoyment of life 0 What TIME of day is your pain at its worst? no pain Sleep (in general) Fair  Pain is worse with: no pain Pain improves with: no pain Relief from Meds: no pain  Mobility walk without assistance ability to climb steps?  yes do you drive?  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  no  Physicians involved in your care Any changes since last visit?  no   Family History  Problem Relation Age of Onset  . Hyperlipidemia Mother   . Hypertension Mother   . Diabetes Mother   . Heart disease Mother     aortic vavle  replaced  . Hypertension Father   . Hyperlipidemia Father   . Heart disease Father     aortic valve replaced  . Alzheimer's disease Father    History   Social History  . Marital Status: Widowed    Spouse Name: N/A    Number of Children: N/A  . Years of Education: N/A   Social History Main Topics  . Smoking status: Never Smoker   . Smokeless tobacco: Never Used  . Alcohol Use: None  . Drug Use: None  . Sexually Active: None   Other Topics Concern  . None   Social History Narrative  . None   Past Surgical History  Procedure Date  . Abdominal hysterectomy   . Fracture surgery 01/22/12    fx of eye socket from fall   Past Medical History  Diagnosis Date  . Stroke   . Anemia   . Arthritis   . Hypertension   . Hyperlipidemia    BP 120/57  Pulse 61  Resp 12  Ht  5' 4.5" (1.638 m)  Wt 153 lb 9.6 oz (69.673 kg)  BMI 25.96 kg/m2  SpO2 97%    Review of Systems  Psychiatric/Behavioral: Positive for dysphoric mood.       Objective:   Physical Exam   Constitutional: She is oriented to person, place, and time. She appears well-developed and well-nourished.  Neurological: She is alert and oriented to person, place, and time. She displays no atrophy. No sensory deficit. She exhibits normal muscle tone. Gait normal.  Left upper extremity strength is 4/5 in the deltoid, biceps, triceps, grip 5/5 on the right side. Lower extremities are 5/5 bilaterally. Except 4/5 in the left ankle dorsiflexor Psychiatric: She has a normal mood and affect.       Assessment & Plan:  1. CVA with residual mild left upper extremity weakness. No further physical therapy needed. Continue with work activities. See the patient back  on an as-needed basis. Ankle dorsiflexion exercises recommended Prescription for bilateral knee-high compression hose given

## 2013-06-28 ENCOUNTER — Ambulatory Visit (INDEPENDENT_AMBULATORY_CARE_PROVIDER_SITE_OTHER): Payer: Self-pay | Admitting: *Deleted

## 2013-06-28 DIAGNOSIS — I639 Cerebral infarction, unspecified: Secondary | ICD-10-CM

## 2013-06-28 DIAGNOSIS — I6350 Cerebral infarction due to unspecified occlusion or stenosis of unspecified cerebral artery: Secondary | ICD-10-CM

## 2013-06-29 NOTE — Progress Notes (Signed)
This participant was in the office for her 2nd. Annual Visit for the IRIS Study. MMSE was completed successfully.  Blood was drawn and shipped. Participant to continue with follow-up visit per IRIS protocol.

## 2013-09-20 ENCOUNTER — Other Ambulatory Visit: Payer: Self-pay | Admitting: Gastroenterology

## 2013-11-28 ENCOUNTER — Encounter (HOSPITAL_COMMUNITY): Payer: Self-pay

## 2013-11-28 ENCOUNTER — Ambulatory Visit (HOSPITAL_COMMUNITY): Admit: 2013-11-28 | Payer: Self-pay | Admitting: Gastroenterology

## 2013-11-28 SURGERY — COLONOSCOPY WITH PROPOFOL
Anesthesia: Monitor Anesthesia Care

## 2014-09-15 ENCOUNTER — Emergency Department (HOSPITAL_COMMUNITY): Payer: Medicare Other

## 2014-09-15 ENCOUNTER — Inpatient Hospital Stay (HOSPITAL_COMMUNITY)
Admission: EM | Admit: 2014-09-15 | Discharge: 2014-09-18 | DRG: 470 | Disposition: A | Discharge status: Skilled Nursing Facility | Payer: Medicare Other | Attending: Internal Medicine | Admitting: Internal Medicine

## 2014-09-15 ENCOUNTER — Encounter (HOSPITAL_COMMUNITY): Payer: Self-pay | Admitting: Emergency Medicine

## 2014-09-15 DIAGNOSIS — M1990 Unspecified osteoarthritis, unspecified site: Secondary | ICD-10-CM | POA: Diagnosis present

## 2014-09-15 DIAGNOSIS — Z885 Allergy status to narcotic agent status: Secondary | ICD-10-CM | POA: Diagnosis not present

## 2014-09-15 DIAGNOSIS — Z79899 Other long term (current) drug therapy: Secondary | ICD-10-CM

## 2014-09-15 DIAGNOSIS — Z8673 Personal history of transient ischemic attack (TIA), and cerebral infarction without residual deficits: Secondary | ICD-10-CM | POA: Diagnosis not present

## 2014-09-15 DIAGNOSIS — S72009A Fracture of unspecified part of neck of unspecified femur, initial encounter for closed fracture: Secondary | ICD-10-CM | POA: Insufficient documentation

## 2014-09-15 DIAGNOSIS — M545 Low back pain: Secondary | ICD-10-CM | POA: Diagnosis present

## 2014-09-15 DIAGNOSIS — E785 Hyperlipidemia, unspecified: Secondary | ICD-10-CM | POA: Diagnosis present

## 2014-09-15 DIAGNOSIS — W1839XA Other fall on same level, initial encounter: Secondary | ICD-10-CM | POA: Diagnosis present

## 2014-09-15 DIAGNOSIS — Z7982 Long term (current) use of aspirin: Secondary | ICD-10-CM

## 2014-09-15 DIAGNOSIS — M549 Dorsalgia, unspecified: Secondary | ICD-10-CM

## 2014-09-15 DIAGNOSIS — M62838 Other muscle spasm: Secondary | ICD-10-CM | POA: Diagnosis present

## 2014-09-15 DIAGNOSIS — S72002A Fracture of unspecified part of neck of left femur, initial encounter for closed fracture: Secondary | ICD-10-CM

## 2014-09-15 DIAGNOSIS — S72012A Unspecified intracapsular fracture of left femur, initial encounter for closed fracture: Principal | ICD-10-CM | POA: Diagnosis present

## 2014-09-15 DIAGNOSIS — I10 Essential (primary) hypertension: Secondary | ICD-10-CM

## 2014-09-15 DIAGNOSIS — G8929 Other chronic pain: Secondary | ICD-10-CM | POA: Diagnosis present

## 2014-09-15 DIAGNOSIS — Z9889 Other specified postprocedural states: Secondary | ICD-10-CM

## 2014-09-15 DIAGNOSIS — Z9660 Presence of unspecified orthopedic joint implant: Secondary | ICD-10-CM

## 2014-09-15 DIAGNOSIS — Y92000 Kitchen of unspecified non-institutional (private) residence as  the place of occurrence of the external cause: Secondary | ICD-10-CM

## 2014-09-15 DIAGNOSIS — I639 Cerebral infarction, unspecified: Secondary | ICD-10-CM

## 2014-09-15 LAB — CBC WITH DIFFERENTIAL/PLATELET
Basophils Absolute: 0 10*3/uL (ref 0.0–0.1)
Basophils Relative: 0 % (ref 0–1)
Eosinophils Absolute: 0 10*3/uL (ref 0.0–0.7)
Eosinophils Relative: 0 % (ref 0–5)
HCT: 38.8 % (ref 36.0–46.0)
Hemoglobin: 12.9 g/dL (ref 12.0–15.0)
Lymphocytes Relative: 7 % — ABNORMAL LOW (ref 12–46)
Lymphs Abs: 1 10*3/uL (ref 0.7–4.0)
MCH: 29.9 pg (ref 26.0–34.0)
MCHC: 33.2 g/dL (ref 30.0–36.0)
MCV: 89.8 fL (ref 78.0–100.0)
Monocytes Absolute: 0.8 10*3/uL (ref 0.1–1.0)
Monocytes Relative: 6 % (ref 3–12)
Neutro Abs: 12.6 10*3/uL — ABNORMAL HIGH (ref 1.7–7.7)
Neutrophils Relative %: 87 % — ABNORMAL HIGH (ref 43–77)
Platelets: 402 10*3/uL — ABNORMAL HIGH (ref 150–400)
RBC: 4.32 MIL/uL (ref 3.87–5.11)
RDW: 12.9 % (ref 11.5–15.5)
WBC: 14.5 10*3/uL — ABNORMAL HIGH (ref 4.0–10.5)

## 2014-09-15 LAB — BASIC METABOLIC PANEL
Anion gap: 16 — ABNORMAL HIGH (ref 5–15)
BUN: 25 mg/dL — ABNORMAL HIGH (ref 6–23)
CO2: 21 mEq/L (ref 19–32)
Calcium: 10 mg/dL (ref 8.4–10.5)
Chloride: 103 mEq/L (ref 96–112)
Creatinine, Ser: 0.85 mg/dL (ref 0.50–1.10)
GFR calc Af Amer: 79 mL/min — ABNORMAL LOW (ref >90)
GFR calc non Af Amer: 68 mL/min — ABNORMAL LOW (ref >90)
Glucose, Bld: 130 mg/dL — ABNORMAL HIGH (ref 70–99)
Potassium: 4.1 mEq/L (ref 3.7–5.3)
Sodium: 140 mEq/L (ref 137–147)

## 2014-09-15 LAB — PROTIME-INR
INR: 1.05 (ref 0.00–1.49)
Prothrombin Time: 13.8 seconds (ref 11.6–15.2)

## 2014-09-15 LAB — TYPE AND SCREEN
ABO/RH(D): O POS
Antibody Screen: NEGATIVE

## 2014-09-15 LAB — ABO/RH: ABO/RH(D): O POS

## 2014-09-15 MED ORDER — CYCLOBENZAPRINE HCL 10 MG PO TABS
5.0000 mg | ORAL_TABLET | Freq: Three times a day (TID) | ORAL | Status: DC
  Administered 2014-09-15: 5 mg via ORAL
  Filled 2014-09-15: qty 1

## 2014-09-15 MED ORDER — ONDANSETRON HCL 4 MG/2ML IJ SOLN
4.0000 mg | Freq: Once | INTRAMUSCULAR | Status: AC
  Administered 2014-09-15: 4 mg via INTRAVENOUS
  Filled 2014-09-15: qty 2

## 2014-09-15 MED ORDER — FENTANYL CITRATE 0.05 MG/ML IJ SOLN
25.0000 ug | Freq: Once | INTRAMUSCULAR | Status: AC
  Administered 2014-09-16: 25 ug via INTRAVENOUS
  Filled 2014-09-15: qty 2

## 2014-09-15 MED ORDER — FENTANYL CITRATE 0.05 MG/ML IJ SOLN
100.0000 ug | INTRAMUSCULAR | Status: AC | PRN
  Administered 2014-09-15 (×2): 100 ug via INTRAVENOUS
  Filled 2014-09-15 (×2): qty 2

## 2014-09-15 NOTE — ED Notes (Signed)
Pt c/o of left hip pain after fall today in kitchen. 150 Fent given. No LOC no blood thinners.

## 2014-09-15 NOTE — Consult Note (Signed)
ORTHOPAEDIC CONSULTATION  REQUESTING PHYSICIAN: Jasper Riling. Alvino Chapel, MD  Chief Complaint: left displaced femoral neck fracture  HPI: Dana [name] is a 68 y.o. female who complains of  Left hip pain from a mechanical fall  Past Medical History  Diagnosis Date  . Stroke   . Anemia   . Arthritis   . Hypertension   . Hyperlipidemia    Past Surgical History  Procedure Laterality Date  . Abdominal hysterectomy    . Fracture surgery  01/22/12    fx of eye socket from fall   History   Social History  . Marital Status: Widowed    Spouse Name: N/A    Number of Children: N/A  . Years of Education: N/A   Social History Main Topics  . Smoking status: Never Smoker   . Smokeless tobacco: Never Used  . Alcohol Use: None  . Drug Use: None  . Sexual Activity: None   Other Topics Concern  . None   Social History Narrative   Family History  Problem Relation Age of Onset  . Hyperlipidemia Mother   . Hypertension Mother   . Diabetes Mother   . Heart disease Mother     aortic vavle  replaced  . Hypertension Father   . Hyperlipidemia Father   . Heart disease Father     aortic valve replaced  . Alzheimer's disease Father    Allergies  Allergen Reactions  . Percodan [Oxycodone-Aspirin] Nausea Only  . Oxycodone Nausea Only   Prior to Admission medications   Medication Sig Start Date End Date Taking? Authorizing Provider  aspirin 325 MG EC tablet Take 325 mg by mouth daily.   Yes Historical Provider, MD  buPROPion (WELLBUTRIN XL) 300 MG 24 hr tablet Take 300 mg by mouth daily.   Yes Historical Provider, MD  CALCIUM-VITAMIN D PO Take 1 tablet by mouth daily.   Yes Historical Provider, MD  cyclobenzaprine (FLEXERIL) 5 MG tablet Take 5 mg by mouth 3 (three) times daily.   Yes Historical Provider, MD  gabapentin (NEURONTIN) 300 MG capsule Take 300 mg by mouth 3 (three) times daily.   Yes Historical Provider, MD  Multiple Vitamin (MULTIVITAMIN) tablet Take 1 tablet by mouth  daily.   Yes Historical Provider, MD  multivitamin-lutein (OCUVITE-LUTEIN) CAPS Take 1 capsule by mouth daily.   Yes Historical Provider, MD  acetaminophen (TYLENOL) 500 MG tablet Take 500 mg by mouth every 4 (four) hours as needed for moderate pain. 2 daily    Historical Provider, MD  amLODipine (NORVASC) 5 MG tablet Take 5 mg by mouth 2 (two) times daily.    Historical Provider, MD  Calcium Carbonate Antacid (MAALOX PO) Take 30 mLs by mouth daily as needed (diarrhea).     Historical Provider, MD  diclofenac sodium (VOLTAREN) 1 % GEL Apply 1 application topically 4 (four) times daily. To left shoulder    Historical Provider, MD  docusate sodium (COLACE) 100 MG capsule Take 100 mg by mouth.    Historical Provider, MD  lovastatin (MEVACOR) 40 MG tablet Take 40 mg by mouth daily.    Historical Provider, MD  lovastatin (MEVACOR) 40 MG tablet Take 40 mg by mouth daily.    Historical Provider, MD  metoprolol succinate (TOPROL-XL) 25 MG 24 hr tablet Take 25 mg by mouth daily.    Historical Provider, MD  metoprolol tartrate (LOPRESSOR) 25 MG tablet  03/02/12   Historical Provider, MD   Dg Chest 1 View  09/15/2014  CLINICAL DATA:  Pt c/o of left hip pain after fall today in kitchen. Pt can't recall which direction she landed. No chest complaints  EXAM: CHEST - 1 VIEW  COMPARISON:  03/11/2011  FINDINGS: Cardiac silhouette is normal in size and configuration.  Normal mediastinal and hilar contours.  Lungs are clear.  No pleural effusion or pneumothorax.  Bony thorax is demineralized but intact.  IMPRESSION: No active disease.   Electronically Signed   By: Lajean Manes M.D.   On: 09/15/2014 19:58   Dg Hip Complete Left  09/15/2014   CLINICAL DATA:  Pt c/o of left hip pain after fall today in kitchen. Pt can't recall which direction she landedNo chest complaints  EXAM: LEFT HIP - COMPLETE 2+ VIEW  COMPARISON:  None.  FINDINGS: There is a fracture of the subcapital left femoral neck. The distal fracture  component has retracted superiorly by 2 cm. There is varus angulation.  No fracture comminution.  No other fractures. Hip joints are normally aligned. Bones are diffusely demineralized. Soft tissues are unremarkable.  IMPRESSION: Subcapital fracture of the left femoral neck, non comminuted but displaced and with varus angulation.   Electronically Signed   By: Lajean Manes M.D.   On: 09/15/2014 20:01    Positive ROS: All other systems have been reviewed and were otherwise negative with the exception of those mentioned in the HPI and as above.  Labs cbc No results for input(s): WBC, HGB, HCT, PLT in the last 72 hours.  Labs inflam No results for input(s): CRP in the last 72 hours.  Invalid input(s): ESR  Labs coag No results for input(s): INR, PTT in the last 72 hours.  Invalid input(s): PT  No results for input(s): NA, K, CL, CO2, GLUCOSE, BUN, CREATININE, CALCIUM in the last 72 hours.  Physical Exam: Filed Vitals:   09/15/14 1904  BP: 146/73  Pulse: 87  Temp: 98.7 F (37.1 C)  Resp: 18   General: Alert, no acute distress Cardiovascular: No pedal edema Respiratory: No cyanosis, no use of accessory musculature GI: No organomegaly, abdomen is soft and non-tender Skin: No lesions in the area of chief complaint other than those listed below in MSK exam.  Neurologic: Sensation intact distally Psychiatric: Patient is competent for consent with normal mood and affect Lymphatic: No axillary or cervical lymphadenopathy  MUSCULOSKELETAL:  LLE: pain with log rolll, skin benign SILT DP/SP/S/S/T nerve, 2+ DP, +TA/GS/EHL Compartments soft  Other extremities are atraumatic with painless ROM and NVI.  Assessment: Left femoral neck fracture  Plan: OR on 11/8 for left hip hemi pending clearance Weight Bearing Status: bedrest for now, likely WBAT post op PT VTE px: SCD's and hold chemical px in am for surgery   [name], D, MD Cell [phone]   09/15/2014 8:20  PM

## 2014-09-15 NOTE — Progress Notes (Signed)
Her son: Dana [name] Gorka's phone number [phone]

## 2014-09-15 NOTE — H&P (Addendum)
[facility] History and Physical  Dana [name] ZOX:[id number] DOB: Jul 24, 1946 DOA: 09/15/2014  Referring physician: ED physician PCP: Gaye Alken, MD  Specialists:   Chief Complaint: left hip pain  HPI: Dana [name] is a 68 y.o. female with past medical history of stroke, hypertension, hyperlipidemia, who presented with left hip pain after fall.  Patient reports that she had a fall and injured her left hip, when she tried to turn around in kitchen at about 5:00 to 6:00 PM. Then developed left hip pain and muscle spasm. She did not injure her head. She did not have dizziness, palpitation, weakness in her extremities before the event. She did not loss consciousness. She had mild nausea, but no vomiting. She has some chronic right lower back pain that is unchanged. Patient does not have fever, chills, cough, chest pain, shortness of breath,abdominal pain, diarrhea, hematochezia, hematuria, leg edema, rashes. She is not on anticoagulation.   Work up in the ED demonstrates subcapital fracture of the left femoral neck, non comminuted but displaced and with varus angulation by x-ray. patient has leukocytosis with WBC 14.5, hemoglobin stable.at INR 1.05. Patient is admitted to inpatient for further evaluation and treatment. Orthopedic surgeon was consulted, suggested to transfer patient to [facility].  Review of Systems: As presented in the history of presenting illness, rest negative.  Where does patient live? At home  Can patient participate in ADLs? Yes  Allergy:  Allergies  Allergen Reactions  . Percodan [Oxycodone-Aspirin] Nausea Only  . Oxycodone Nausea Only    Past Medical History  Diagnosis Date  . Stroke   . Anemia   . Arthritis   . Hypertension   . Hyperlipidemia     Past Surgical History  Procedure Laterality Date  . Abdominal hysterectomy    . Fracture surgery  01/22/12    fx of eye socket from fall    Social History:  reports that she has  never smoked. She has never used smokeless tobacco. Her alcohol and drug histories are not on file.  Family History:  Family History  Problem Relation Age of Onset  . Hyperlipidemia Mother   . Hypertension Mother   . Diabetes Mother   . Heart disease Mother     aortic vavle  replaced  . Hypertension Father   . Hyperlipidemia Father   . Heart disease Father     aortic valve replaced  . Alzheimer's disease Father      Prior to Admission medications   Medication Sig Start Date End Date Taking? Authorizing Provider  aspirin 325 MG EC tablet Take 325 mg by mouth daily.   Yes Historical Provider, MD  buPROPion (WELLBUTRIN XL) 300 MG 24 hr tablet Take 300 mg by mouth daily.   Yes Historical Provider, MD  CALCIUM-VITAMIN D PO Take 1 tablet by mouth daily.   Yes Historical Provider, MD  cyclobenzaprine (FLEXERIL) 5 MG tablet Take 5 mg by mouth 3 (three) times daily.   Yes Historical Provider, MD  gabapentin (NEURONTIN) 300 MG capsule Take 300 mg by mouth 3 (three) times daily.   Yes Historical Provider, MD  Multiple Vitamin (MULTIVITAMIN) tablet Take 1 tablet by mouth daily.   Yes Historical Provider, MD  multivitamin-lutein (OCUVITE-LUTEIN) CAPS Take 1 capsule by mouth daily.   Yes Historical Provider, MD  acetaminophen (TYLENOL) 500 MG tablet Take 500 mg by mouth every 4 (four) hours as needed for moderate pain. 2 daily    Historical Provider, MD  amLODipine (NORVASC) 5 MG tablet Take  5 mg by mouth 2 (two) times daily.    Historical Provider, MD  Calcium Carbonate Antacid (MAALOX PO) Take 30 mLs by mouth daily as needed (diarrhea).     Historical Provider, MD  diclofenac sodium (VOLTAREN) 1 % GEL Apply 1 application topically 4 (four) times daily. To left shoulder    Historical Provider, MD  docusate sodium (COLACE) 100 MG capsule Take 100 mg by mouth.    Historical Provider, MD  lovastatin (MEVACOR) 40 MG tablet Take 40 mg by mouth daily.    Historical Provider, MD  lovastatin (MEVACOR) 40  MG tablet Take 40 mg by mouth daily.    Historical Provider, MD  metoprolol succinate (TOPROL-XL) 25 MG 24 hr tablet Take 25 mg by mouth daily.    Historical Provider, MD  metoprolol tartrate (LOPRESSOR) 25 MG tablet  03/02/12   Historical Provider, MD    Physical Exam: Filed Vitals:   09/15/14 1904  BP: 146/73  Pulse: 87  Temp: 98.7 F (37.1 C)  TempSrc: Oral  Resp: 18  SpO2: 96%   General: Not in acute distress HEENT:       Eyes: PERRL, EOMI, no scleral icterus       ENT: No discharge from the ears and nose, no pharynx injection, no tonsillar enlargement.        Neck: No JVD, no bruit, no mass felt. Cardiac: S1/S2, RRR, No murmurs, No gallops or rubs Pulm: Good air movement bilaterally. Clear to auscultation bilaterally. No rales, wheezing, rhonchi or rubs. Abd: Soft, nondistended, nontender, no rebound pain, no organomegaly, BS present Ext: No edema bilaterally. 2+DP/PT pulse bilaterally Musculoskeletal: Tenderness over left hip. Decreased range of motion. Neurovascular intact of her left foot. Some shortening and external rotation.  Skin: No rashes.  Neuro: Alert and oriented X3, cranial nerves II-XII grossly intact, muscle strength 5/5 in all extremeties, sensation to light touch intact. Brachial reflex 2+ bilaterally. Knee reflex 1+ bilaterally. Psych: Patient is not psychotic, no suicidal or hemocidal ideation.  Labs on Admission:  Basic Metabolic Panel:  Recent Labs Lab 09/15/14 2001  NA 140  K 4.1  CL 103  CO2 21  GLUCOSE 130*  BUN 25*  CREATININE 0.85  CALCIUM 10.0   Liver Function Tests: No results for input(s): AST, ALT, ALKPHOS, BILITOT, PROT, ALBUMIN in the last 168 hours. No results for input(s): LIPASE, AMYLASE in the last 168 hours. No results for input(s): AMMONIA in the last 168 hours. CBC:  Recent Labs Lab 09/15/14 2001  WBC 14.5*  NEUTROABS 12.6*  HGB 12.9  HCT 38.8  MCV 89.8  PLT 402*   Cardiac Enzymes: No results for input(s):  CKTOTAL, CKMB, CKMBINDEX, TROPONINI in the last 168 hours.  BNP (last 3 results) No results for input(s): PROBNP in the last 8760 hours. CBG: No results for input(s): GLUCAP in the last 168 hours.  Radiological Exams on Admission: Dg Chest 1 View  09/15/2014   CLINICAL DATA:  Pt c/o of left hip pain after fall today in kitchen. Pt can't recall which direction she landed. No chest complaints  EXAM: CHEST - 1 VIEW  COMPARISON:  03/11/2011  FINDINGS: Cardiac silhouette is normal in size and configuration.  Normal mediastinal and hilar contours.  Lungs are clear.  No pleural effusion or pneumothorax.  Bony thorax is demineralized but intact.  IMPRESSION: No active disease.   Electronically Signed   By: Amie Portlandavid  Ormond M.D.   On: 09/15/2014 19:58   Dg Hip Complete Left  09/15/2014  CLINICAL DATA:  Pt c/o of left hip pain after fall today in kitchen. Pt can't recall which direction she landedNo chest complaints  EXAM: LEFT HIP - COMPLETE 2+ VIEW  COMPARISON:  None.  FINDINGS: There is a fracture of the subcapital left femoral neck. The distal fracture component has retracted superiorly by 2 cm. There is varus angulation.  No fracture comminution.  No other fractures. Hip joints are normally aligned. Bones are diffusely demineralized. Soft tissues are unremarkable.  IMPRESSION: Subcapital fracture of the left femoral neck, non comminuted but displaced and with varus angulation.   Electronically Signed   By: Amie Portlandavid  Ormond M.D.   On: 09/15/2014 20:01    EKG: not done yet, will get one.   Assessment/Plan Principal Problem:   Hip fracture, left Active Problems:   History of stroke   Hypertension   Hyperlipidemia   Chronic back pain   Left hip fracture: X-ray showed subcapital fracture of the left femoral neck, non comminuted but displaced and with varus angulation.currently patient is in severe pain. Hemoglobin stable. Hemodynamically stable. Orthopedic surgeon was consulted, suggested to [facility].  - will admit to med-surg - pain control with fentanyl and Tylenol (patient is allergic to oxycodone and percodan) - Flexeril for muscle spasm - follow up ortho recos - SCD per ortho - PTT/INR - NPO - check cbc in AM - PT/OT on 09/17/14  Hx of stroke: approximately 4 years ago, no deficit. Currently on aspirin at home. No new issues. -We continue aspirin  Hypertension: -continue home amlodipine, metoprolol  -hyperlipidemia: -Continue statin    DVT ppx: SCD Code Status: Full code Family Communication: None at bed side.  Disposition Plan: Admit to inpatient   Date of Service 09/15/2014    Dana HarpIU, Dana [name] [facility] Pager [phone]  If 7PM-7AM, please contact night-coverage [email or web address] Password [email or web address] 09/15/2014, 10:09 PM

## 2014-09-15 NOTE — ED Notes (Signed)
Bed: WA01 Expected date: 09/15/14 Expected time: 6:58 PM Means of arrival: Ambulance Comments: Hip injury

## 2014-09-15 NOTE — ED Notes (Signed)
Pt was having severe pain, repostitioned pt and asked admitting MD about muscle relaxant.  i was advised to release the order for flexeril and give to pt which i did.  Pt also given prn pain medication.  Circulation checked in left foot, foot warm and cap refill >3sec.

## 2014-09-15 NOTE — ED Notes (Signed)
[facility] was called for transport, await arrival.  Pt informed of same

## 2014-09-15 NOTE — ED Provider Notes (Signed)
CSN: [id number]     Arrival date & time 09/15/14  1904 History   First MD Initiated Contact with Patient 09/15/14 1907     Chief Complaint  Patient presents with  . Hip Pain     (Consider location/radiation/quality/duration/timing/severity/associated sxs/prior Treatment) Patient is a 68 y.o. female presenting with hip pain. The history is provided by the patient.  Hip Pain This is a new problem. Pertinent negatives include no chest pain, no abdominal pain, no headaches and no shortness of breath.  patient fell and has severe hip pain. No loss of consciousness. No other injury. Has some chronic right lower back pain that is unchanged. She is not on anticoagulation although she has had a previous stroke. She states she has not eaten since lunch.  Past Medical History  Diagnosis Date  . Stroke   . Anemia   . Arthritis   . Hypertension   . Hyperlipidemia    Past Surgical History  Procedure Laterality Date  . Abdominal hysterectomy    . Fracture surgery  01/22/12    fx of eye socket from fall   Family History  Problem Relation Age of Onset  . Hyperlipidemia Mother   . Hypertension Mother   . Diabetes Mother   . Heart disease Mother     aortic vavle  replaced  . Hypertension Father   . Hyperlipidemia Father   . Heart disease Father     aortic valve replaced  . Alzheimer's disease Father    History  Substance Use Topics  . Smoking status: Never Smoker   . Smokeless tobacco: Never Used  . Alcohol Use: Not on file   OB History    No data available     Review of Systems  Constitutional: Negative for activity change and appetite change.  Respiratory: Negative for chest tightness and shortness of breath.   Cardiovascular: Negative for chest pain and leg swelling.  Gastrointestinal: Negative for nausea, vomiting, abdominal pain and diarrhea.  Genitourinary: Negative for flank pain.  Musculoskeletal: Positive for back pain and gait problem. Negative for neck pain and neck  stiffness.       Left hip pain with deformity  Skin: Negative for rash.  Neurological: Negative for weakness, numbness and headaches.  Psychiatric/Behavioral: Negative for behavioral problems.      Allergies  Percodan and Oxycodone  Home Medications   Prior to Admission medications   Medication Sig Start Date End Date Taking? Authorizing Provider  aspirin 325 MG EC tablet Take 325 mg by mouth daily.   Yes Historical Provider, MD  buPROPion (WELLBUTRIN XL) 300 MG 24 hr tablet Take 300 mg by mouth daily.   Yes Historical Provider, MD  CALCIUM-VITAMIN D PO Take 1 tablet by mouth daily.   Yes Historical Provider, MD  cyclobenzaprine (FLEXERIL) 5 MG tablet Take 5 mg by mouth 3 (three) times daily.   Yes Historical Provider, MD  gabapentin (NEURONTIN) 300 MG capsule Take 300 mg by mouth 3 (three) times daily.   Yes Historical Provider, MD  Multiple Vitamin (MULTIVITAMIN) tablet Take 1 tablet by mouth daily.   Yes Historical Provider, MD  multivitamin-lutein (OCUVITE-LUTEIN) CAPS Take 1 capsule by mouth daily.   Yes Historical Provider, MD  acetaminophen (TYLENOL) 500 MG tablet Take 500 mg by mouth every 4 (four) hours as needed for moderate pain. 2 daily    Historical Provider, MD  amLODipine (NORVASC) 5 MG tablet Take 5 mg by mouth 2 (two) times daily.    Historical Provider, MD  Calcium Carbonate Antacid (MAALOX PO) Take 30 mLs by mouth daily as needed (diarrhea).     Historical Provider, MD  diclofenac sodium (VOLTAREN) 1 % GEL Apply 1 application topically 4 (four) times daily. To left shoulder    Historical Provider, MD  docusate sodium (COLACE) 100 MG capsule Take 100 mg by mouth.    Historical Provider, MD  lovastatin (MEVACOR) 40 MG tablet Take 40 mg by mouth daily.    Historical Provider, MD  lovastatin (MEVACOR) 40 MG tablet Take 40 mg by mouth daily.    Historical Provider, MD  metoprolol succinate (TOPROL-XL) 25 MG 24 hr tablet Take 25 mg by mouth daily.    Historical Provider, MD   metoprolol tartrate (LOPRESSOR) 25 MG tablet  03/02/12   Historical Provider, MD   BP 145/71 mmHg  Pulse 97  Temp(Src) 98.1 F (36.7 C) (Oral)  Resp 20  SpO2 94% Physical Exam  Constitutional: She is oriented to person, place, and time. She appears well-developed and well-nourished.  HENT:  Head: Normocephalic and atraumatic.  Neck: Normal range of motion. Neck supple.  Cardiovascular: Normal rate, regular rhythm and normal heart sounds.   No murmur heard. Pulmonary/Chest: Effort normal and breath sounds normal. No respiratory distress. She has no wheezes. She has no rales.  Abdominal: Soft. Bowel sounds are normal. She exhibits no distension. There is no tenderness. There is no rebound and no guarding.  Musculoskeletal: She exhibits tenderness.  Tenderness over left hip. Decreased range of motion. Neurovascular intact of her left foot. Some shortening and external rotation.  Neurological: She is alert and oriented to person, place, and time. No cranial nerve deficit.  Skin: Skin is warm and dry.  Psychiatric: She has a normal mood and affect. Her speech is normal.  Nursing note and vitals reviewed.   ED Course  Procedures (including critical care time) Labs Review Labs Reviewed  BASIC METABOLIC PANEL - Abnormal; Notable for the following:    Glucose, Bld 130 (*)    BUN 25 (*)    GFR calc non Af Amer 68 (*)    GFR calc Af Amer 79 (*)    Anion gap 16 (*)    All other components within normal limits  CBC WITH DIFFERENTIAL - Abnormal; Notable for the following:    WBC 14.5 (*)    Platelets 402 (*)    Neutrophils Relative % 87 (*)    Neutro Abs 12.6 (*)    Lymphocytes Relative 7 (*)    All other components within normal limits  PROTIME-INR  TYPE AND SCREEN  ABO/RH    Imaging Review Dg Chest 1 View  09/15/2014   CLINICAL DATA:  Pt c/o of left hip pain after fall today in kitchen. Pt can't recall which direction she landed. No chest complaints  EXAM: CHEST - 1 VIEW   COMPARISON:  03/11/2011  FINDINGS: Cardiac silhouette is normal in size and configuration.  Normal mediastinal and hilar contours.  Lungs are clear.  No pleural effusion or pneumothorax.  Bony thorax is demineralized but intact.  IMPRESSION: No active disease.   Electronically Signed   By: Amie Portlandavid  Ormond M.D.   On: 09/15/2014 19:58   Dg Hip Complete Left  09/15/2014   CLINICAL DATA:  Pt c/o of left hip pain after fall today in kitchen. Pt can't recall which direction she landedNo chest complaints  EXAM: LEFT HIP - COMPLETE 2+ VIEW  COMPARISON:  None.  FINDINGS: There is a fracture of the subcapital left  femoral neck. The distal fracture component has retracted superiorly by 2 cm. There is varus angulation.  No fracture comminution.  No other fractures. Hip joints are normally aligned. Bones are diffusely demineralized. Soft tissues are unremarkable.  IMPRESSION: Subcapital fracture of the left femoral neck, non comminuted but displaced and with varus angulation.   Electronically Signed   By: Amie Portland M.D.   On: 09/15/2014 20:01     EKG Interpretation None      MDM   Final diagnoses:  Hip fracture, left, closed, initial encounter    Patient with fall. Left subcapital hip fracture. Discussed with Dr. Eulah Pont and requests transfer to [facility] for surgery tomorrow. Discussed with Dr. Fayrene Fearing, who will admit the patient    Juliet Rude. Rubin Payor, MD 09/15/14 [phone]

## 2014-09-16 ENCOUNTER — Inpatient Hospital Stay (HOSPITAL_COMMUNITY): Payer: Medicare Other | Admitting: Anesthesiology

## 2014-09-16 ENCOUNTER — Inpatient Hospital Stay (HOSPITAL_COMMUNITY): Payer: Medicare Other

## 2014-09-16 ENCOUNTER — Encounter (HOSPITAL_COMMUNITY)
Admission: EM | Disposition: A | Discharge status: Skilled Nursing Facility | Payer: Self-pay | Source: Home / Self Care | Attending: Internal Medicine

## 2014-09-16 HISTORY — PX: HIP ARTHROPLASTY: SHX981

## 2014-09-16 LAB — CBC
HCT: 37.1 % (ref 36.0–46.0)
Hemoglobin: 12.1 g/dL (ref 12.0–15.0)
MCH: 29.5 pg (ref 26.0–34.0)
MCHC: 32.6 g/dL (ref 30.0–36.0)
MCV: 90.5 fL (ref 78.0–100.0)
Platelets: 351 10*3/uL (ref 150–400)
RBC: 4.1 MIL/uL (ref 3.87–5.11)
RDW: 13.3 % (ref 11.5–15.5)
WBC: 8.9 10*3/uL (ref 4.0–10.5)

## 2014-09-16 LAB — BASIC METABOLIC PANEL
Anion gap: 13 (ref 5–15)
BUN: 16 mg/dL (ref 6–23)
CO2: 23 mEq/L (ref 19–32)
Calcium: 9 mg/dL (ref 8.4–10.5)
Chloride: 102 mEq/L (ref 96–112)
Creatinine, Ser: 0.69 mg/dL (ref 0.50–1.10)
GFR calc Af Amer: >90 mL/min (ref >90)
GFR calc non Af Amer: 86 mL/min — ABNORMAL LOW (ref >90)
Glucose, Bld: 130 mg/dL — ABNORMAL HIGH (ref 70–99)
Potassium: 4.2 mEq/L (ref 3.7–5.3)
Sodium: 138 mEq/L (ref 137–147)

## 2014-09-16 LAB — APTT: aPTT: 33 seconds (ref 24–37)

## 2014-09-16 LAB — MRSA PCR SCREENING: MRSA by PCR: NEGATIVE

## 2014-09-16 SURGERY — HEMIARTHROPLASTY, HIP, DIRECT ANTERIOR APPROACH, FOR FRACTURE
Anesthesia: General | Site: Hip | Laterality: Left

## 2014-09-16 MED ORDER — ONDANSETRON HCL 4 MG/2ML IJ SOLN: 4.0000 mg | Freq: Three times a day (TID) | INTRAMUSCULAR | Status: DC | PRN

## 2014-09-16 MED ORDER — PROPOFOL 10 MG/ML IV BOLUS
INTRAVENOUS | Status: AC
  Filled 2014-09-16: qty 20

## 2014-09-16 MED ORDER — HYDROMORPHONE HCL 1 MG/ML IJ SOLN
1.0000 mg | INTRAMUSCULAR | Status: DC | PRN
  Administered 2014-09-16 (×4): 1 mg via INTRAVENOUS
  Administered 2014-09-17: 2 mg via INTRAVENOUS
  Filled 2014-09-16: qty 1
  Filled 2014-09-16 (×2): qty 2
  Filled 2014-09-16: qty 1

## 2014-09-16 MED ORDER — ONDANSETRON HCL 4 MG/2ML IJ SOLN
INTRAMUSCULAR | Status: AC
  Filled 2014-09-16: qty 2

## 2014-09-16 MED ORDER — FENTANYL CITRATE 0.05 MG/ML IJ SOLN: 25.0000 ug | INTRAMUSCULAR | Status: DC | PRN

## 2014-09-16 MED ORDER — PHENYLEPHRINE 40 MCG/ML (10ML) SYRINGE FOR IV PUSH (FOR BLOOD PRESSURE SUPPORT)
PREFILLED_SYRINGE | INTRAVENOUS | Status: AC
  Filled 2014-09-16: qty 10

## 2014-09-16 MED ORDER — BUPROPION HCL ER (XL) 300 MG PO TB24
300.0000 mg | ORAL_TABLET | Freq: Every day | ORAL | Status: DC
  Administered 2014-09-17 – 2014-09-18 (×2): 300 mg via ORAL
  Filled 2014-09-16 (×3): qty 1

## 2014-09-16 MED ORDER — OCUVITE-LUTEIN PO CAPS
1.0000 | ORAL_CAPSULE | Freq: Every day | ORAL | Status: DC
  Administered 2014-09-17 – 2014-09-18 (×2): 1 via ORAL
  Filled 2014-09-16 (×4): qty 1

## 2014-09-16 MED ORDER — ADULT MULTIVITAMIN W/MINERALS CH
1.0000 | ORAL_TABLET | Freq: Every day | ORAL | Status: DC
  Administered 2014-09-17 – 2014-09-18 (×2): 1 via ORAL
  Filled 2014-09-16 (×3): qty 1

## 2014-09-16 MED ORDER — ASPIRIN EC 325 MG PO TBEC
325.0000 mg | DELAYED_RELEASE_TABLET | Freq: Every day | ORAL | Status: DC
  Filled 2014-09-16: qty 1

## 2014-09-16 MED ORDER — 0.9 % SODIUM CHLORIDE (POUR BTL) OPTIME
TOPICAL | Status: DC | PRN
  Administered 2014-09-16: 1000 mL

## 2014-09-16 MED ORDER — FENTANYL CITRATE 0.05 MG/ML IJ SOLN
INTRAMUSCULAR | Status: AC
Start: 2014-09-16 — End: 2014-09-16
  Filled 2014-09-16: qty 5

## 2014-09-16 MED ORDER — DEXAMETHASONE SODIUM PHOSPHATE 4 MG/ML IJ SOLN
INTRAMUSCULAR | Status: DC | PRN
  Administered 2014-09-16: 4 mg via INTRAVENOUS

## 2014-09-16 MED ORDER — AMLODIPINE BESYLATE 5 MG PO TABS
5.0000 mg | ORAL_TABLET | Freq: Two times a day (BID) | ORAL | Status: DC
  Administered 2014-09-16 – 2014-09-18 (×6): 5 mg via ORAL
  Filled 2014-09-16 (×8): qty 1

## 2014-09-16 MED ORDER — DEXAMETHASONE SODIUM PHOSPHATE 4 MG/ML IJ SOLN
INTRAMUSCULAR | Status: AC
  Filled 2014-09-16: qty 1

## 2014-09-16 MED ORDER — HYDROCODONE-ACETAMINOPHEN 5-325 MG PO TABS: 1.0000 | ORAL_TABLET | ORAL | Status: DC | PRN

## 2014-09-16 MED ORDER — DEXTROSE-NACL 5-0.45 % IV SOLN
INTRAVENOUS | Status: AC
  Administered 2014-09-16: 19:00:00 via INTRAVENOUS

## 2014-09-16 MED ORDER — ROCURONIUM BROMIDE 100 MG/10ML IV SOLN
INTRAVENOUS | Status: DC | PRN
  Administered 2014-09-16: 40 mg via INTRAVENOUS

## 2014-09-16 MED ORDER — CALCIUM CARBONATE ANTACID 500 MG PO CHEW
1.0000 | CHEWABLE_TABLET | Freq: Every day | ORAL | Status: DC | PRN
  Filled 2014-09-16: qty 1

## 2014-09-16 MED ORDER — LACTATED RINGERS IV SOLN
INTRAVENOUS | Status: DC | PRN
  Administered 2014-09-16 (×2): via INTRAVENOUS

## 2014-09-16 MED ORDER — MENTHOL 3 MG MT LOZG: 1.0000 | LOZENGE | OROMUCOSAL | Status: DC | PRN

## 2014-09-16 MED ORDER — GABAPENTIN 300 MG PO CAPS
300.0000 mg | ORAL_CAPSULE | Freq: Three times a day (TID) | ORAL | Status: DC
  Administered 2014-09-16 – 2014-09-18 (×7): 300 mg via ORAL
  Filled 2014-09-16 (×10): qty 1

## 2014-09-16 MED ORDER — CEFAZOLIN SODIUM-DEXTROSE 2-3 GM-% IV SOLR
INTRAVENOUS | Status: AC
Start: 2014-09-16 — End: 2014-09-16
  Filled 2014-09-16: qty 50

## 2014-09-16 MED ORDER — LIDOCAINE HCL (CARDIAC) 20 MG/ML IV SOLN
INTRAVENOUS | Status: DC | PRN
  Administered 2014-09-16: 60 mg via INTRAVENOUS

## 2014-09-16 MED ORDER — ONDANSETRON HCL 4 MG/2ML IJ SOLN
INTRAMUSCULAR | Status: DC | PRN
  Administered 2014-09-16: 4 mg via INTRAVENOUS

## 2014-09-16 MED ORDER — PRAVASTATIN SODIUM 40 MG PO TABS
40.0000 mg | ORAL_TABLET | Freq: Every day | ORAL | Status: DC
  Administered 2014-09-16 – 2014-09-17 (×2): 40 mg via ORAL
  Filled 2014-09-16 (×4): qty 1

## 2014-09-16 MED ORDER — CEFAZOLIN SODIUM-DEXTROSE 2-3 GM-% IV SOLR
2.0000 g | Freq: Four times a day (QID) | INTRAVENOUS | Status: AC
  Administered 2014-09-16 (×2): 2 g via INTRAVENOUS
  Filled 2014-09-16 (×2): qty 50

## 2014-09-16 MED ORDER — PHENOL 1.4 % MT LIQD: 1.0000 | OROMUCOSAL | Status: DC | PRN

## 2014-09-16 MED ORDER — DOCUSATE SODIUM 100 MG PO CAPS: 100.0000 mg | ORAL_CAPSULE | Freq: Every day | ORAL | Status: DC | PRN

## 2014-09-16 MED ORDER — ACETAMINOPHEN 500 MG PO TABS: 500.0000 mg | ORAL_TABLET | ORAL | Status: DC | PRN

## 2014-09-16 MED ORDER — FENTANYL CITRATE 0.05 MG/ML IJ SOLN
INTRAMUSCULAR | Status: DC | PRN
  Administered 2014-09-16: 150 ug via INTRAVENOUS
  Administered 2014-09-16 (×7): 50 ug via INTRAVENOUS

## 2014-09-16 MED ORDER — METOPROLOL SUCCINATE ER 25 MG PO TB24
25.0000 mg | ORAL_TABLET | Freq: Every day | ORAL | Status: DC
  Administered 2014-09-16 – 2014-09-18 (×3): 25 mg via ORAL
  Filled 2014-09-16 (×4): qty 1

## 2014-09-16 MED ORDER — FENTANYL CITRATE 0.05 MG/ML IJ SOLN
INTRAMUSCULAR | Status: AC
  Filled 2014-09-16: qty 5

## 2014-09-16 MED ORDER — CYCLOBENZAPRINE HCL 10 MG PO TABS
10.0000 mg | ORAL_TABLET | Freq: Three times a day (TID) | ORAL | Status: DC
  Administered 2014-09-16 – 2014-09-18 (×6): 10 mg via ORAL
  Filled 2014-09-16 (×9): qty 1

## 2014-09-16 MED ORDER — METOCLOPRAMIDE HCL 10 MG PO TABS: 5.0000 mg | ORAL_TABLET | Freq: Three times a day (TID) | ORAL | Status: DC | PRN

## 2014-09-16 MED ORDER — PROPOFOL 10 MG/ML IV BOLUS
INTRAVENOUS | Status: DC | PRN
  Administered 2014-09-16 (×2): 10 mg via INTRAVENOUS
  Administered 2014-09-16: 160 mg via INTRAVENOUS
  Administered 2014-09-16 (×2): 10 mg via INTRAVENOUS

## 2014-09-16 MED ORDER — METOCLOPRAMIDE HCL 5 MG/ML IJ SOLN: 5.0000 mg | Freq: Three times a day (TID) | INTRAMUSCULAR | Status: DC | PRN

## 2014-09-16 MED ORDER — GLYCOPYRROLATE 0.2 MG/ML IJ SOLN
INTRAMUSCULAR | Status: AC
  Filled 2014-09-16: qty 4

## 2014-09-16 MED ORDER — ASPIRIN EC 325 MG PO TBEC: 325.0000 mg | DELAYED_RELEASE_TABLET | Freq: Every day | ORAL | Status: DC

## 2014-09-16 MED ORDER — ASPIRIN EC 325 MG PO TBEC
325.0000 mg | DELAYED_RELEASE_TABLET | Freq: Every day | ORAL | Status: DC
  Administered 2014-09-17 – 2014-09-18 (×2): 325 mg via ORAL
  Filled 2014-09-16 (×3): qty 1

## 2014-09-16 MED ORDER — ROCURONIUM BROMIDE 50 MG/5ML IV SOLN
INTRAVENOUS | Status: AC
  Filled 2014-09-16: qty 1

## 2014-09-16 MED ORDER — HYDROCODONE-ACETAMINOPHEN 5-325 MG PO TABS
1.0000 | ORAL_TABLET | Freq: Four times a day (QID) | ORAL | Status: DC | PRN
  Administered 2014-09-17: 1 via ORAL
  Administered 2014-09-18 (×2): 2 via ORAL
  Filled 2014-09-16: qty 2
  Filled 2014-09-16: qty 1
  Filled 2014-09-16: qty 2

## 2014-09-16 MED ORDER — CEFAZOLIN SODIUM-DEXTROSE 2-3 GM-% IV SOLR
INTRAVENOUS | Status: DC | PRN
  Administered 2014-09-16: 2 g via INTRAVENOUS

## 2014-09-16 MED ORDER — CALCIUM CARBONATE-VITAMIN D 500-200 MG-UNIT PO TABS
1.0000 | ORAL_TABLET | Freq: Every day | ORAL | Status: DC
  Administered 2014-09-17 – 2014-09-18 (×2): 1 via ORAL
  Filled 2014-09-16 (×3): qty 1

## 2014-09-16 MED ORDER — SODIUM CHLORIDE 0.9 % IV SOLN
INTRAVENOUS | Status: DC
  Administered 2014-09-16 – 2014-09-17 (×2): via INTRAVENOUS

## 2014-09-16 MED ORDER — MORPHINE SULFATE 2 MG/ML IJ SOLN: 0.5000 mg | INTRAMUSCULAR | Status: DC | PRN

## 2014-09-16 SURGICAL SUPPLY — 45 items
BIT DRILL 7/64X5 DISP (BIT) ×2 IMPLANT
BLADE SAGITTAL 25.0X1.27X90 (BLADE) ×2 IMPLANT
CLSR STERI-STRIP ANTIMIC 1/2X4 (GAUZE/BANDAGES/DRESSINGS) ×4 IMPLANT
COVER SURGICAL LIGHT HANDLE (MISCELLANEOUS) ×2 IMPLANT
DERMABOND ADVANCED (GAUZE/BANDAGES/DRESSINGS) ×2
DERMABOND ADVANCED .7 DNX12 (GAUZE/BANDAGES/DRESSINGS) ×2 IMPLANT
DRAPE IMP U-DRAPE 54X76 (DRAPES) ×2 IMPLANT
DRAPE ORTHO SPLIT 77X108 STRL (DRAPES) ×2
DRAPE SURG ORHT 6 SPLT 77X108 (DRAPES) ×2 IMPLANT
DRAPE U-SHAPE 47X51 STRL (DRAPES) ×2 IMPLANT
DRSG AQUACEL AG ADV 3.5X10 (GAUZE/BANDAGES/DRESSINGS) ×2 IMPLANT
DRSG MEPILEX BORDER 4X8 (GAUZE/BANDAGES/DRESSINGS) ×2 IMPLANT
DURAPREP 26ML APPLICATOR (WOUND CARE) ×2 IMPLANT
ELECT CAUTERY BLADE 6.4 (BLADE) ×2 IMPLANT
ELECT REM PT RETURN 9FT ADLT (ELECTROSURGICAL) ×2
ELECTRODE REM PT RTRN 9FT ADLT (ELECTROSURGICAL) ×1 IMPLANT
FACESHIELD WRAPAROUND (MASK) ×2 IMPLANT
GLOVE BIO SURGEON STRL SZ7.5 (GLOVE) ×2 IMPLANT
GLOVE BIOGEL PI IND STRL 8 (GLOVE) ×1 IMPLANT
GLOVE BIOGEL PI INDICATOR 8 (GLOVE) ×1
GOWN STRL REUS W/ TWL LRG LVL3 (GOWN DISPOSABLE) ×1 IMPLANT
GOWN STRL REUS W/ TWL XL LVL3 (GOWN DISPOSABLE) ×1 IMPLANT
GOWN STRL REUS W/TWL LRG LVL3 (GOWN DISPOSABLE) ×1
GOWN STRL REUS W/TWL XL LVL3 (GOWN DISPOSABLE) ×1
HIP HEMIARTHROPLASTY LEV 1A ×2 IMPLANT
KIT BASIN OR (CUSTOM PROCEDURE TRAY) ×2 IMPLANT
KIT ROOM TURNOVER OR (KITS) ×2 IMPLANT
MANIFOLD NEPTUNE II (INSTRUMENTS) IMPLANT
NS IRRIG 1000ML POUR BTL (IV SOLUTION) ×2 IMPLANT
PACK TOTAL JOINT (CUSTOM PROCEDURE TRAY) ×2 IMPLANT
PACK UNIVERSAL I (CUSTOM PROCEDURE TRAY) ×2 IMPLANT
PAD ARMBOARD 7.5X6 YLW CONV (MISCELLANEOUS) ×4 IMPLANT
PILLOW ABDUCTION HIP (SOFTGOODS) ×2 IMPLANT
RETRIEVER SUT HEWSON (MISCELLANEOUS) ×2 IMPLANT
SUT FIBERWIRE #2 38 REV NDL BL (SUTURE) ×4
SUT MNCRL AB 4-0 PS2 18 (SUTURE) ×2 IMPLANT
SUT MON AB 2-0 CT1 36 (SUTURE) ×4 IMPLANT
SUT VIC AB 0 CT1 27 (SUTURE) ×1
SUT VIC AB 0 CT1 27XBRD ANBCTR (SUTURE) ×1 IMPLANT
SUTURE FIBERWR#2 38 REV NDL BL (SUTURE) ×2 IMPLANT
TOWEL OR 17X24 6PK STRL BLUE (TOWEL DISPOSABLE) ×2 IMPLANT
TOWEL OR 17X26 10 PK STRL BLUE (TOWEL DISPOSABLE) ×2 IMPLANT
TOWEL OR NON WOVEN STRL DISP B (DISPOSABLE) ×2 IMPLANT
TRAY FOLEY CATH 14FR (SET/KITS/TRAYS/PACK) IMPLANT
WATER STERILE IRR 1000ML POUR (IV SOLUTION) ×4 IMPLANT

## 2014-09-16 NOTE — Anesthesia Preprocedure Evaluation (Addendum)
Anesthesia Evaluation  Patient identified by MRN, date of birth, ID band Patient awake    Reviewed: Allergy & Precautions, H&P , NPO status , Patient's Chart, lab work & pertinent test results, reviewed documented beta blocker date and time   Airway Mallampati: III  TM Distance: >3 FB Neck ROM: Full    Dental  (+) Teeth Intact, Dental Advisory Given   Pulmonary neg pulmonary ROS,  breath sounds clear to auscultation        Cardiovascular hypertension, Pt. on medications and Pt. on home beta blockers Rhythm:Regular Rate:Normal     Neuro/Psych CVA, No Residual Symptoms    GI/Hepatic negative GI ROS, Neg liver ROS,   Endo/Other  negative endocrine ROS  Renal/GU negative Renal ROS     Musculoskeletal   Abdominal   Peds  Hematology   Anesthesia Other Findings   Reproductive/Obstetrics                          Anesthesia Physical Anesthesia Plan  ASA: III  Anesthesia Plan: General   Post-op Pain Management:    Induction: Intravenous  Airway Management Planned: Oral ETT  Additional Equipment:   Intra-op Plan:   Post-operative Plan: Extubation in OR  Informed Consent: I have reviewed the patients History and Physical, chart, labs and discussed the procedure including the risks, benefits and alternatives for the proposed anesthesia with the patient or authorized representative who has indicated his/her understanding and acceptance.   Dental advisory given  Plan Discussed with: CRNA, Anesthesiologist and Surgeon  Anesthesia Plan Comments:       Anesthesia Quick Evaluation

## 2014-09-16 NOTE — Progress Notes (Signed)
Patient Demographics  Dana [name], is a 68 y.o. female, DOB - 01/18/1946, ZOX:[id number]  Admit date - 09/15/2014   Admitting Physician Lorretta HarpXilin Niu, MD  Outpatient Primary MD for the patient is Dana [name],Dana STEWART, MD  LOS - 1   Chief Complaint  Patient presents with  . Hip Pain        Subjective:   Dana Cossarol Coe today has, No headache, No chest pain, No abdominal pain - No Nausea, No new weakness tingling or numbness, No Cough - SOB.    Assessment & Plan    1. Mech.Fall with Hip fracture, left - post ORIF L Hip T murphy 09-16-13, weight bearing as tolerated, ASA for DVT prophylaxis per Ortho, PT- ? Placement, monitor H&H.   2.HTN - B.Blocker and Norvasc   3. H/O CVA - ASA, statin for secondary prevention   4. Hyperlipidemia - on statin   5. Chronic back pain - supportive care.     Code Status: Full  Family Communication: None present  Disposition Plan: TBD   Procedures ORIF L Hip T murphy 09-16-13   Consults  Ortho   Medications  Scheduled Meds: . amLODipine  5 mg Oral BID  . [START ON 09/17/2014] aspirin EC  325 mg Oral Q breakfast  . buPROPion  300 mg Oral Daily  . calcium-vitamin D  1 tablet Oral Daily  .  ceFAZolin (ANCEF) IV  2 g Intravenous Q6H  . cyclobenzaprine  10 mg Oral TID  . gabapentin  300 mg Oral TID  . metoprolol succinate  25 mg Oral Daily  . multivitamin with minerals  1 tablet Oral Daily  . multivitamin-lutein  1 capsule Oral Daily  . pravastatin  40 mg Oral q1800   Continuous Infusions: . sodium chloride 100 mL/hr at 09/16/14 0405  . dextrose 5 % and 0.45% NaCl     PRN Meds:.acetaminophen, calcium carbonate, docusate sodium, fentaNYL, fentaNYL, HYDROcodone-acetaminophen, HYDROmorphone (DILAUDID) injection, menthol-cetylpyridinium **OR**  phenol, metoCLOPramide **OR** metoCLOPramide (REGLAN) injection, morphine injection, ondansetron (ZOFRAN) IV  DVT Prophylaxis  ASA - SCDs    Lab Results  Component Value Date   PLT 351 09/16/2014    Antibiotics     Anti-infectives    Start     Dose/Rate Route Frequency Ordered Stop   09/16/14 1430  ceFAZolin (ANCEF) IVPB 2 g/50 mL premix     2 g100 mL/hr over 30 Minutes Intravenous Every 6 hours 09/16/14 1418 09/17/14 0229          Objective:   Filed Vitals:   09/16/14 1305 09/16/14 1320 09/16/14 1335 09/16/14 1405  BP: 143/57 134/57 155/79 139/66  Pulse: 88 90 96 86  Temp:  98.1 F (36.7 C)    TempSrc:      Resp: 17 17 20 17   SpO2: 95% 94% 93% 92%    Wt Readings from Last 3 Encounters:  07/15/12 69.673 kg (153 lb 9.6 oz)  03/15/12 71.94 kg (158 lb 9.6 oz)  01/12/12 68.04 kg (150 lb)     Intake/Output Summary (Last 24 hours) at 09/16/14 1425 Last data filed at 09/16/14 1300  Gross per 24 hour  Intake   1500 ml  Output   2100 ml  Net   -600 ml  Physical Exam  Awake Alert, Oriented X 3, No new F.N deficits, Normal affect [place].AT,PERRAL Supple Neck,No JVD, No cervical lymphadenopathy appriciated.  Symmetrical Chest wall movement, Good air movement bilaterally, CTAB RRR,No Gallops,Rubs or new Murmurs, No Parasternal Heave +ve B.Sounds, Abd Soft, No tenderness, No organomegaly appriciated, No rebound - guarding or rigidity. No Cyanosis, Clubbing or edema, No new Rash or bruise    Data Review   Micro Results Recent Results (from the past 240 hour(s))  MRSA PCR Screening     Status: None   Collection Time: 09/16/14  3:05 AM  Result Value Ref Range Status   MRSA by PCR NEGATIVE NEGATIVE Final    Comment:        The GeneXpert MRSA Assay (FDA approved for NASAL specimens only), is one component of a comprehensive MRSA colonization surveillance program. It is not intended to diagnose MRSA infection nor to guide or monitor treatment for MRSA  infections.     Radiology Reports Dg Chest 1 View  09/15/2014   CLINICAL DATA:  Pt c/o of left hip pain after fall today in kitchen. Pt can't recall which direction she landed. No chest complaints  EXAM: CHEST - 1 VIEW  COMPARISON:  03/11/2011  FINDINGS: Cardiac silhouette is normal in size and configuration.  Normal mediastinal and hilar contours.  Lungs are clear.  No pleural effusion or pneumothorax.  Bony thorax is demineralized but intact.  IMPRESSION: No active disease.   Electronically Signed   By: Amie Portlandavid  Ormond M.D.   On: 09/15/2014 19:58   Dg Hip Complete Left  09/15/2014   CLINICAL DATA:  Pt c/o of left hip pain after fall today in kitchen. Pt can't recall which direction she landedNo chest complaints  EXAM: LEFT HIP - COMPLETE 2+ VIEW  COMPARISON:  None.  FINDINGS: There is a fracture of the subcapital left femoral neck. The distal fracture component has retracted superiorly by 2 cm. There is varus angulation.  No fracture comminution.  No other fractures. Hip joints are normally aligned. Bones are diffusely demineralized. Soft tissues are unremarkable.  IMPRESSION: Subcapital fracture of the left femoral neck, non comminuted but displaced and with varus angulation.   Electronically Signed   By: Amie Portlandavid  Ormond M.D.   On: 09/15/2014 20:01   Pelvis Portable  09/16/2014   CLINICAL DATA:  Post arthroplasty  EXAM: PORTABLE PELVIS 1-2 VIEWS  COMPARISON:  09/15/2014  FINDINGS: A single postoperative exam after left total hip arthroplasty demonstrates expected appearance. No evidence for hardware failure. Overlying soft tissue gas. No fracture line visualized.  IMPRESSION: Expected postoperative appearance after left total hip arthroplasty.   Electronically Signed   By: Christiana PellantGretchen  Green M.D.   On: 09/16/2014 12:58     CBC  Recent Labs Lab 09/15/14 2001 09/16/14 0500  WBC 14.5* 8.9  HGB 12.9 12.1  HCT 38.8 37.1  PLT 402* 351  MCV 89.8 90.5  MCH 29.9 29.5  MCHC 33.2 32.6  RDW 12.9 13.3    LYMPHSABS 1.0  --   MONOABS 0.8  --   EOSABS 0.0  --   BASOSABS 0.0  --     Chemistries   Recent Labs Lab 09/15/14 2001 09/16/14 0500  NA 140 138  K 4.1 4.2  CL 103 102  CO2 21 23  GLUCOSE 130* 130*  BUN 25* 16  CREATININE 0.85 0.69  CALCIUM 10.0 9.0   ------------------------------------------------------------------------------------------------------------------ CrCl cannot be calculated (Unknown ideal weight.). ------------------------------------------------------------------------------------------------------------------ No results for input(s): HGBA1C in the last  72 hours. ------------------------------------------------------------------------------------------------------------------ No results for input(s): CHOL, HDL, LDLCALC, TRIG, CHOLHDL, LDLDIRECT in the last 72 hours. ------------------------------------------------------------------------------------------------------------------ No results for input(s): TSH, T4TOTAL, T3FREE, THYROIDAB in the last 72 hours.  Invalid input(s): FREET3 ------------------------------------------------------------------------------------------------------------------ No results for input(s): VITAMINB12, FOLATE, FERRITIN, TIBC, IRON, RETICCTPCT in the last 72 hours.  Coagulation profile  Recent Labs Lab 09/15/14 2001  INR 1.05    No results for input(s): DDIMER in the last 72 hours.  Cardiac Enzymes No results for input(s): CKMB, TROPONINI, MYOGLOBIN in the last 168 hours.  Invalid input(s): CK ------------------------------------------------------------------------------------------------------------------ Invalid input(s): POCBNP     Time Spent in minutes   35   Jahn Franchini K M.D on 09/16/2014 at 2:25 PM  Between 7am to 7pm - Pager - [phone]  After 7pm go to [email or web address] - password [email or web address]  And look for the night coverage person covering for me after hours  [facility] Office   [phone]

## 2014-09-16 NOTE — Anesthesia Postprocedure Evaluation (Signed)
  Anesthesia Post-op Note  Patient: Dana CossCarol [name]  Procedure(s) Performed: Procedure(s): ARTHROPLASTY BIPOLAR HIP (Left)  Patient Location: PACU  Anesthesia Type:General  Level of Consciousness: awake  Airway and Oxygen Therapy: Patient Spontanous Breathing  Post-op Pain: mild  Post-op Assessment: Post-op Vital signs reviewed  Post-op Vital Signs: Reviewed  Last Vitals:  Filed Vitals:   09/16/14 1235  BP: 139/56  Pulse: 89  Temp:   Resp: 16    Complications: No apparent anesthesia complications

## 2014-09-16 NOTE — Anesthesia Procedure Notes (Signed)
Procedure Name: Intubation Date/Time: 09/16/2014 10:11 AM Performed by: Alanda AmassFRIEDMAN, Shelvy Perazzo A Pre-anesthesia Checklist: Patient identified, Timeout performed, Emergency Drugs available, Suction available and Patient being monitored Patient Re-evaluated:Patient Re-evaluated prior to inductionOxygen Delivery Method: Circle system utilized Preoxygenation: Pre-oxygenation with 100% oxygen Intubation Type: IV induction Ventilation: Mask ventilation without difficulty Tube type: Oral Tube size: 7.5 mm Number of attempts: 1 Airway Equipment and Method: Video-laryngoscopy Placement Confirmation: ETT inserted through vocal cords under direct vision,  breath sounds checked- equal and bilateral and positive ETCO2 Secured at: 21 cm Tube secured with: Tape Dental Injury: Teeth and Oropharynx as per pre-operative assessment

## 2014-09-16 NOTE — Transfer of Care (Signed)
Immediate Anesthesia Transfer of Care Note  Patient: Dana CossCarol Cart  Procedure(s) Performed: Procedure(s): ARTHROPLASTY BIPOLAR HIP (Left)  Patient Location: PACU  Anesthesia Type:General  Level of Consciousness: awake  Airway & Oxygen Therapy: Patient Spontanous Breathing and Patient connected to nasal cannula oxygen  Post-op Assessment: Report given to PACU RN and Post -op Vital signs reviewed and stable  Post vital signs: Reviewed and stable  Complications: No apparent anesthesia complications

## 2014-09-16 NOTE — Progress Notes (Signed)
Orthopedic Tech Progress Note Patient Details:  Dana CossCarol [name] 08/10/1946 [id number] OHF applied to bed Patient ID: Dana Cossarol Rumbold, female   DOB: 02/10/1946, 68 y.o.   MRN: [id number]   [name] 09/16/2014, 2:52 PM

## 2014-09-16 NOTE — Op Note (Signed)
09/15/2014 - 09/16/2014  11:50 AM  PATIENT:  Dana [name]   MRN: [id number]  PRE-OPERATIVE DIAGNOSIS:  left hip fracture  POST-OPERATIVE DIAGNOSIS:  left hip fracture  PROCEDURE:  Procedure(s): ARTHROPLASTY BIPOLAR HIP  PREOPERATIVE INDICATIONS:  Dana [name] is an 68 y.o. female who was admitted 09/15/2014 with a diagnosis of Hip fracture, left and elected for surgical management.  The risks benefits and alternatives were discussed with the patient including but not limited to the risks of nonoperative treatment, versus surgical intervention including infection, bleeding, nerve injury, periprosthetic fracture, the need for revision surgery, dislocation, leg length discrepancy, blood clots, cardiopulmonary complications, morbidity, mortality, among others, and they were willing to proceed.  Predicted outcome is good, although there will be at least a six to nine month expected recovery.   OPERATIVE REPORT     SURGEON:  [name], MD    ASSISTANT:  none     ANESTHESIA:  General    COMPLICATIONS:  None.      COMPONENTS:  [employer] Acolade: Femoral stem: 2.5, Femoral Head:45, Neck:0   PROCEDURE IN DETAIL: The patient was met in the holding area and identified.  The appropriate hip  was marked at the operative site. The patient was then transported to the OR and  placed under general anesthesia.  At that point, the patient was  placed in the lateral decubitus position with the operative side up and  secured to the operating room table and all bony prominences padded.     The operative lower extremity was prepped from the iliac crest to the toes.  Sterile draping was performed.  Time out was performed prior to incision.      A routine posterolateral approach was utilized via sharp dissection  carried down to the subcutaneous tissue.  Gross bleeders were Bovie  coagulated.  The iliotibial band was identified and incised  along the length of the skin incision.  Self-retaining retractors  were  inserted.  With the hip internally rotated, the short external rotators  were identified. The piriformis was tagged with FiberWire, and the hip capsule released in a T-type fashion.  The femoral neck was exposed, and I resected the femoral neck using the appropriate jig. This was performed at approximately a thumb's breadth above the lesser trochanter.    I then exposed the deep acetabulum, cleared out any tissue including the ligamentum teres, and included the hip capsule in the FiberWire used above and below the T.    I then prepared the proximal femur using the cookie-cutter, the lateralizing reamer, and then sequentially broached.  A trial utilized, and I reduced the hip and it was found to have excellent stability with functional range of motion. The trial components were then removed.   The canal and acetabulum were thoroughly irrigated  I inserted the pressfit stem and placed the head and neck collar. The hip was reduced with appropriate force and was stable through a range of motion.   I then used a 2 mm drill bits to pass the FiberWire suture from the capsule and puriform is through the greater trochanter, and secured this. Excellent posterior capsular repair was achieved. I also closed the T in the capsule.  I then irrigated the hip copiously again with pulse lavage, and repaired the fascia with Vicryl, followed by Vicryl for the subcutaneous tissue, Monocryl for the skin, Steri-Strips and sterile gauze. The wounds were injected. The patient was then awakened and returned to PACU in stable and satisfactory condition. There were  no complications.  POST-OP PLAN: Weight bearing as tolerated. DVT px will consist of SCD's and ASA 325  [name], MD Orthopedic Surgeon [phone]   09/16/2014 11:50 AM   This note was generated using a template and dragon dictation system. In light of that, I have reviewed the note and all aspects of it are applicable to this case. Any  dictation errors are due to the computerized dictation system.

## 2014-09-17 DIAGNOSIS — S72002D Fracture of unspecified part of neck of left femur, subsequent encounter for closed fracture with routine healing: Secondary | ICD-10-CM

## 2014-09-17 LAB — CBC
HCT: 33.6 % — ABNORMAL LOW (ref 36.0–46.0)
Hemoglobin: 11 g/dL — ABNORMAL LOW (ref 12.0–15.0)
MCH: 29.8 pg (ref 26.0–34.0)
MCHC: 32.7 g/dL (ref 30.0–36.0)
MCV: 91.1 fL (ref 78.0–100.0)
Platelets: 300 10*3/uL (ref 150–400)
RBC: 3.69 MIL/uL — ABNORMAL LOW (ref 3.87–5.11)
RDW: 13.3 % (ref 11.5–15.5)
WBC: 8.6 10*3/uL (ref 4.0–10.5)

## 2014-09-17 MED ORDER — FUROSEMIDE 10 MG/ML IJ SOLN
20.0000 mg | Freq: Once | INTRAMUSCULAR | Status: AC
  Administered 2014-09-17: 20 mg via INTRAVENOUS
  Filled 2014-09-17: qty 2

## 2014-09-17 NOTE — Care Management Note (Signed)
CARE MANAGEMENT NOTE 09/17/2014  Patient:  Dana [name],[name]   Account Number:  [id number]  Date Initiated:  09/17/2014  Documentation initiated by:  Vance PeperBRADY,Yusef Lamp  Subjective/Objective Assessment:   68 yr old female admitted s/p fall with a left hip fracture. Patient had a left hip bipolar arthroplasty.     Action/Plan:   PT/OT reccommend shortterm rehab for patient. Social worker is aware.   Anticipated DC Date:  09/18/2014   Anticipated DC Plan:  SKILLED NURSING FACILITY  In-house referral  Clinical Social Worker      DC Planning Services  CM consult      [facility]   Choice offered to / List presented to:  NA   DME arranged  NA        HH arranged  NA      Status of service:  Completed, signed off Medicare Important Message given?  YES (If response is "NO", the following Medicare IM given date fields will be blank) Date Medicare IM given:  09/17/2014 Medicare IM given by:  Vance PeperBRADY,Abisai Deer Date Additional Medicare IM given:   Additional Medicare IM given by:    Discharge Disposition:  SKILLED NURSING FACILITY  Per UR Regulation:  Reviewed for med. necessity/level of care/duration of stay

## 2014-09-17 NOTE — Evaluation (Signed)
Physical Therapy Evaluation Patient Details Name: Dana [name] MRN: [id number] DOB: 02/28/1946 Today's Date: 09/17/2014   History of Present Illness  Pt. sustained L hip fx with arthroplasty bipolar. Pt. PMH CVA , HTN, arthritis, fx eye socket from fall 01/22/12.   Clinical Impression  Patient is s/p above surgery resulting in functional limitations due to the deficits listed below (see PT Problem List).  Patient will benefit from skilled PT to increase their independence and safety with mobility to allow discharge to the venue listed below.       Follow Up Recommendations SNF    Equipment Recommendations  Rolling walker with 5" wheels;3in1 (PT)    Recommendations for Other Services OT consult     Precautions / Restrictions Precautions Precautions: Posterior Hip Precaution Booklet Issued: Yes (comment) Precaution Comments: Reviewed post hip prec and posted sheet in room Restrictions Weight Bearing Restrictions: Yes LLE Weight Bearing: Weight bearing as tolerated      Mobility  Bed Mobility Overal bed mobility: Needs Assistance Bed Mobility: Supine to Sit     Supine to sit: Mod assist     General bed mobility comments: Cues for technqiue and prec; heavy mod assist to pull to sit; used bed pad to square off hips at EOB  Transfers Overall transfer level: Needs assistance Equipment used: Rolling walker (2 wheeled) Transfers: Sit to/from Stand Sit to Stand: Min assist         General transfer comment: Cues for technique, hand placement and placement of LLE for posterior hip prec  Ambulation/Gait Ambulation/Gait assistance: Min assist Ambulation Distance (Feet):  (pivot steps bed to chair) Assistive device: Rolling walker (2 wheeled) Gait Pattern/deviations: Step-to pattern     General Gait Details: Cues for gait sequence and to be aware of hip prec; min assist to move RW  Stairs            Wheelchair Mobility    Modified Rankin (Stroke Patients Only)        Balance Overall balance assessment: Needs assistance           Standing balance-Leahy Scale: Poor                               Pertinent Vitals/Pain Pain Assessment: Faces Pain Score: 2  Faces Pain Scale: Hurts little more Pain Location: L hip with sitting EOB Pain Descriptors / Indicators: Aching Pain Intervention(s): Monitored during session;Limited activity within patient's tolerance;Repositioned    Home Living Family/patient expects to be discharged to:: Skilled nursing facility Living Arrangements: Alone                    Prior Function Level of Independence: Independent               Hand Dominance   Dominant Hand: Right    Extremity/Trunk Assessment   Upper Extremity Assessment: Overall WFL for tasks assessed           Lower Extremity Assessment: LLE deficits/detail   LLE Deficits / Details: Grossly decr AROM and strentgh, limited by pain postop  Cervical / Trunk Assessment: Normal  Communication   Communication: No difficulties  Cognition Arousal/Alertness: Awake/alert Behavior During Therapy: WFL for tasks assessed/performed Overall Cognitive Status: Within Functional Limits for tasks assessed                      General Comments General comments (skin integrity, edema, etc.): Pt used incentive spirometer  throughout session; Patient requires supplemental oxygen to maintain oxygen saturations at acceptable, safe levels with physical activity.     Exercises Total Joint Exercises Ankle Circles/Pumps: AROM;Both;10 reps Quad Sets: AROM;Left;10 reps Gluteal Sets: AROM;Both;10 reps Towel Squeeze: AROM;Left;10 reps Heel Slides: AAROM;Left;10 reps Hip ABduction/ADduction: AAROM;Left;10 reps      Assessment/Plan    PT Assessment Patient needs continued PT services  PT Diagnosis Difficulty walking;Acute pain   PT Problem List Decreased strength;Decreased range of motion;Decreased activity  tolerance;Decreased balance;Decreased mobility;Decreased knowledge of use of DME;Decreased knowledge of precautions;Pain  PT Treatment Interventions DME instruction;Gait training;Functional mobility training;Therapeutic activities;Therapeutic exercise;Patient/family education   PT Goals (Current goals can be found in the Care Plan section) Acute Rehab PT Goals Patient Stated Goal: Go to SNF then home PT Goal Formulation: With patient Time For Goal Achievement: 09/24/14 Potential to Achieve Goals: Good    Frequency Min 5X/week   Barriers to discharge Decreased caregiver support      Co-evaluation               End of Session Equipment Utilized During Treatment: Gait belt;Oxygen Activity Tolerance: Patient tolerated treatment well Patient left: in chair;with call bell/phone within reach Nurse Communication: Mobility status         Time: [id number] PT Time Calculation (min): 34 min   Charges:   PT Evaluation $Initial PT Evaluation Tier I: 1 Procedure PT Treatments $Gait Training: 8-22 mins $Therapeutic Exercise: 8-22 mins   PT G Codes:          Olen PelGarrigan, Talia Hoheisel Hamff 09/17/2014, 9:54 AM Van ClinesHolly Jenefer Woerner, PT  Acute Rehabilitation Services Pager [phone] Office [phone]

## 2014-09-17 NOTE — Plan of Care (Signed)
Problem: Phase I Progression Outcomes Goal: Pain controlled with appropriate interventions Outcome: Completed/Met Date Met:  09/17/14 Goal: OOB as tolerated unless otherwise ordered Outcome: Completed/Met Date Met:  09/17/14 Goal: Incision/dressings dry and intact Outcome: Completed/Met Date Met:  09/17/14 Goal: Sutures/staples intact Outcome: Completed/Met Date Met:  09/17/14 Goal: Tubes/drains patent Outcome: Completed/Met Date Met:  09/17/14 Goal: Initial discharge plan identified Outcome: Completed/Met Date Met:  09/17/14 Goal: Voiding-avoid urinary catheter unless indicated Outcome: Completed/Met Date Met:  09/17/14 Goal: Vital signs/hemodynamically stable Outcome: Completed/Met Date Met:  09/17/14 Goal: Other Phase I Outcomes/Goals Outcome: Completed/Met Date Met:  09/17/14

## 2014-09-17 NOTE — Clinical Social Work Psychosocial (Signed)
Clinical Social Work Department BRIEF PSYCHOSOCIAL ASSESSMENT 09/17/2014  Patient:  Dana [name], Dana [name]     Account Number:  [id number]     Admit date:  09/15/2014  Clinical Social Worker:  Delrae Sawyers  Date/Time:  09/17/2014 10:49 AM  Referred by:  Physician  Date Referred:  09/17/2014 Referred for  SNF Placement   Other Referral:   none.   Interview type:  Patient Other interview type:   none.    PSYCHOSOCIAL DATA Living Status:  ALONE Admitted from facility:   Level of care:   Primary support name:  Juliann Pulse Primary support relationship to patient:  FRIEND Degree of support available:   Adequate support system.    CURRENT CONCERNS Current Concerns  Post-Acute Placement   Other Concerns:   none.    SOCIAL WORK ASSESSMENT / PLAN CSW received consult for possible SNF placement at time of discharge. CSW met with pt at bedside to discuss discharge disposition. Pt stated pt is from home alone since pt's mother passed in July of 2015. Pt informed CSW pt's closest family lives in [place], [place]. Pt stated pt's friend, Juliann Pulse, is pt's primary support. Pt infomed CSW pt prefers SNF placement in [facility]. CSW to continue to follow and assist with discharge planning needs.   Assessment/plan status:  Psychosocial Support/Ongoing Assessment of Needs Other assessment/ plan:   none.   Information/referral to community resources:   [facility] bed offers.    PATIENT'S/FAMILY'S RESPONSE TO PLAN OF CARE: Pt understanding and agreeable to CSW plan of care. Pt expressed no further questions or concerns at this time.       Lubertha Sayres, [place] [phone]) Licensed Clinical Social Worker Orthopedics [phone]) and Surgical [phone])

## 2014-09-17 NOTE — Clinical Social Work Placement (Addendum)
Clinical Social Work Department CLINICAL SOCIAL WORK PLACEMENT NOTE 09/17/2014  Patient:  Dana [name],Dana [name]  Account Number:  [id number] Admit date:  09/15/2014  Clinical Social Worker:  Mosie EpsteinEMILY S Hosie Sharman, LCSWA  Date/time:  09/17/2014 11:01 AM  Clinical Social Work is seeking post-discharge placement for this patient at the following level of care:   SKILLED NURSING   (*CSW will update this form in Epic as items are completed)   09/17/2014  Patient/family provided with [facility] of Clinical Social Work's list of facilities offering this level of care within the geographic area requested by the patient (or if unable, by the patient's family).  09/17/2014  Patient/family informed of their freedom to choose among providers that offer the needed level of care, that participate in Medicare, Medicaid or managed care program needed by the patient, have an available bed and are willing to accept the patient.  09/17/2014  Patient/family informed of [facility]' ownership interest in [facility], as well as of the fact that they are under no obligation to receive care at this facility.  PASARR submitted to EDS on 09/17/2014 PASARR number received on 09/17/2014  FL2 transmitted to all facilities in geographic area requested by pt/family on  09/17/2014 FL2 transmitted to all facilities within larger geographic area on   Patient informed that his/her managed care company has contracts with or will negotiate with  certain facilities, including the following:     Patient/family informed of bed offers received:  09/18/2014 Patient chooses bed at [facility] Physician recommends and patient chooses bed at    Patient to be transferred to  [facility] on 09/18/2014   Patient to be transferred to facility by [facility] Patient and family notified of transfer on  09/18/2014 Name of family member notified:  Pt notified at bedside.  The following physician request were entered in  Epic:   Additional Comments:  Lily Kochermily Dareen Gutzwiller, LCSWA [phone]) Licensed Clinical Social Worker Orthopedics [phone]([id number]) and Surgical [phone]([id number])

## 2014-09-17 NOTE — Progress Notes (Signed)
Patient Demographics  Dana [name], is a 68 y.o. female, DOB - 02/10/1946, UJW:[id number]  Admit date - 09/15/2014   Admitting Physician Lorretta HarpXilin Niu, MD  Outpatient Primary MD for the patient is [name],[name], MD  LOS - 2   Chief Complaint  Patient presents with  . Hip Pain        Subjective:   Dana [name] today has, No headache, No chest pain, No abdominal pain - No Nausea, No new weakness tingling or numbness, No Cough - SOB.    Assessment & Plan    1. Mech.Fall with Hip fracture, left - post ORIF L Hip by  T. Eulah PontMurphy 09-16-13, weight bearing as tolerated, ASA for DVT prophylaxis per Ortho, PT - will need Placement, mild post operative blood loss related anemia stable. No transfusion needed.    2.HTN - B.Blocker and Norvasc.    3. H/O CVA - ASA, statin for secondary prevention    4. Hyperlipidemia - on statin    5. Chronic back pain - supportive care.     Code Status: Full  Family Communication: None present  Disposition Plan: SNF   Procedures ORIF L Hip T Murphy 09-16-13   Consults  Ortho   Medications  Scheduled Meds: . amLODipine  5 mg Oral BID  . aspirin EC  325 mg Oral Q breakfast  . buPROPion  300 mg Oral Daily  . calcium-vitamin D  1 tablet Oral Daily  . cyclobenzaprine  10 mg Oral TID  . gabapentin  300 mg Oral TID  . metoprolol succinate  25 mg Oral Daily  . multivitamin with minerals  1 tablet Oral Daily  . multivitamin-lutein  1 capsule Oral Daily  . pravastatin  40 mg Oral q1800   Continuous Infusions:   PRN Meds:.acetaminophen, calcium carbonate, docusate sodium, fentaNYL, fentaNYL, HYDROcodone-acetaminophen, HYDROmorphone (DILAUDID) injection, menthol-cetylpyridinium **OR** phenol, metoCLOPramide **OR** metoCLOPramide (REGLAN)  injection, morphine injection, ondansetron (ZOFRAN) IV  DVT Prophylaxis  ASA - SCDs    Lab Results  Component Value Date   PLT 300 09/17/2014    Antibiotics     Anti-infectives    Start     Dose/Rate Route Frequency Ordered Stop   09/16/14 1600  ceFAZolin (ANCEF) IVPB 2 g/50 mL premix     2 g100 mL/hr over 30 Minutes Intravenous Every 6 hours 09/16/14 1418 09/16/14 2250          Objective:   Filed Vitals:   09/16/14 2012 09/17/14 0010 09/17/14 0440 09/17/14 0900  BP: 123/61 123/64 107/75 129/56  Pulse: 71 73 65   Temp: 98.1 F (36.7 C) 98.2 F (36.8 C) 98.3 F (36.8 C)   TempSrc: Oral Oral Oral   Resp: 16 16 16 16   SpO2: 94% 94% 93% 92%    Wt Readings from Last 3 Encounters:  07/15/12 69.673 kg (153 lb 9.6 oz)  03/15/12 71.94 kg (158 lb 9.6 oz)  01/12/12 68.04 kg (150 lb)     Intake/Output Summary (Last 24 hours) at 09/17/14 1025 Last data filed at 09/17/14 0440  Gross per 24 hour  Intake   1500 ml  Output   3375 ml  Net  -1875 ml     Physical Exam  Awake Alert, Oriented X 3, No  new F.N deficits, Normal affect [place].AT,PERRAL Supple Neck,No JVD, No cervical lymphadenopathy appriciated.  Symmetrical Chest wall movement, Good air movement bilaterally, CTAB RRR,No Gallops,Rubs or new Murmurs, No Parasternal Heave +ve B.Sounds, Abd Soft, No tenderness, No organomegaly appriciated, No rebound - guarding or rigidity. No Cyanosis, Clubbing or edema, No new Rash or bruise    Data Review   Micro Results Recent Results (from the past 240 hour(s))  MRSA PCR Screening     Status: None   Collection Time: 09/16/14  3:05 AM  Result Value Ref Range Status   MRSA by PCR NEGATIVE NEGATIVE Final    Comment:        The GeneXpert MRSA Assay (FDA approved for NASAL specimens only), is one component of a comprehensive MRSA colonization surveillance program. It is not intended to diagnose MRSA infection nor to guide or monitor treatment for MRSA infections.      Radiology Reports Dg Chest 1 View  09/15/2014   CLINICAL DATA:  Pt c/o of left hip pain after fall today in kitchen. Pt can't recall which direction she landed. No chest complaints  EXAM: CHEST - 1 VIEW  COMPARISON:  03/11/2011  FINDINGS: Cardiac silhouette is normal in size and configuration.  Normal mediastinal and hilar contours.  Lungs are clear.  No pleural effusion or pneumothorax.  Bony thorax is demineralized but intact.  IMPRESSION: No active disease.   Electronically Signed   By: Amie Portlandavid  Ormond M.D.   On: 09/15/2014 19:58   Dg Hip Complete Left  09/15/2014   CLINICAL DATA:  Pt c/o of left hip pain after fall today in kitchen. Pt can't recall which direction she landedNo chest complaints  EXAM: LEFT HIP - COMPLETE 2+ VIEW  COMPARISON:  None.  FINDINGS: There is a fracture of the subcapital left femoral neck. The distal fracture component has retracted superiorly by 2 cm. There is varus angulation.  No fracture comminution.  No other fractures. Hip joints are normally aligned. Bones are diffusely demineralized. Soft tissues are unremarkable.  IMPRESSION: Subcapital fracture of the left femoral neck, non comminuted but displaced and with varus angulation.   Electronically Signed   By: Amie Portlandavid  Ormond M.D.   On: 09/15/2014 20:01   Pelvis Portable  09/16/2014   CLINICAL DATA:  Post arthroplasty  EXAM: PORTABLE PELVIS 1-2 VIEWS  COMPARISON:  09/15/2014  FINDINGS: A single postoperative exam after left total hip arthroplasty demonstrates expected appearance. No evidence for hardware failure. Overlying soft tissue gas. No fracture line visualized.  IMPRESSION: Expected postoperative appearance after left total hip arthroplasty.   Electronically Signed   By: Christiana PellantGretchen  Green M.D.   On: 09/16/2014 12:58     CBC  Recent Labs Lab 09/15/14 2001 09/16/14 0500 09/17/14 0457  WBC 14.5* 8.9 8.6  HGB 12.9 12.1 11.0*  HCT 38.8 37.1 33.6*  PLT 402* 351 300  MCV 89.8 90.5 91.1  MCH 29.9 29.5 29.8   MCHC 33.2 32.6 32.7  RDW 12.9 13.3 13.3  LYMPHSABS 1.0  --   --   MONOABS 0.8  --   --   EOSABS 0.0  --   --   BASOSABS 0.0  --   --     Chemistries   Recent Labs Lab 09/15/14 2001 09/16/14 0500  NA 140 138  K 4.1 4.2  CL 103 102  CO2 21 23  GLUCOSE 130* 130*  BUN 25* 16  CREATININE 0.85 0.69  CALCIUM 10.0 9.0   ------------------------------------------------------------------------------------------------------------------ CrCl cannot be calculated (  Unknown ideal weight.). ------------------------------------------------------------------------------------------------------------------ No results for input(s): HGBA1C in the last 72 hours. ------------------------------------------------------------------------------------------------------------------ No results for input(s): CHOL, HDL, LDLCALC, TRIG, CHOLHDL, LDLDIRECT in the last 72 hours. ------------------------------------------------------------------------------------------------------------------ No results for input(s): TSH, T4TOTAL, T3FREE, THYROIDAB in the last 72 hours.  Invalid input(s): FREET3 ------------------------------------------------------------------------------------------------------------------ No results for input(s): VITAMINB12, FOLATE, FERRITIN, TIBC, IRON, RETICCTPCT in the last 72 hours.  Coagulation profile  Recent Labs Lab 09/15/14 2001  INR 1.05    No results for input(s): DDIMER in the last 72 hours.  Cardiac Enzymes No results for input(s): CKMB, TROPONINI, MYOGLOBIN in the last 168 hours.  Invalid input(s): CK ------------------------------------------------------------------------------------------------------------------ Invalid input(s): POCBNP     Time Spent in minutes   35   SINGH,PRASHANT K M.D on 09/17/2014 at 10:25 AM  Between 7am to 7pm - Pager - [phone]  After 7pm go to [email or web address] - password [email or web address]  And look for the night coverage person covering  for me after hours  [facility] Office  [phone]

## 2014-09-17 NOTE — Progress Notes (Signed)
Utilization review completed.  

## 2014-09-17 NOTE — Progress Notes (Signed)
    Subjective:  Patient reports pain as significantly improved.  Objective:   VITALS:   Filed Vitals:   09/16/14 1919 09/16/14 2012 09/17/14 0010 09/17/14 0440  BP: 127/59 123/61 123/64 107/75  Pulse: 68 71 73 65  Temp: 97.4 F (36.3 C) 98.1 F (36.7 C) 98.2 F (36.8 C) 98.3 F (36.8 C)  TempSrc: Oral Oral Oral Oral  Resp: 18 16 16 16   SpO2: 96% 94% 94% 93%    Physical Exam  Dressing: C/D/I  Compartments soft  SILT DP/SP/S/S/T, 2+DP, +TA/GS/EHL  LABS  Results for orders placed or performed during the hospital encounter of 09/15/14 (from the past 24 hour(s))  CBC     Status: Abnormal   Collection Time: 09/17/14  4:57 AM  Result Value Ref Range   WBC 8.6 4.0 - 10.5 K/uL   RBC 3.69 (L) 3.87 - 5.11 MIL/uL   Hemoglobin 11.0 (L) 12.0 - 15.0 g/dL   HCT 16.133.6 (L) 09.636.0 - 04.546.0 %   MCV 91.1 78.0 - 100.0 fL   MCH 29.8 26.0 - 34.0 pg   MCHC 32.7 30.0 - 36.0 g/dL   RDW 40.913.3 81.111.5 - 91.415.5 %   Platelets 300 150 - 400 K/uL     Assessment/Plan: 1 Day Post-Op   Principal Problem:   Hip fracture, left Active Problems:   History of stroke   Hypertension   Hyperlipidemia   Chronic back pain   PLAN: Weight Bearing: WBAT Post hip precautions x6wks Dressings: Keep intactu until f/u VTE prophylaxis: ASA 325 SCD's and ambulation Dispo: pending PT but likley SNF vs. AIR   Harlynn Kimbell, D 09/17/2014, 8:34 AM   Margarita Ranaimothy Kristain Hu, MD Cell [phone]

## 2014-09-17 NOTE — Evaluation (Signed)
Occupational Therapy Evaluation Patient Details Name: Dana CossCarol [name] MRN: [id number] DOB: 05/28/1946 Today's Date: 09/17/2014    History of Present Illness Pt. sustained L hip fx with arthroplasty bipolar. Pt. PMH CVA , HTN, arthritis, fx eye socket from fall 01/22/12.  (Pt. sustained L hip fx and underwent arthroplasty bipolar hi)   Clinical Impression   Pt. Is very motivated to increase ability with ADLs and mobility to eventually d/c home alone. Pt. Is in agreement that she will need ST SNF for rehab prior to home. Pt. Was interested and was ed. On use of AE for LE ADLs. Pt. Was given posterior hip sheet and was ed. On precautions. Pt. Will need further OT to maximize performance and decrease caregiver burden at next venue of care.    Follow Up Recommendations  SNF    Equipment Recommendations  None recommended by OT    Recommendations for Other Services       Precautions / Restrictions Precautions Precautions: Posterior Hip Restrictions Weight Bearing Restrictions: Yes LLE Weight Bearing: Weight bearing as tolerated      Mobility Bed Mobility                  Transfers                      Balance                                            ADL Overall ADL's : Needs assistance/impaired Eating/Feeding: Independent   Grooming: Wash/dry hands;Wash/dry face;Oral care   Upper Body Bathing: Supervision/ safety;Set up   Lower Body Bathing: Total assistance;+2 for physical assistance   Upper Body Dressing : Supervision/safety;Set up   Lower Body Dressing: Total assistance;+2 for physical assistance                 General ADL Comments: Pt. Ot sats were dropping into 80's when attempting to sit up. which limited pt. EOB activity. Pt. ed. on use of AE for LE ADLs.      Vision                 Additional Comments: Pt. has decreased visual acuity in L eye following sx 2 years post.    Perception     Praxis       Pertinent Vitals/Pain Pain Assessment: 0-10 Pain Score: 2  Pain Location:  (L hip) Pain Descriptors / Indicators: Aching Pain Intervention(s): Premedicated before session     Hand Dominance Right   Extremity/Trunk Assessment Upper Extremity Assessment Upper Extremity Assessment: Overall WFL for tasks assessed           Communication Communication Communication: No difficulties   Cognition Arousal/Alertness: Awake/alert Behavior During Therapy: WFL for tasks assessed/performed Overall Cognitive Status: Within Functional Limits for tasks assessed                     General Comments       Exercises       Shoulder Instructions      Home Living Family/patient expects to be discharged to:: Skilled nursing facility Living Arrangements: Alone                                      Prior Functioning/Environment Level of Independence:  Independent             OT Diagnosis: Acute pain   OT Problem List: Decreased strength;Decreased activity tolerance;Decreased knowledge of use of DME or AE;Decreased knowledge of precautions   OT Treatment/Interventions: Self-care/ADL training;DME and/or AE instruction;Therapeutic activities    OT Goals(Current goals can be found in the care plan section) Acute Rehab OT Goals Patient Stated Goal: Go to SNF then home OT Goal Formulation: With patient Time For Goal Achievement: 10/01/14 Potential to Achieve Goals: Good ADL Goals Pt Will Perform Grooming: with supervision Pt Will Perform Lower Body Bathing: with min assist;with adaptive equipment;sit to/from stand Pt Will Perform Lower Body Dressing: with min assist;with adaptive equipment;sit to/from stand Pt Will Transfer to Toilet: with min assist;bedside commode;ambulating  OT Frequency: Min 2X/week   Barriers to D/C: Decreased caregiver support          Co-evaluation              End of Session    Activity Tolerance: Treatment limited  secondary to medical complications (Comment) Patient left: in bed;with call bell/phone within reach   Time: [id number] OT Time Calculation (min): 56 min Charges:  OT General Charges $OT Visit: 1 Procedure OT Evaluation $Initial OT Evaluation Tier I: 1 Procedure OT Treatments $Self Care/Home Management : 38-52 mins G-Codes:    Dana [name] 09/17/2014, 8:43 AM

## 2014-09-18 ENCOUNTER — Encounter (HOSPITAL_COMMUNITY): Payer: Self-pay | Admitting: Orthopedic Surgery

## 2014-09-18 DIAGNOSIS — G8929 Other chronic pain: Secondary | ICD-10-CM

## 2014-09-18 DIAGNOSIS — M549 Dorsalgia, unspecified: Secondary | ICD-10-CM

## 2014-09-18 MED ORDER — HYDROCODONE-ACETAMINOPHEN 5-325 MG PO TABS
1.0000 | ORAL_TABLET | ORAL | Status: DC | PRN
Start: 2014-09-18 — End: 2018-05-26

## 2014-09-18 NOTE — Progress Notes (Signed)
Patient ID: Dana CossCarol [name], female   DOB: 01/03/1946, 68 y.o.   MRN: [id number]     Subjective:  Patient reports pain as mild.  Patient reports doing much better.  Denies any CP or SOB  Objective:   VITALS:   Filed Vitals:   09/17/14 0900 09/17/14 1500 09/17/14 2031 09/18/14 0538  BP: 129/56 113/47 114/45 118/47  Pulse:   81 74  Temp: 97.7 F (36.5 C) 98.2 F (36.8 C) 98.9 F (37.2 C) 98.4 F (36.9 C)  TempSrc: Axillary Oral Oral   Resp: 16  16 16   SpO2: 92% 98% 95% 97%    ABD soft Sensation intact distally Dorsiflexion/Plantar flexion intact Incision: dressing C/D/I and no drainage   Lab Results  Component Value Date   WBC 8.6 09/17/2014   HGB 11.0* 09/17/2014   HCT 33.6* 09/17/2014   MCV 91.1 09/17/2014   PLT 300 09/17/2014     Assessment/Plan: 2 Days Post-Op   Principal Problem:   Hip fracture, left Active Problems:   History of stroke   Hypertension   Hyperlipidemia   Chronic back pain   Advance diet Up with therapy  WBAT Dry dressing prn Continue plan per medicine   Haskel KhanDOUGLAS Dana [name] 09/18/2014, 10:38 AM   Margarita Ranaimothy Murphy MD [phone]

## 2014-09-18 NOTE — Discharge Instructions (Signed)
Follow posterior hip precautions  Bear weight as tolerated  Leave dressing in place and dry until follow up  Follow with Primary MD [name],[name], MD in 7 days   Get CBC, CMP, 2 view Chest X ray checked  by Primary MD next visit.    Activity: As tolerated with Full fall precautions use walker/cane & assistance as needed   Disposition Home     Diet: Heart Healthy with feeding assistance and aspiration precautions as needed.  For Heart failure patients - Check your Weight same time everyday, if you gain over 2 pounds, or you develop in leg swelling, experience more shortness of breath or chest pain, call your Primary MD immediately. Follow Cardiac Low Salt Diet and 1.8 lit/day fluid restriction.   On your next visit with your primary care physician please Get Medicines reviewed and adjusted.   Please request your Prim.MD to go over all Hospital Tests and Procedure/Radiological results at the follow up, please get all Hospital records sent to your Prim MD by signing hospital release before you go home.   If you experience worsening of your admission symptoms, develop shortness of breath, life threatening emergency, suicidal or homicidal thoughts you must seek medical attention immediately by calling 911 or calling your MD immediately  if symptoms less severe.  You Must read complete instructions/literature along with all the possible adverse reactions/side effects for all the Medicines you take and that have been prescribed to you. Take any new Medicines after you have completely understood and accpet all the possible adverse reactions/side effects.   Do not drive, operating heavy machinery, perform activities at heights, swimming or participation in water activities or provide baby sitting services if your were admitted for syncope or siezures until you have seen by Primary MD or a Neurologist and advised to do so again.  Do not drive when taking Pain medications.    Do  not take more than prescribed Pain, Sleep and Anxiety Medications  Special Instructions: If you have smoked or chewed Tobacco  in the last 2 yrs please stop smoking, stop any regular Alcohol  and or any Recreational drug use.  Wear Seat belts while driving.   Please note  You were cared for by a hospitalist during your hospital stay. If you have any questions about your discharge medications or the care you received while you were in the hospital after you are discharged, you can call the unit and asked to speak with the hospitalist on call if the hospitalist that took care of you is not available. Once you are discharged, your primary care physician will handle any further medical issues. Please note that NO REFILLS for any discharge medications will be authorized once you are discharged, as it is imperative that you return to your primary care physician (or establish a relationship with a primary care physician if you do not have one) for your aftercare needs so that they can reassess your need for medications and monitor your lab values.

## 2014-09-18 NOTE — Progress Notes (Signed)
Physical Therapy Treatment Patient Details Name: Andree CossCarol Vaeth MRN: [id number] DOB: 10/08/1946 Today's Date: 09/18/2014    History of Present Illness Pt. sustained L hip fx with arthroplasty bipolar. Pt. PMH CVA , HTN, arthritis, fx eye socket from fall 01/22/12.     PT Comments    Pt able to begin ambulation today.  Should continue to progress well at SNF.  Follow Up Recommendations  SNF     Equipment Recommendations       Recommendations for Other Services       Precautions / Restrictions Precautions Precautions: Posterior Hip Restrictions LLE Weight Bearing: Weight bearing as tolerated    Mobility  Bed Mobility               General bed mobility comments: up in recliner upon arrival  Transfers Overall transfer level: Needs assistance Equipment used: Rolling walker (2 wheeled) Transfers: Sit to/from Stand Sit to Stand: Min guard         General transfer comment: Cues for technique, hand placement and placement of LLE for posterior hip prec  Ambulation/Gait Ambulation/Gait assistance: Min assist Ambulation Distance (Feet): 12 Feet Assistive device: Rolling walker (2 wheeled) Gait Pattern/deviations: Step-to pattern;Antalgic;Decreased stance time - left Gait velocity: decreased   General Gait Details: cueing for proper RW placement and to stay within RW.  Tends to step past front of RW.  Did better as gait progressed.   Stairs            Wheelchair Mobility    Modified Rankin (Stroke Patients Only)       Balance             Standing balance-Leahy Scale: Poor                      Cognition Arousal/Alertness: Awake/alert Behavior During Therapy: WFL for tasks assessed/performed Overall Cognitive Status: Within Functional Limits for tasks assessed                      Exercises      General Comments General comments (skin integrity, edema, etc.): Pt's lunch arrived during session and pt set up in recliner.   Deferred therex.      Pertinent Vitals/Pain Pain Assessment: Faces Faces Pain Scale: Hurts little more Pain Location: L hip Pain Intervention(s): Other (comment);Premedicated before session (Pt getting pain meds as PT walked in)    Home Living                      Prior Function            PT Goals (current goals can now be found in the care plan section) Acute Rehab PT Goals Patient Stated Goal: Go to SNF then home PT Goal Formulation: With patient Progress towards PT goals: Progressing toward goals    Frequency  Min 5X/week    PT Plan Current plan remains appropriate    Co-evaluation             End of Session Equipment Utilized During Treatment: Gait belt Activity Tolerance: Patient tolerated treatment well Patient left: in chair;with call bell/phone within reach     Time: 1210-1226 PT Time Calculation (min) (ACUTE ONLY): 16 min  Charges:  $Gait Training: 8-22 mins                    G Codes:      Kelleigh Skerritt LUBECK 09/18/2014, 12:36 PM

## 2014-09-18 NOTE — Discharge Summary (Signed)
Dana [name], is a 68 y.o. female  DOB 1945-12-31  MRN [id number].  Admission date:  09/15/2014  Admitting Physician  Lorretta Harp, MD  Discharge Date:  09/18/2014   Primary MD  Gaye Alken, MD  Recommendations for primary care physician for things to follow:   Monitor clinically. Check CBC BMP in 5 - 7 days.   Admission Diagnosis  Hyperlipidemia [E78.5] Stroke [I63.9] Essential hypertension [I10] Hip fracture, left, closed, initial encounter [S72.002A]   Discharge Diagnosis  Hyperlipidemia [E78.5] Stroke [I63.9] Essential hypertension [I10] Hip fracture, left, closed, initial encounter [S72.002A]     Principal Problem:   Hip fracture, left Active Problems:   History of stroke   Hypertension   Hyperlipidemia   Chronic back pain      Past Medical History  Diagnosis Date  . Stroke   . Anemia   . Arthritis   . Hypertension   . Hyperlipidemia     Past Surgical History  Procedure Laterality Date  . Abdominal hysterectomy    . Fracture surgery  01/22/12    fx of eye socket from fall       History of present illness and  Hospital Course:     Kindly see H&P for history of present illness and admission details, please review complete Labs, Consult reports and Test reports for all details in brief  HPI  from the history and physical done on the day of admission   Dana [name] is a 68 y.o. female with past medical history of stroke, hypertension, hyperlipidemia, who presented with left hip pain after fall.  Patient reports that she had a fall and injured her left hip, when she tried to turn around in kitchen at about 5:00 to 6:00 PM. Then developed left hip pain and muscle spasm. She did not injure her head. She did not have dizziness, palpitation, weakness in her extremities before the event. She did  not loss consciousness. She had mild nausea, but no vomiting. She has some chronic right lower back pain that is unchanged. Patient does not have fever, chills, cough, chest pain, shortness of breath,abdominal pain, diarrhea, hematochezia, hematuria, leg edema, rashes. She is not on anticoagulation.   Work up in the ED demonstrates subcapital fracture of the left femoral neck, non comminuted but displaced and with varus angulation by x-ray. patient has leukocytosis with WBC 14.5, hemoglobin stable.at INR 1.05. Patient is admitted to inpatient for further evaluation and treatment. Orthopedic surgeon was consulted, suggested to transfer patient to [facility].  Review of Systems: As presented in the history of presenting illness, rest negative.   Hospital Course    1. Mech.Fall with Hip fracture, left - post ORIF L Hip by T. Eulah Pont 09-16-13, has tolerated the procedure well no complications,weight bearing as tolerated, ASA for DVT prophylaxis per Ortho, PT - will need Placement to SNF, mild post operative blood loss related anemia stable. No transfusion needed.    2.HTN - B.Blocker and Norvasc.    3. H/O CVA - ASA, statin for  secondary prevention    4. Hyperlipidemia - on statin    5. Chronic back pain - supportive care.     Discharge Condition: Stable   Follow UP  Follow-up Information    Follow up with MURPHY, TIMOTHY, D, MD In 1 week.   Specialty:  Orthopedic Surgery   Contact information:   [address] [place] [address] [phone]       Follow up with [name],[name], MD. Schedule an appointment as soon as possible for a visit in 1 week.   Specialty:  Family Medicine   Contact information:   [address] [place] [address] [phone]         Discharge Instructions  and  Discharge Medications      Discharge Instructions    Diet - low sodium heart healthy    Complete by:  As directed      Discharge  instructions    Complete by:  As directed   Follow posterior hip precautions  Bear weight as tolerated  Leave dressing in place and dry until follow up  Follow with Primary MD [name],[name], MD in 7 days   Get CBC, CMP, 2 view Chest X ray checked  by Primary MD next visit.    Activity: As tolerated with Full fall precautions use walker/cane & assistance as needed   Disposition Home     Diet: Heart Healthy with feeding assistance and aspiration precautions as needed.  For Heart failure patients - Check your Weight same time everyday, if you gain over 2 pounds, or you develop in leg swelling, experience more shortness of breath or chest pain, call your Primary MD immediately. Follow Cardiac Low Salt Diet and 1.8 lit/day fluid restriction.   On your next visit with your primary care physician please Get Medicines reviewed and adjusted.   Please request your Prim.MD to go over all Hospital Tests and Procedure/Radiological results at the follow up, please get all Hospital records sent to your Prim MD by signing hospital release before you go home.   If you experience worsening of your admission symptoms, develop shortness of breath, life threatening emergency, suicidal or homicidal thoughts you must seek medical attention immediately by calling 911 or calling your MD immediately  if symptoms less severe.  You Must read complete instructions/literature along with all the possible adverse reactions/side effects for all the Medicines you take and that have been prescribed to you. Take any new Medicines after you have completely understood and accpet all the possible adverse reactions/side effects.   Do not drive, operating heavy machinery, perform activities at heights, swimming or participation in water activities or provide baby sitting services if your were admitted for syncope or siezures until you have seen by Primary MD or a Neurologist and advised to do so again.  Do not  drive when taking Pain medications.    Do not take more than prescribed Pain, Sleep and Anxiety Medications  Special Instructions: If you have smoked or chewed Tobacco  in the last 2 yrs please stop smoking, stop any regular Alcohol  and or any Recreational drug use.  Wear Seat belts while driving.   Please note  You were cared for by a hospitalist during your hospital stay. If you have any questions about your discharge medications or the care you received while you were in the hospital after you are discharged, you can call the unit and asked to speak with the hospitalist on call if the  hospitalist that took care of you is not available. Once you are discharged, your primary care physician will handle any further medical issues. Please note that NO REFILLS for any discharge medications will be authorized once you are discharged, as it is imperative that you return to your primary care physician (or establish a relationship with a primary care physician if you do not have one) for your aftercare needs so that they can reassess your need for medications and monitor your lab values.     Increase activity slowly    Complete by:  As directed      Weight bearing as tolerated    Complete by:  As directed             Medication List    TAKE these medications        acetaminophen 500 MG tablet  Commonly known as:  TYLENOL  Take 500 mg by mouth every 4 (four) hours as needed for moderate pain. 2 daily     amLODipine 5 MG tablet  Commonly known as:  NORVASC  Take 5 mg by mouth 2 (two) times daily.     aspirin EC 325 MG tablet  Take 1 tablet (325 mg total) by mouth daily.     buPROPion 300 MG 24 hr tablet  Commonly known as:  WELLBUTRIN XL  Take 300 mg by mouth daily.     CALCIUM-VITAMIN D PO  Take 1 tablet by mouth daily.     cyclobenzaprine 5 MG tablet  Commonly known as:  FLEXERIL  Take 5 mg by mouth 3 (three) times daily.     diclofenac sodium 1 % Gel  Commonly known as:   VOLTAREN  Apply 1 application topically 4 (four) times daily. To left shoulder     docusate sodium 100 MG capsule  Commonly known as:  COLACE  Take 100 mg by mouth.     gabapentin 300 MG capsule  Commonly known as:  NEURONTIN  Take 300 mg by mouth 3 (three) times daily.     HYDROcodone-acetaminophen 5-325 MG per tablet  Commonly known as:  NORCO  Take 1-2 tablets by mouth every 4 (four) hours as needed for moderate pain.     lovastatin 40 MG tablet  Commonly known as:  MEVACOR  Take 40 mg by mouth daily.     lovastatin 40 MG tablet  Commonly known as:  MEVACOR  Take 40 mg by mouth daily.     MAALOX PO  Take 30 mLs by mouth daily as needed (diarrhea).     metoprolol succinate 25 MG 24 hr tablet  Commonly known as:  TOPROL-XL  Take 25 mg by mouth daily.     metoprolol tartrate 25 MG tablet  Commonly known as:  LOPRESSOR     multivitamin tablet  Take 1 tablet by mouth daily.     multivitamin-lutein Caps capsule  Take 1 capsule by mouth daily.          Diet and Activity recommendation: See Discharge Instructions above   Consults obtained - Ortho   Major procedures and Radiology Reports - PLEASE review detailed and final reports for all details, in brief -     ORIF L Hip by T. Eulah PontMurphy 09-16-13   Dg Chest 1 View  09/15/2014   CLINICAL DATA:  Pt c/o of left hip pain after fall today in kitchen. Pt can't recall which direction she landed. No chest complaints  EXAM: CHEST - 1 VIEW  COMPARISON:  03/11/2011  FINDINGS: Cardiac silhouette is normal in size and configuration.  Normal mediastinal and hilar contours.  Lungs are clear.  No pleural effusion or pneumothorax.  Bony thorax is demineralized but intact.  IMPRESSION: No active disease.   Electronically Signed   By: Amie Portland M.D.   On: 09/15/2014 19:58   Dg Hip Complete Left  09/15/2014   CLINICAL DATA:  Pt c/o of left hip pain after fall today in kitchen. Pt can't recall which direction she landedNo chest  complaints  EXAM: LEFT HIP - COMPLETE 2+ VIEW  COMPARISON:  None.  FINDINGS: There is a fracture of the subcapital left femoral neck. The distal fracture component has retracted superiorly by 2 cm. There is varus angulation.  No fracture comminution.  No other fractures. Hip joints are normally aligned. Bones are diffusely demineralized. Soft tissues are unremarkable.  IMPRESSION: Subcapital fracture of the left femoral neck, non comminuted but displaced and with varus angulation.   Electronically Signed   By: Amie Portland M.D.   On: 09/15/2014 20:01   Pelvis Portable  09/16/2014   CLINICAL DATA:  Post arthroplasty  EXAM: PORTABLE PELVIS 1-2 VIEWS  COMPARISON:  09/15/2014  FINDINGS: A single postoperative exam after left total hip arthroplasty demonstrates expected appearance. No evidence for hardware failure. Overlying soft tissue gas. No fracture line visualized.  IMPRESSION: Expected postoperative appearance after left total hip arthroplasty.   Electronically Signed   By: Christiana Pellant M.D.   On: 09/16/2014 12:58    Micro Results      Recent Results (from the past 240 hour(s))  MRSA PCR Screening     Status: None   Collection Time: 09/16/14  3:05 AM  Result Value Ref Range Status   MRSA by PCR NEGATIVE NEGATIVE Final    Comment:        The GeneXpert MRSA Assay (FDA approved for NASAL specimens only), is one component of a comprehensive MRSA colonization surveillance program. It is not intended to diagnose MRSA infection nor to guide or monitor treatment for MRSA infections.        Today   Subjective:   Dana [name] today has no headache,no chest abdominal pain,no new weakness tingling or numbness, feels much better.  Objective:   Blood pressure 118/47, pulse 74, temperature 98.4 F (36.9 C), temperature source Oral, resp. rate 16, SpO2 97 %.   Intake/Output Summary (Last 24 hours) at 09/18/14 0836 Last data filed at 09/18/14 1610  Gross per 24 hour  Intake   1080  ml  Output   1775 ml  Net   -695 ml    Exam Awake Alert, Oriented x 3, No new F.N deficits, Normal affect [place].AT,PERRAL Supple Neck,No JVD, No cervical lymphadenopathy appriciated.  Symmetrical Chest wall movement, Good air movement bilaterally, CTAB RRR,No Gallops,Rubs or new Murmurs, No Parasternal Heave +ve B.Sounds, Abd Soft, Non tender, No organomegaly appriciated, No rebound -guarding or rigidity. No Cyanosis, Clubbing or edema, No new Rash or bruise  Data Review   CBC w Diff: Lab Results  Component Value Date   WBC 8.6 09/17/2014   HGB 11.0* 09/17/2014   HCT 33.6* 09/17/2014   PLT 300 09/17/2014   LYMPHOPCT 7* 09/15/2014   MONOPCT 6 09/15/2014   EOSPCT 0 09/15/2014   BASOPCT 0 09/15/2014    CMP: Lab Results  Component Value Date   NA 138 09/16/2014   K 4.2 09/16/2014   CL 102 09/16/2014   CO2 23 09/16/2014   BUN 16 09/16/2014  CREATININE 0.69 09/16/2014   PROT 7.1 03/17/2011   ALBUMIN 3.4* 03/17/2011   BILITOT 0.3 03/17/2011   ALKPHOS 92 03/17/2011   AST 33 03/17/2011   ALT 48* 03/17/2011  .   Total Time in preparing paper work, data evaluation and todays exam - 35 minutes  Leroy Sea M.D on 09/18/2014 at 8:36 AM  [facility] Office  [phone]

## 2014-09-18 NOTE — Clinical Social Work Note (Signed)
Pt to be [facility] to [facility]. Pt updated at bedside regarding pt's discharge.  [facility]: [phone]  [facility]: [phone] Transportation: [address])  Marcelline Deistmily Lenorris Karger, [place] [phone]) Licensed Clinical Social Worker Orthopedics [phone]([id number]) and Surgical [phone]([id number])

## 2014-09-19 ENCOUNTER — Non-Acute Institutional Stay (SKILLED_NURSING_FACILITY): Payer: Medicare Other | Admitting: Internal Medicine

## 2014-09-19 DIAGNOSIS — E785 Hyperlipidemia, unspecified: Secondary | ICD-10-CM

## 2014-09-19 DIAGNOSIS — I10 Essential (primary) hypertension: Secondary | ICD-10-CM

## 2014-09-19 DIAGNOSIS — S72002D Fracture of unspecified part of neck of left femur, subsequent encounter for closed fracture with routine healing: Secondary | ICD-10-CM

## 2014-09-19 DIAGNOSIS — G8110 Spastic hemiplegia affecting unspecified side: Secondary | ICD-10-CM

## 2014-09-19 NOTE — Progress Notes (Signed)
MRN: [id number] Name: Dana CossCarol [name]  Sex: female Age: 68 y.o. DOB: 12/08/1945  [facility] Facility/Room: 114 Level Of Care: SNF Provider: Merrilee SeashoreALEXANDER, Isa Hitz D Emergency Contacts: Extended Emergency Contact Information Primary Emergency Contact: Ward,Shirley Address: [address]          [place] [address] [place] Home Phone: [phone] Relation: Mother Secondary Emergency Contact: Ward,Walter Address: [address]          [place], [place] [address] [place] Home Phone: [phone] Relation: Father  Code Status:   Allergies: Percodan and Oxycodone  Chief Complaint  Patient presents with  . New Admit To SNF    HPI: Patient is 68 y.o. female who is s/p left hip arthroplasty admitted to SNF for OT/PT.  Past Medical History  Diagnosis Date  . Stroke   . Anemia   . Arthritis   . Hypertension   . Hyperlipidemia     Past Surgical History  Procedure Laterality Date  . Abdominal hysterectomy    . Fracture surgery  01/22/12    fx of eye socket from fall  . Hip arthroplasty Left 09/16/2014    Procedure: ARTHROPLASTY BIPOLAR HIP;  Surgeon: Sheral Apleyimothy D Murphy, MD;  [facility];  Service: Orthopedics;  Laterality: Left;      Medication List       This list is accurate as of: 09/19/14 11:59 PM.  Always use your most recent med list.               acetaminophen 500 MG tablet  Commonly known as:  TYLENOL  Take 500 mg by mouth every 4 (four) hours as needed for moderate pain. 2 daily     amLODipine 5 MG tablet  Commonly known as:  NORVASC  Take 5 mg by mouth 2 (two) times daily.     aspirin EC 325 MG tablet  Take 1 tablet (325 mg total) by mouth daily.     buPROPion 300 MG 24 hr tablet  Commonly known as:  WELLBUTRIN XL  Take 300 mg by mouth daily.     CALCIUM-VITAMIN D PO  Take 1 tablet by mouth daily.     cyclobenzaprine 5 MG tablet  Commonly known as:  FLEXERIL  Take 5 mg by mouth 3 (three) times daily.      diclofenac sodium 1 % Gel  Commonly known as:  VOLTAREN  Apply 1 application topically 4 (four) times daily. To left shoulder     docusate sodium 100 MG capsule  Commonly known as:  COLACE  Take 100 mg by mouth.     gabapentin 300 MG capsule  Commonly known as:  NEURONTIN  Take 300 mg by mouth 3 (three) times daily.     HYDROcodone-acetaminophen 5-325 MG per tablet  Commonly known as:  NORCO  Take 1-2 tablets by mouth every 4 (four) hours as needed for moderate pain.     lovastatin 40 MG tablet  Commonly known as:  MEVACOR  Take 40 mg by mouth daily.     lovastatin 40 MG tablet  Commonly known as:  MEVACOR  Take 40 mg by mouth daily.     MAALOX PO  Take 30 mLs by mouth daily as needed (diarrhea).     metoprolol succinate 25 MG 24 hr tablet  Commonly known as:  TOPROL-XL  Take 25 mg by mouth daily.     metoprolol tartrate 25 MG tablet  Commonly known as:  LOPRESSOR  multivitamin tablet  Take 1 tablet by mouth daily.     multivitamin-lutein Caps capsule  Take 1 capsule by mouth daily.        No orders of the defined types were placed in this encounter.     There is no immunization history on file for this patient.  History  Substance Use Topics  . Smoking status: Never Smoker   . Smokeless tobacco: Never Used  . Alcohol Use: Not on file    Family history is noncontributory    Review of Systems  DATA OBTAINED: from patient GENERAL:  no fevers, fatigue, appetite changes SKIN: No itching, rash or wounds EYES: No eye pain, redness, discharge EARS: No earache, tinnitus, change in hearing NOSE: No congestion, drainage or bleeding  MOUTH/THROAT: No mouth or tooth pain, No sore throat RESPIRATORY: No cough, wheezing, SOB CARDIAC: No chest pain, palpitations, lower extremity edema  GI: No abdominal pain, No N/V/D or constipation, No heartburn or reflux  GU: No dysuria, frequency or urgency, or incontinence  MUSCULOSKELETAL: No unrelieved bone/joint  pain NEUROLOGIC: No headache, dizziness or focal weakness PSYCHIATRIC: No overt anxiety or sadness, No behavior issue.   Filed Vitals:   09/19/14 1458  BP: 117/68  Pulse: 93  Temp: 97 F (36.1 C)  Resp: 18    Physical Exam  GENERAL APPEARANCE: Alert, conversant,  No acute distress.  SKIN: No diaphoresis rash HEAD: Normocephalic, atraumatic  EYES: Conjunctiva/lids clear. Pupils round, reactive. EOMs intact.  EARS: External exam WNL, canals clear. Hearing grossly normal.  NOSE: No deformity or discharge.  MOUTH/THROAT: Lips w/o lesions  RESPIRATORY: Breathing is even, unlabored. Lung sounds are clear   CARDIOVASCULAR: Heart RRR no murmurs, rubs or gallops. No peripheral edema.   GASTROINTESTINAL: Abdomen is soft, non-tender, not distended w/ normal bowel sounds. GENITOURINARY: Bladder non tender, not distended  MUSCULOSKELETAL: No abnormal joints or musculature NEUROLOGIC:  Cranial nerves 2-12 grossly intact  PSYCHIATRIC: Mood and affect appropriate to situation, no behavioral issues  Patient Active Problem List   Diagnosis Date Noted  . Hip fracture, left 09/15/2014  . Hip fracture requiring operative repair 09/15/2014  . Hip fracture 09/15/2014  . Chronic back pain 09/15/2014  . History of stroke   . Hypertension   . Hyperlipidemia   . Essential hypertension   . Spastic hemiplegia affecting nondominant side 01/12/2012    CBC    Component Value Date/Time   WBC 8.6 09/17/2014 0457   RBC 3.69* 09/17/2014 0457   HGB 11.0* 09/17/2014 0457   HCT 33.6* 09/17/2014 0457   PLT 300 09/17/2014 0457   MCV 91.1 09/17/2014 0457   LYMPHSABS 1.0 09/15/2014 2001   MONOABS 0.8 09/15/2014 2001   EOSABS 0.0 09/15/2014 2001   BASOSABS 0.0 09/15/2014 2001    CMP     Component Value Date/Time   NA 138 09/16/2014 0500   K 4.2 09/16/2014 0500   CL 102 09/16/2014 0500   CO2 23 09/16/2014 0500   GLUCOSE 130* 09/16/2014 0500   BUN 16 09/16/2014 0500   CREATININE 0.69 09/16/2014  0500   CALCIUM 9.0 09/16/2014 0500   PROT 7.1 03/17/2011 0646   ALBUMIN 3.4* 03/17/2011 0646   AST 33 03/17/2011 0646   ALT 48* 03/17/2011 0646   ALKPHOS 92 03/17/2011 0646   BILITOT 0.3 03/17/2011 0646   GFRNONAA 86* 09/16/2014 0500   GFRAA >90 09/16/2014 0500    Assessment and Plan  Hip fracture requiring operative repair Mech.Fall with Hip fracture, left - post  ORIF L Hip by T. Eulah Pont 09-16-13, has tolerated the procedure well no complications,weight bearing as tolerated, ASA for DVT prophylaxis per Ortho, PT - will need Placement to SNF, mild post operative blood loss related anemia stable. No transfusion needed.   Spastic hemiplegia affecting nondominant side 2/2 prior CVA;ASA, statin for secondary prevention  Essential hypertension Continue norvasc, metoprolol  Hyperlipidemia Pt on mevacor    Jospeh Mangel, Randon Goldsmith, MD

## 2014-09-23 ENCOUNTER — Encounter: Payer: Self-pay | Admitting: Internal Medicine

## 2014-09-23 NOTE — Assessment & Plan Note (Signed)
Pt on mevacor

## 2014-09-23 NOTE — Assessment & Plan Note (Signed)
2/2 prior CVA;ASA, statin for secondary prevention

## 2014-09-23 NOTE — Assessment & Plan Note (Signed)
Continue norvasc, metoprolol  °

## 2014-09-23 NOTE — Assessment & Plan Note (Signed)
Mech.Fall with Hip fracture, left - post ORIF L Hip by T. Eulah PontMurphy 09-16-13, has tolerated the procedure well no complications,weight bearing as tolerated, ASA for DVT prophylaxis per Ortho, PT - will need Placement to SNF, mild post operative blood loss related anemia stable. No transfusion needed.

## 2014-09-24 ENCOUNTER — Encounter: Payer: Self-pay | Admitting: Internal Medicine

## 2014-09-24 NOTE — Progress Notes (Signed)
Patient ID: Dana CossCarol [name], female   DOB: 09/27/1946, 68 y.o.   MRN: [id number]    This encounter was created in error - please disregard. This encounter was created in error - please disregard.

## 2014-09-26 ENCOUNTER — Non-Acute Institutional Stay (SKILLED_NURSING_FACILITY): Payer: Medicare Other | Admitting: Internal Medicine

## 2014-09-26 ENCOUNTER — Encounter: Payer: Self-pay | Admitting: Internal Medicine

## 2014-09-26 DIAGNOSIS — G8929 Other chronic pain: Secondary | ICD-10-CM

## 2014-09-26 DIAGNOSIS — M549 Dorsalgia, unspecified: Secondary | ICD-10-CM

## 2014-09-26 DIAGNOSIS — I10 Essential (primary) hypertension: Secondary | ICD-10-CM

## 2014-09-26 DIAGNOSIS — E785 Hyperlipidemia, unspecified: Secondary | ICD-10-CM

## 2014-09-26 DIAGNOSIS — G8110 Spastic hemiplegia affecting unspecified side: Secondary | ICD-10-CM

## 2014-09-26 DIAGNOSIS — S72002D Fracture of unspecified part of neck of left femur, subsequent encounter for closed fracture with routine healing: Secondary | ICD-10-CM

## 2014-09-26 NOTE — Progress Notes (Signed)
MRN: [id number] Name: Dana [name]  Sex: female Age: 68 y.o. DOB: 03/06/1946  [facility] Facility[facility] Level Of Care: SNF Provider: Merrilee [name], Dana [name] Emergency Contacts: Extended Emergency Contact Information Primary Emergency Contact: Ward,Shirley Address: [name]          [place], [place] [address] [place] Home Phone: [phone] Relation: Mother Secondary Emergency Contact: Ward,Walter Address: [address]          [place], [place] [address] [place] Home Phone: [phone] Relation: Father  Code Status:DNR   Allergies: Percodan and Oxycodone  Chief Complaint  Patient presents with  . Discharge Note    HPI: Patient is 68 y.o. female who was admitted s/p hip repair who is now ready to go home.  Past Medical History  Diagnosis Date  . Stroke   . Anemia   . Arthritis   . Hypertension   . Hyperlipidemia     Past Surgical History  Procedure Laterality Date  . Abdominal hysterectomy    . Fracture surgery  01/22/12    fx of eye socket from fall  . Hip arthroplasty Left 09/16/2014    Procedure: ARTHROPLASTY BIPOLAR HIP;  Surgeon: Sheral Apleyimothy [name] Murphy, MD;  [facility];  Service: Orthopedics;  Laterality: Left;      Medication List       This list is accurate as of: 09/26/14  4:16 PM.  Always use your most recent med list.               acetaminophen 500 MG tablet  Commonly known as:  TYLENOL  Take 500 mg by mouth every 4 (four) hours as needed for moderate pain. 2 daily     amLODipine 5 MG tablet  Commonly known as:  NORVASC  Take 5 mg by mouth 2 (two) times daily.     aspirin EC 325 MG tablet  Take 1 tablet (325 mg total) by mouth daily.     buPROPion 300 MG 24 hr tablet  Commonly known as:  WELLBUTRIN XL  Take 300 mg by mouth daily.     CALCIUM-VITAMIN [name] PO  Take 1 tablet by mouth daily.     cyclobenzaprine 5 MG tablet  Commonly known as:  FLEXERIL  Take 5 mg by mouth 3 (three) times daily.      diclofenac sodium 1 % Gel  Commonly known as:  VOLTAREN  Apply 1 application topically 4 (four) times daily. To left shoulder     docusate sodium 100 MG capsule  Commonly known as:  COLACE  Take 100 mg by mouth.     gabapentin 300 MG capsule  Commonly known as:  NEURONTIN  Take 300 mg by mouth 3 (three) times daily.     HYDROcodone-acetaminophen 5-325 MG per tablet  Commonly known as:  NORCO  Take 1-2 tablets by mouth every 4 (four) hours as needed for moderate pain.     lovastatin 40 MG tablet  Commonly known as:  MEVACOR  Take 40 mg by mouth daily.     MAALOX PO  Take 30 mLs by mouth daily as needed (diarrhea).     metoprolol succinate 25 MG 24 hr tablet  Commonly known as:  TOPROL-XL  Take 25 mg by mouth daily.     multivitamin tablet  Take 1 tablet by mouth daily.     multivitamin-lutein Caps capsule  Take 1 capsule by mouth daily.        No orders  of the defined types were placed in this encounter.     There is no immunization history on file for this patient.  History  Substance Use Topics  . Smoking status: Never Smoker   . Smokeless tobacco: Never Used  . Alcohol Use: Not on file    Filed Vitals:   09/26/14 1559  BP: 124/72  Pulse: 87  Temp: 98.1 F (36.7 C)  Resp: 20    Physical Exam  GENERAL APPEARANCE: Alert, conversant. No acute distress.  HEENT: Unremarkable. RESPIRATORY: Breathing is even, unlabored. Lung sounds are clear   CARDIOVASCULAR: Heart RRR no murmurs, rubs or gallops. No peripheral edema.  GASTROINTESTINAL: Abdomen is soft, non-tender, not distended w/ normal bowel sounds.  NEUROLOGIC: Cranial nerves 2-12 grossly intact  Patient Active Problem List   Diagnosis Date Noted  . Hip fracture, left 09/15/2014  . Hip fracture requiring operative repair 09/15/2014  . Hip fracture 09/15/2014  . Chronic back pain 09/15/2014  . History of stroke   . Hypertension   . Hyperlipidemia   . Essential hypertension   . Spastic  hemiplegia affecting nondominant side 01/12/2012    CBC    Component Value Date/Time   WBC 8.6 09/17/2014 0457   RBC 3.69* 09/17/2014 0457   HGB 11.0* 09/17/2014 0457   HCT 33.6* 09/17/2014 0457   PLT 300 09/17/2014 0457   MCV 91.1 09/17/2014 0457   LYMPHSABS 1.0 09/15/2014 2001   MONOABS 0.8 09/15/2014 2001   EOSABS 0.0 09/15/2014 2001   BASOSABS 0.0 09/15/2014 2001    CMP     Component Value Date/Time   NA 138 09/16/2014 0500   K 4.2 09/16/2014 0500   CL 102 09/16/2014 0500   CO2 23 09/16/2014 0500   GLUCOSE 130* 09/16/2014 0500   BUN 16 09/16/2014 0500   CREATININE 0.69 09/16/2014 0500   CALCIUM 9.0 09/16/2014 0500   PROT 7.1 03/17/2011 0646   ALBUMIN 3.4* 03/17/2011 0646   AST 33 03/17/2011 0646   ALT 48* 03/17/2011 0646   ALKPHOS 92 03/17/2011 0646   BILITOT 0.3 03/17/2011 0646   GFRNONAA 86* 09/16/2014 0500   GFRAA >90 09/16/2014 0500    Assessment and Plan  Pt is stable for discharge home with HH/OT/PT/nursing.  Margit HanksALEXANDER, Bretta Fees D, MD

## 2014-12-05 ENCOUNTER — Other Ambulatory Visit: Payer: Self-pay | Admitting: Gastroenterology

## 2014-12-11 ENCOUNTER — Other Ambulatory Visit: Payer: Self-pay | Admitting: Gastroenterology

## 2014-12-20 ENCOUNTER — Encounter (HOSPITAL_COMMUNITY): Payer: Self-pay | Admitting: *Deleted

## 2014-12-25 ENCOUNTER — Ambulatory Visit (HOSPITAL_COMMUNITY): Payer: Medicare Other | Admitting: Anesthesiology

## 2014-12-25 ENCOUNTER — Ambulatory Visit (HOSPITAL_COMMUNITY)
Admission: RE | Admit: 2014-12-25 | Discharge: 2014-12-25 | Disposition: A | Discharge status: Home / Self Care | Payer: Medicare Other | Source: Ambulatory Visit | Attending: Gastroenterology | Admitting: Gastroenterology

## 2014-12-25 ENCOUNTER — Encounter (HOSPITAL_COMMUNITY)
Admission: RE | Disposition: A | Discharge status: Home / Self Care | Payer: Medicare Other | Source: Ambulatory Visit | Attending: Gastroenterology

## 2014-12-25 ENCOUNTER — Encounter (HOSPITAL_COMMUNITY): Payer: Self-pay | Admitting: *Deleted

## 2014-12-25 DIAGNOSIS — Z8673 Personal history of transient ischemic attack (TIA), and cerebral infarction without residual deficits: Secondary | ICD-10-CM | POA: Insufficient documentation

## 2014-12-25 DIAGNOSIS — F329 Major depressive disorder, single episode, unspecified: Secondary | ICD-10-CM | POA: Diagnosis not present

## 2014-12-25 DIAGNOSIS — F419 Anxiety disorder, unspecified: Secondary | ICD-10-CM | POA: Diagnosis not present

## 2014-12-25 DIAGNOSIS — Z96642 Presence of left artificial hip joint: Secondary | ICD-10-CM | POA: Insufficient documentation

## 2014-12-25 DIAGNOSIS — Z1211 Encounter for screening for malignant neoplasm of colon: Secondary | ICD-10-CM | POA: Insufficient documentation

## 2014-12-25 DIAGNOSIS — I10 Essential (primary) hypertension: Secondary | ICD-10-CM | POA: Diagnosis not present

## 2014-12-25 DIAGNOSIS — E780 Pure hypercholesterolemia: Secondary | ICD-10-CM | POA: Diagnosis not present

## 2014-12-25 DIAGNOSIS — G629 Polyneuropathy, unspecified: Secondary | ICD-10-CM | POA: Diagnosis not present

## 2014-12-25 DIAGNOSIS — K589 Irritable bowel syndrome without diarrhea: Secondary | ICD-10-CM | POA: Insufficient documentation

## 2014-12-25 HISTORY — DX: Depression, unspecified: F32.A

## 2014-12-25 HISTORY — PX: COLONOSCOPY WITH PROPOFOL: SHX5780

## 2014-12-25 HISTORY — DX: Major depressive disorder, single episode, unspecified: F32.9

## 2014-12-25 HISTORY — DX: Cardiac murmur, unspecified: R01.1

## 2014-12-25 HISTORY — DX: Personal history of other medical treatment: Z92.89

## 2014-12-25 SURGERY — COLONOSCOPY WITH PROPOFOL
Anesthesia: Monitor Anesthesia Care

## 2014-12-25 MED ORDER — FENTANYL CITRATE 0.05 MG/ML IJ SOLN
INTRAMUSCULAR | Status: DC | PRN
  Administered 2014-12-25 (×4): 25 ug via INTRAVENOUS

## 2014-12-25 MED ORDER — PROPOFOL INFUSION 10 MG/ML OPTIME
INTRAVENOUS | Status: DC | PRN
  Administered 2014-12-25: 120 ug/kg/min via INTRAVENOUS

## 2014-12-25 MED ORDER — PROPOFOL 10 MG/ML IV BOLUS
INTRAVENOUS | Status: AC
  Filled 2014-12-25: qty 20

## 2014-12-25 MED ORDER — FENTANYL CITRATE 0.05 MG/ML IJ SOLN: 25.0000 ug | INTRAMUSCULAR | Status: DC | PRN

## 2014-12-25 MED ORDER — MIDAZOLAM HCL 2 MG/2ML IJ SOLN
INTRAMUSCULAR | Status: AC
  Filled 2014-12-25: qty 2

## 2014-12-25 MED ORDER — FENTANYL CITRATE 0.05 MG/ML IJ SOLN
INTRAMUSCULAR | Status: AC
  Filled 2014-12-25: qty 2

## 2014-12-25 MED ORDER — MIDAZOLAM HCL 5 MG/5ML IJ SOLN
INTRAMUSCULAR | Status: DC | PRN
  Administered 2014-12-25 (×2): 0.5 mg via INTRAVENOUS
  Administered 2014-12-25: 1 mg via INTRAVENOUS

## 2014-12-25 MED ORDER — SODIUM CHLORIDE 0.9 % IV SOLN: INTRAVENOUS | Status: DC

## 2014-12-25 MED ORDER — LIDOCAINE HCL (CARDIAC) 20 MG/ML IV SOLN
INTRAVENOUS | Status: AC
  Filled 2014-12-25: qty 5

## 2014-12-25 MED ORDER — LIDOCAINE HCL 1 % IJ SOLN
INTRAMUSCULAR | Status: DC | PRN
  Administered 2014-12-25: 30 mg via INTRADERMAL

## 2014-12-25 MED ORDER — LACTATED RINGERS IV SOLN
INTRAVENOUS | Status: DC
  Administered 2014-12-25 (×2): via INTRAVENOUS

## 2014-12-25 SURGICAL SUPPLY — 21 items

## 2014-12-25 NOTE — Anesthesia Preprocedure Evaluation (Addendum)
Anesthesia Evaluation  Patient identified by MRN, date of birth, ID band Patient awake    Reviewed: Allergy & Precautions, H&P , NPO status , Patient's Chart, lab work & pertinent test results, reviewed documented beta blocker date and time   Airway Mallampati: II  TM Distance: >3 FB Neck ROM: full    Dental no notable dental hx. (+) Teeth Intact, Dental Advisory Given   Pulmonary neg pulmonary ROS,  breath sounds clear to auscultation  Pulmonary exam normal       Cardiovascular Exercise Tolerance: Good hypertension, Pt. on medications and Pt. on home beta blockers Rhythm:regular Rate:Normal     Neuro/Psych Depression Spastic hemiplegia CVA, No Residual Symptoms negative psych ROS   GI/Hepatic negative GI ROS, Neg liver ROS,   Endo/Other  negative endocrine ROS  Renal/GU negative Renal ROS  negative genitourinary   Musculoskeletal   Abdominal   Peds  Hematology negative hematology ROS (+)   Anesthesia Other Findings   Reproductive/Obstetrics negative OB ROS                            Anesthesia Physical Anesthesia Plan  ASA: III  Anesthesia Plan: MAC   Post-op Pain Management:    Induction:   Airway Management Planned:   Additional Equipment:   Intra-op Plan:   Post-operative Plan:   Informed Consent: I have reviewed the patients History and Physical, chart, labs and discussed the procedure including the risks, benefits and alternatives for the proposed anesthesia with the patient or authorized representative who has indicated his/her understanding and acceptance.   Dental Advisory Given  Plan Discussed with: CRNA and Surgeon  Anesthesia Plan Comments:         Anesthesia Quick Evaluation

## 2014-12-25 NOTE — Op Note (Signed)
Procedure: Screening colonoscopy. Normal screening colonoscopy performed on 10/19/2001  Endoscopist: Danise EdgeMartin Jirah Rider  Premedication: Propofol administered by anesthesia  Procedure: The patient was placed in the supine position. The Pentax pediatric colonoscope was introduced into the rectum and advanced to the cecum. A normal-appearing ileocecal valve was intubated and the terminal ileum inspected. A normal-appearing appendiceal orifice was identified. Colonic preparation for the exam today was good. Withdrawal time was 9 minutes  Rectum. Normal.  Sigmoid colon and descending colon. Normal  Splenic flexure. Normal  Transverse colon. Normal  Hepatic flexure. Normal  Ascending colon. Normal  Cecum and ileocecal valve. Normal  Terminal ileum. Normal  Assessment: Normal screening colonoscopy

## 2014-12-25 NOTE — H&P (Signed)
  Procedure: Repeat screening colonoscopy. Normal screening colonoscopy performed on 10/19/2001  History: The patient is a 69 year old female born 06/26/1946. She is scheduled to undergo a repeat screening colonoscopy with polypectomy to prevent colon cancer.  In November 2015, the patient suffered a left hip fracture and has undergone a total left hip replacement surgery. It is painful if she lies on her left side.  Past medical history: T a 8-BSO. Bunionectomy. Left hip replacement surgery. Left wrist surgery. Hypercholesterolemia. Peripheral neuropathy. Degenerative disc disease. Irritable bowel syndrome. Chronic anxiety. Hypertension. Stroke diagnosed in May 2012.  Medication allergies: Percodan causes nausea and vomiting  Exam: The patient is alert and lying comfortably on the endoscopy stretcher. Abdomen is soft and nontender to palpation. Lungs are clear to auscultation. Cardiac exam reveals a regular rhythm.  Plan: Proceed with screening colonoscopy

## 2014-12-25 NOTE — Anesthesia Postprocedure Evaluation (Signed)
  Anesthesia Post-op Note  Patient: Dana CossCarol [name]  Procedure(s) Performed: Procedure(s) (LRB): COLONOSCOPY WITH PROPOFOL (N/A)  Patient Location: PACU  Anesthesia Type: MAC  Level of Consciousness: awake and alert   Airway and Oxygen Therapy: Patient Spontanous Breathing  Post-op Pain: mild  Post-op Assessment: Post-op Vital signs reviewed, Patient's Cardiovascular Status Stable, Respiratory Function Stable, Patent Airway and No signs of Nausea or vomiting  Last Vitals:  Filed Vitals:   12/25/14 1250  BP: 126/54  Pulse: 80  Temp:   Resp: 14    Post-op Vital Signs: stable   Complications: No apparent anesthesia complications

## 2014-12-25 NOTE — Transfer of Care (Signed)
Immediate Anesthesia Transfer of Care Note  Patient: Dana [name]  Procedure(s) Performed: Procedure(s): COLONOSCOPY WITH PROPOFOL (N/A)  Patient Location: PACU and Endoscopy Unit  Anesthesia Type:MAC  Level of Consciousness: awake, alert , oriented and patient cooperative  Airway & Oxygen Therapy: Patient Spontanous Breathing and Patient connected to face mask oxygen  Post-op Assessment: Report given to RN and Post -op Vital signs reviewed and stable  Post vital signs: Reviewed and stable  Last Vitals:  Filed Vitals:   12/25/14 1116  BP: 147/65  Temp: 37.1 C  Resp: 19    Complications: No apparent anesthesia complications

## 2014-12-26 ENCOUNTER — Encounter (HOSPITAL_COMMUNITY): Payer: Self-pay | Admitting: Gastroenterology

## 2017-11-15 ENCOUNTER — Emergency Department (HOSPITAL_BASED_OUTPATIENT_CLINIC_OR_DEPARTMENT_OTHER)
Admission: EM | Admit: 2017-11-15 | Discharge: 2017-11-16 | Disposition: A | Discharge status: Home / Self Care | Payer: Medicare Other | Attending: Emergency Medicine | Admitting: Emergency Medicine

## 2017-11-15 ENCOUNTER — Encounter (HOSPITAL_BASED_OUTPATIENT_CLINIC_OR_DEPARTMENT_OTHER): Payer: Self-pay | Admitting: Emergency Medicine

## 2017-11-15 ENCOUNTER — Other Ambulatory Visit: Payer: Self-pay

## 2017-11-15 ENCOUNTER — Emergency Department (HOSPITAL_BASED_OUTPATIENT_CLINIC_OR_DEPARTMENT_OTHER): Payer: Medicare Other

## 2017-11-15 DIAGNOSIS — Y999 Unspecified external cause status: Secondary | ICD-10-CM | POA: Insufficient documentation

## 2017-11-15 DIAGNOSIS — Z96642 Presence of left artificial hip joint: Secondary | ICD-10-CM | POA: Diagnosis not present

## 2017-11-15 DIAGNOSIS — I10 Essential (primary) hypertension: Secondary | ICD-10-CM | POA: Diagnosis not present

## 2017-11-15 DIAGNOSIS — Y939 Activity, unspecified: Secondary | ICD-10-CM | POA: Diagnosis not present

## 2017-11-15 DIAGNOSIS — Y929 Unspecified place or not applicable: Secondary | ICD-10-CM | POA: Insufficient documentation

## 2017-11-15 DIAGNOSIS — S62615A Displaced fracture of proximal phalanx of left ring finger, initial encounter for closed fracture: Secondary | ICD-10-CM | POA: Diagnosis not present

## 2017-11-15 DIAGNOSIS — W231XXA Caught, crushed, jammed, or pinched between stationary objects, initial encounter: Secondary | ICD-10-CM | POA: Insufficient documentation

## 2017-11-15 DIAGNOSIS — Z79899 Other long term (current) drug therapy: Secondary | ICD-10-CM | POA: Diagnosis not present

## 2017-11-15 DIAGNOSIS — S6992XA Unspecified injury of left wrist, hand and finger(s), initial encounter: Secondary | ICD-10-CM | POA: Diagnosis present

## 2017-11-15 MED ORDER — HYDROCODONE-ACETAMINOPHEN 5-325 MG PO TABS
1.0000 | ORAL_TABLET | Freq: Once | ORAL | Status: AC
Start: 2017-11-16 — End: 2017-11-16
  Administered 2017-11-16: 1 via ORAL
  Filled 2017-11-15: qty 1

## 2017-11-15 NOTE — ED Triage Notes (Signed)
Pt c/o pain to LT 4th and 5th digits s/p closed in door (in house) 4 days ago; swelling and discoloration noted.

## 2017-11-15 NOTE — ED Provider Notes (Signed)
[facility] EMERGENCY DEPARTMENT Provider Note   CSN: [id number] Arrival date & time: 11/15/17  2047     History   Chief Complaint Chief Complaint  Patient presents with  . Hand Pain    HPI Dana [name] is a 72 y.o. female who presents for evaluation of left fourth digit pain that began approximately 4-5 days ago.  Patient reports that she excellently got her hand caught in a door and has since experienced pain to the left fourth and fifth digits.  Patient reports she has been taking Tylenol with minimal improvement in pain.  Patient reported she noticed some discoloration swelling of the fingers and worsening pain, prompting ED visit.  Patient denies any fevers, numbness/weakness.  The history is provided by the patient.    Past Medical History:  Diagnosis Date  . Anemia    none since hysterectomy  . Arthritis    LlTHA 11'15(had a fall)-"remains tender"  . Depression   . Heart murmur    history of  . Hyperlipidemia   . Hypertension   . Stroke [facility])    4-5 yrs ago(left sided weakness) -no residual  . Transfusion history    age 35- "hysterectomy"    Patient Active Problem List   Diagnosis Date Noted  . Hip fracture, left (HCC) 09/15/2014  . Hip fracture requiring operative repair (HCC) 09/15/2014  . Hip fracture (HCC) 09/15/2014  . Chronic back pain 09/15/2014  . History of stroke   . Hypertension   . Hyperlipidemia   . Essential hypertension   . Spastic hemiplegia affecting nondominant side (HCC) 01/12/2012    Past Surgical History:  Procedure Laterality Date  . ABDOMINAL HYSTERECTOMY    . COLONOSCOPY WITH PROPOFOL N/A 12/25/2014   Procedure: COLONOSCOPY WITH PROPOFOL;  Surgeon: Charolett Bumpers, MD;  Location: [facility] ENDOSCOPY;  Service: Endoscopy;  Laterality: N/A;  . FRACTURE SURGERY  01/22/12   fx of eye socket from fall-"retained plate"  . HAND SURGERY Left    nerve release -many yrs ago  . HIP ARTHROPLASTY Left 09/16/2014   Procedure: ARTHROPLASTY  BIPOLAR HIP;  Surgeon: Sheral Apley, MD;  Location: [facility] OR;  Service: Orthopedics;  Laterality: Left;    OB History    No data available       Home Medications    Prior to Admission medications   Medication Sig Start Date End Date Taking? Authorizing Provider  acetaminophen (TYLENOL) 500 MG tablet Take 500 mg by mouth every 4 (four) hours as needed for moderate pain. 2 daily    [provider]  amLODipine (NORVASC) 5 MG tablet Take 5 mg by mouth 2 (two) times daily.    [provider]  aspirin EC 325 MG tablet Take 1 tablet (325 mg total) by mouth daily. 09/16/14   Sheral Apley, MD  buPROPion (WELLBUTRIN XL) 300 MG 24 hr tablet Take 300 mg by mouth daily.    [provider]  Calcium Carbonate Antacid (MAALOX PO) Take 30 mLs by mouth daily as needed (diarrhea).     [provider]  CALCIUM-VITAMIN D PO Take 1 tablet by mouth daily.    [provider]  cyclobenzaprine (FLEXERIL) 5 MG tablet Take 5 mg by mouth 3 (three) times daily.    [provider]  diclofenac sodium (VOLTAREN) 1 % GEL Apply 1 application topically 4 (four) times daily. To left shoulder    [provider]  docusate sodium (COLACE) 100 MG capsule Take 100 mg by mouth.  [provider]  gabapentin (NEURONTIN) 300 MG capsule Take 300 mg by mouth 3 (three) times daily.    [provider]  HYDROcodone-acetaminophen (NORCO) 5-325 MG per tablet Take 1-2 tablets by mouth every 4 (four) hours as needed for moderate pain. 09/18/14   Leroy Sea, MD  lovastatin (MEVACOR) 40 MG tablet Take 40 mg by mouth daily.    [provider]  metoprolol succinate (TOPROL-XL) 25 MG 24 hr tablet Take 25 mg by mouth daily.    [provider]  Multiple Vitamin (MULTIVITAMIN) tablet Take 1 tablet by mouth daily.    [provider]  multivitamin-lutein (OCUVITE-LUTEIN) CAPS Take 1 capsule by mouth daily.    [provider]     Family History Family History  Problem Relation Age of Onset  . Hyperlipidemia Mother   . Hypertension Mother   . Diabetes Mother   . Heart disease Mother        aortic vavle  replaced  . Hypertension Father   . Hyperlipidemia Father   . Heart disease Father        aortic valve replaced  . Alzheimer's disease Father     Social History Social History   Tobacco Use  . Smoking status: Never Smoker  . Smokeless tobacco: Never Used  Substance Use Topics  . Alcohol use: Yes    Comment: beer  . Drug use: No     Allergies   Percodan [oxycodone-aspirin] and Oxycodone   Review of Systems Review of Systems  Musculoskeletal:       Left fourth and fifth digit pain.  Skin: Positive for color change.  Neurological: Negative for weakness and numbness.     Physical Exam Updated Vital Signs BP (!) 141/85 (BP Location: Right Arm)   Pulse (!) 109   Temp 98.7 F (37.1 C) (Oral)   Resp 16   Ht 5' 3.5" (1.613 m)   Wt 62.6 kg (138 lb)   SpO2 94%   BMI 24.06 kg/m   Physical Exam  Constitutional: She appears well-developed and well-nourished.  HENT:  Head: Normocephalic and atraumatic.  Eyes: Conjunctivae and EOM are normal. Right eye exhibits no discharge. Left eye exhibits no discharge. No scleral icterus.  Cardiovascular:  Pulses:      Radial pulses are 2+ on the right side, and 2+ on the left side.  Pulmonary/Chest: Effort normal.  Musculoskeletal:  No tenderness palpation to the left wrist.  No snuffbox tenderness.  Full range of motion of left wrist without any difficulty.  No tenderness palpation to the carpals of the left hand.  Tenderness palpation to the proximal left fourth digit that extends distally.  Extension intact.  Limited flexion secondary to patient's pain.  Limited flexion/extension of DIP joint secondary to patient's pain.  Patient can almost make a fist but has difficulty flexing her entire finger down to touch the palmar aspect of the hand secondary  to her pain.   Neurological: She is alert.  Sensation intact along major nerve distributions of the hand  Skin: Skin is warm and dry. Capillary refill takes less than 2 seconds.  Good distal cap refill. LUE is not dusky in appearance or cool to touch.  Psychiatric: She has a normal mood and affect. Her speech is normal and behavior is normal.  Nursing note and vitals reviewed.    ED Treatments / Results  Labs (all labs ordered are listed, but only abnormal results are displayed) Labs Reviewed - No data to display  EKG  EKG Interpretation None       Radiology Dg Hand Complete Left  Result Date: 11/15/2017 CLINICAL DATA:  Left hand pain after injury. Shut fourth and fifth digits in bathroom door. EXAM: LEFT HAND - COMPLETE 3+ VIEW COMPARISON:  None. FINDINGS: Distal phalanges are excluded on the lateral view. Acute oblique fracture of the fourth proximal phalanx with minimal angulation. No intra-articular extension. Associated soft tissue edema of the fourth digit. No additional acute fracture. Osteoarthritis scattered throughout the digits. Bones are under mineralized. IMPRESSION: Oblique fracture fourth proximal phalanx without intra-articular extension. Electronically Signed   By: Rubye OaksMelanie  Ehinger M.D.   On: 11/15/2017 21:37    Procedures Procedures (including critical care time)  Medications Ordered in ED Medications  HYDROcodone-acetaminophen (NORCO/VICODIN) 5-325 MG per tablet 1 tablet (1 tablet Oral Given 11/16/17 0005)     Initial Impression / Assessment and Plan / ED Course  I have reviewed the triage vital signs and the nursing notes.  Pertinent labs & imaging results that were available during my care of the patient were reviewed by me and considered in my medical decision making (see chart for details).     72 y.o. F who presents for evaluation of left fourth fifth digit hand pain after getting it stuck in a door.  Patient reports that she has had continued pain  since then.  Has been taking Tylenol with minimal improvement. Patient is afebrile, non-toxic appearing, sitting comfortably on examination table. Vital signs reviewed and stable. Patient is neurovascularly intact.  On physical exam, patient does have tenderness to the proximal aspect of the left fourth finger with overlying ecchymosis and soft tissue swelling.  Patient has good extension but with limited flexion.  She can somewhat make a fist but cannot touch the finger all the way to the palm secondary to pain.  Consider fracture versus dislocation.  X-rays ordered at triage.  X-ray reviewed.  There is an oblique fracture of the proximal left fourth phalanx.  There is some minimal angulation.  Discussed results with patient.  Will plan to consult hand.  Discussed patient with Dr. Janee Mornhompson (Hand).  Agrees with plan to splint.  We will plan to see patient in his office.  We will arrange an appointment with her tomorrow.  Updated patient on plan.  Splint applied.  Patient instructed to follow-up with hand as directed. Patient had ample opportunity for questions and discussion. All patient's questions were answered with full understanding. Strict return precautions discussed. Patient expresses understanding and agreement to plan.    Final Clinical Impressions(s) / ED Diagnoses   Final diagnoses:  Closed displaced fracture of proximal phalanx of left ring finger, initial encounter    ED Discharge Orders    None       Rosana HoesLayden, Marvis Saefong A, PA-C 11/16/17 0119    Molpus, Jonny RuizJohn, MD 11/16/17 [id number]

## 2017-11-15 NOTE — Discharge Instructions (Addendum)
You can take tylenol as needed for pain.   Wear the splint for support and stabilization.   As we discussed, the hand doctor office will call you tomorrow to arrange an appointment. If you do not hear from them, call their office to arrnage an appointment.  Return to the Emergency Department fro any worsening pain, numbness/weakness of the hadn, fever, redness or swelling or any other worsening or concerning symptoms.

## 2018-02-17 ENCOUNTER — Encounter: Payer: Self-pay | Admitting: Neurology

## 2018-02-17 ENCOUNTER — Institutional Professional Consult (permissible substitution): Payer: Medicare Other | Admitting: Neurology

## 2018-02-17 ENCOUNTER — Telehealth: Payer: Self-pay

## 2018-02-17 NOTE — Telephone Encounter (Signed)
Pt called she missed appt today, she fell asleep on the couch and woke up at 3:45  FYI

## 2018-02-17 NOTE — Telephone Encounter (Signed)
Patient no show for new patient appt. 

## 2018-03-16 ENCOUNTER — Encounter: Payer: Self-pay | Admitting: Neurology

## 2018-04-26 ENCOUNTER — Institutional Professional Consult (permissible substitution): Payer: Medicare Other | Admitting: Neurology

## 2018-05-26 ENCOUNTER — Encounter: Payer: Self-pay | Admitting: Neurology

## 2018-05-26 ENCOUNTER — Ambulatory Visit: Payer: Medicare Other | Admitting: Neurology

## 2018-05-26 ENCOUNTER — Telehealth: Payer: Self-pay | Admitting: Neurology

## 2018-05-26 VITALS — BP 112/63 | HR 79 | Ht 62.0 in | Wt 140.6 lb

## 2018-05-26 DIAGNOSIS — R296 Repeated falls: Secondary | ICD-10-CM

## 2018-05-26 DIAGNOSIS — Z8673 Personal history of transient ischemic attack (TIA), and cerebral infarction without residual deficits: Secondary | ICD-10-CM | POA: Diagnosis not present

## 2018-05-26 DIAGNOSIS — G20 Parkinson's disease: Secondary | ICD-10-CM

## 2018-05-26 MED ORDER — CARBIDOPA-LEVODOPA 25-100 MG PO TABS: 0.5000 | ORAL_TABLET | Freq: Three times a day (TID) | ORAL | 2 refills | Status: DC

## 2018-05-26 NOTE — Patient Instructions (Signed)
I had a long discussion with the patient with regards to her hand tremors being likely are concerning in nature. I recommend the check MRI scan of the brain to look for any underlying structural lesions or new strokes. Check CMP, CBC, TSH and B12. Trial of Sinemet 25/100 half a tablet twice daily for 1 week to increase to 3 times daily and then subsequently thereafter as tolerated. I also advised the patient to use her cane at all times and we discussed fall and safety prevention precautions. Referred to physical therapy for gait and balance training. Continue aspirin for stroke prevention with strict control of hypertension with blood pressure goal below 130/90 and lipids with LDL cholesterol goal below 70 mg percent. She will return for follow-up in 2 months or call earlier if necessary.   Fall Prevention in the Home Falls can cause injuries. They can happen to people of all ages. There are many things you can do to make your home safe and to help prevent falls. What can I do on the outside of my home?  Regularly fix the edges of walkways and driveways and fix any cracks.  Remove anything that might make you trip as you walk through a door, such as a raised step or threshold.  Trim any bushes or trees on the path to your home.  Use bright outdoor lighting.  Clear any walking paths of anything that might make someone trip, such as rocks or tools.  Regularly check to see if handrails are loose or broken. Make sure that both sides of any steps have handrails.  Any raised decks and porches should have guardrails on the edges.  Have any leaves, snow, or ice cleared regularly.  Use sand or salt on walking paths during winter.  Clean up any spills in your garage right away. This includes oil or grease spills. What can I do in the bathroom?  Use night lights.  Install grab bars by the toilet and in the tub and shower. Do not use towel bars as grab bars.  Use non-skid mats or decals in the  tub or shower.  If you need to sit down in the shower, use a plastic, non-slip stool.  Keep the floor dry. Clean up any water that spills on the floor as soon as it happens.  Remove soap buildup in the tub or shower regularly.  Attach bath mats securely with double-sided non-slip rug tape.  Do not have throw rugs and other things on the floor that can make you trip. What can I do in the bedroom?  Use night lights.  Make sure that you have a light by your bed that is easy to reach.  Do not use any sheets or blankets that are too big for your bed. They should not hang down onto the floor.  Have a firm chair that has side arms. You can use this for support while you get dressed.  Do not have throw rugs and other things on the floor that can make you trip. What can I do in the kitchen?  Clean up any spills right away.  Avoid walking on wet floors.  Keep items that you use a lot in easy-to-reach places.  If you need to reach something above you, use a strong step stool that has a grab bar.  Keep electrical cords out of the way.  Do not use floor polish or wax that makes floors slippery. If you must use wax, use non-skid floor wax.  Do not have throw rugs and other things on the floor that can make you trip. What can I do with my stairs?  Do not leave any items on the stairs.  Make sure that there are handrails on both sides of the stairs and use them. Fix handrails that are broken or loose. Make sure that handrails are as long as the stairways.  Check any carpeting to make sure that it is firmly attached to the stairs. Fix any carpet that is loose or worn.  Avoid having throw rugs at the top or bottom of the stairs. If you do have throw rugs, attach them to the floor with carpet tape.  Make sure that you have a light switch at the top of the stairs and the bottom of the stairs. If you do not have them, ask someone to add them for you. What else can I do to help prevent  falls?  Wear shoes that: ? Do not have high heels. ? Have rubber bottoms. ? Are comfortable and fit you well. ? Are closed at the toe. Do not wear sandals.  If you use a stepladder: ? Make sure that it is fully opened. Do not climb a closed stepladder. ? Make sure that both sides of the stepladder are locked into place. ? Ask someone to hold it for you, if possible.  Clearly mark and make sure that you can see: ? Any grab bars or handrails. ? First and last steps. ? Where the edge of each step is.  Use tools that help you move around (mobility aids) if they are needed. These include: ? Canes. ? Walkers. ? Scooters. ? Crutches.  Turn on the lights when you go into a dark area. Replace any light bulbs as soon as they burn out.  Set up your furniture so you have a clear path. Avoid moving your furniture around.  If any of your floors are uneven, fix them.  If there are any pets around you, be aware of where they are.  Review your medicines with your doctor. Some medicines can make you feel dizzy. This can increase your chance of falling. Ask your doctor what other things that you can do to help prevent falls. This information is not intended to replace advice given to you by your health care provider. Make sure you discuss any questions you have with your health care provider. Document Released: 08/22/2009 Document Revised: 04/02/2016 Document Reviewed: 11/30/2014 Elsevier Interactive Patient Education  2018 [employer]  Parkinson Disease Parkinson disease is a long-term (chronic) condition. It gets worse over time (is progressive). Parkinson disease limits your ability:  To control how your body moves.  To move your body normally.  This condition affects each person differently. The condition can range from mild to very bad. This condition tends to progress slowly over many years. Follow these instructions at home:  Take over-the-counter and prescription medicines  only as told by your doctor.  Put grab bars and railings in your home. These help to prevent falls.  Follow instructions from your doctor about what you can or cannot eat or drink.  Go back to your normal activities as told by your doctor. Ask your doctor what activities are safe for you.  Exercise as told by your doctor or physical therapist.  Keep all follow-up visits as told by your doctor. This is important. These include any visits with a speech or occupational therapist.  Think about joining a support group for people who  have Parkinson disease. Contact a doctor if:  Medicines do not help your symptoms.  You feel off-balance.  You fall at home.  You need more help at home.  You have: ? Trouble swallowing. ? A very hard time pooping (constipation). ? A lot of side effects from your medicines.  You see or hear things that are not real (hallucinate).  You feel: ? Confused. ? Anxious. ? Sad (depressed). Get help right away if:  You were hurt in a fall.  You cannot swallow without choking.  You have chest pain.  You have trouble breathing.  You do not feel safe at home. This information is not intended to replace advice given to you by your health care provider. Make sure you discuss any questions you have with your health care provider. Document Released: 01/18/2012 Document Revised: 04/02/2016 Document Reviewed: 08/16/2015 Elsevier Interactive Patient Education  [employer].

## 2018-05-26 NOTE — Telephone Encounter (Signed)
[employer] Medicare order sent to [facility]. No auth they will reach out to the pt to schedule.  °

## 2018-05-26 NOTE — Progress Notes (Signed)
[facility] [address] [place] [id number] [phone]       OFFICE CONSULT NOTE  Ms. Dana CossCarol Stallings Date of Birth:  04/02/1946 Medical Record Number:  [id number]   Referring MD:  Juluis RainierElizabeth Barnes  Reason for Referral:  Hand tremor HPI: [name] is a pleasant 72 year old Caucasian lady who is referred to see me today for hand tremor which is had for 4 years. She states that the tremor is been progressive increasing particularly in the last year or so. Is present mostly at rest but she notices it with activities like holding her hands up holding a cup of coffee. She also noted at times that she has been dropping objects with her right hand. She does notice slight tremor in the left hand as well but is not as bothersome. The patient has not noticed any changes in her voice or excessive drooling of saliva. She does admit that she is getting over all slower than needs more time to wear her clothes. She is also walking with a stooped posture and states that she's been falling a lot recently. She is a to major falls and fractured her finger as well as her hip. She denies tripping but states she falls without warning and cannot stop herself. She did not lose consciousness. She does have a remote history of a right pontine infarct in 2012 and had mild residual left-sided weakness that she still has that she had learned to compensate well for that. She uses a cane to ambulate but states this is mostly due to her hip surgery which did not produce anticipated benefit.She does have family history of tremor in her father but denies a definite history of Parkinson's disease.  ROS:   14 system review of systems is positive for  tremor, more marked, incontinence, diarrhea, aching muscles, depression, not enough sleep, decreased energy, change in appetite, disinterest in activities, insomnia and all other systems negative  PMH:  Past Medical History:  Diagnosis Date  . Anemia    none since hysterectomy  . Arthritis    [facility] 11'15(had a fall)-"remains tender"  . Depression   . Heart murmur    history of  . Hyperlipidemia   . Hypertension   . Stroke [facility](HCC)    4-5 yrs ago(left sided weakness) -no residual  . Transfusion history    age 72- "hysterectomy"    Social History:  Social History   Socioeconomic History  . Marital status: Widowed    Spouse name: Not on file  . Number of children: Not on file  . Years of education: Not on file  . Highest education level: Not on file  Occupational History  . Not on file  Social Needs  . Financial resource strain: Not on file  . Food insecurity:    Worry: Not on file    Inability: Not on file  . Transportation needs:    Medical: Not on file    Non-medical: Not on file  Tobacco Use  . Smoking status: Never Smoker  . Smokeless tobacco: Never Used  Substance and Sexual Activity  . Alcohol use: Yes    Comment: beer  . Drug use: No  . Sexual activity: Not on file  Lifestyle  . Physical activity:    Days per week: Not on file    Minutes per session: Not on file  . Stress: Not on file  Relationships  . Social connections:    Talks on phone: Not on file  Gets together: Not on file    Attends religious service: Not on file    Active member of club or organization: Not on file    Attends meetings of clubs or organizations: Not on file    Relationship status: Not on file  . Intimate partner violence:    Fear of current or ex partner: Not on file    Emotionally abused: Not on file    Physically abused: Not on file    Forced sexual activity: Not on file  Other Topics Concern  . Not on file  Social History Narrative  . Not on file    Medications:   Current Outpatient Medications on File Prior to Visit  Medication Sig Dispense Refill  . acetaminophen (TYLENOL) 500 MG tablet Take 500 mg by mouth every 4 (four) hours as needed for moderate pain. 2 daily    . alendronate (FOSAMAX) 70 MG tablet     .  amLODipine (NORVASC) 5 MG tablet Take 5 mg by mouth 2 (two) times daily.    . ARIPiprazole (ABILIFY) 5 MG tablet     . aspirin EC 325 MG tablet Take 1 tablet (325 mg total) by mouth daily. 30 tablet 0  . atorvastatin (LIPITOR) 40 MG tablet     . buPROPion (WELLBUTRIN XL) 150 MG 24 hr tablet 300 mg.     . CALCIUM-VITAMIN D PO Take 1 tablet by mouth daily.     Marland Kitchen gabapentin (NEURONTIN) 300 MG capsule Take 300 mg by mouth 3 (three) times daily.    Marland Kitchen lovastatin (MEVACOR) 40 MG tablet Take 40 mg by mouth daily.    . Melatonin 5 MG TABS Take by mouth.    . metoprolol tartrate (LOPRESSOR) 25 MG tablet     . Multiple Vitamin (MULTIVITAMIN) tablet Take 1 tablet by mouth daily.    . multivitamin-lutein (OCUVITE-LUTEIN) CAPS Take 1 capsule by mouth daily.    . sertraline (ZOLOFT) 100 MG tablet     . tiZANidine (ZANAFLEX) 2 MG tablet Take by mouth every 6 (six) hours as needed for muscle spasms.    . traZODone (DESYREL) 50 MG tablet Take 100 mg by mouth.      No current facility-administered medications on file prior to visit.     Allergies:   Allergies  Allergen Reactions  . Percodan [Oxycodone-Aspirin] Nausea Only  . Oxycodone Nausea Only    Physical Exam General: Frail elderly Caucasian lady seated, in no evident distress Head: head normocephalic and atraumatic.   Neck: supple with no carotid or supraclavicular bruits Cardiovascular: regular rate and rhythm, no murmurs Musculoskeletal: no deformity Skin:  no rash/petichiae Vascular:  Normal pulses all extremities  Neurologic Exam Mental Status: Awake and fully alert. Oriented to place and time. Recent and remote memory intact. Attention span, concentration and fund of knowledge appropriate. Mood and affect appropriate. Diminished recall 2/3. Animal naming 10 only. Glabellar tap is positive. Palmomental reflex is positive. No significant drooling or hypophonia noted Cranial Nerves: Fundoscopic exam reveals sharp disc margins. Pupils equal,  briskly reactive to light. Extraocular movements full without nystagmus. Visual fields full to confrontation. Hearing diminished slightly bilaterally. Facial sensation intact. Face, tongue, palate moves normally and symmetrically.  Motor: Mild weakness of left grip and intrinsic hand muscles. Mild weakness of left ankle dorsiflexors and hip flexors. Resting at times pill-rolling tremor of the right upper extremity. Mild intermittent action tremor of the left upper extremity as well as right upper extremity. Not much cogwheel rigidity. Mild bradykinesia.  Rapid alternating movements are diminished right more than left. Sensory.: intact to touch , pinprick , position and vibratory sensation.  Coordination: Rapid alternating movements normal in all extremities. Finger-to-nose and heel-to-shin performed accurately bilaterally. Gait and Station: Arises from chair with slight difficulty. Stance is normal. Gait demonstrates a stooped posture. . Slight dragging of the left foot. Poor postural response to threat and would fall if not caught Reflexes: 1+ and symmetric. Toes downgoing.       ASSESSMENT: 72 year old Caucasian lady with 4 year history of progressive upper extremity tremor and gait and balance difficulties and frequent falls likely due to Parkinson's disease. Remote history of right pontine lacunar infarct due to small vessel disease with mild residual left hemiparesis.     PLAN: I had a long discussion with the patient with regards to her hand tremors being likely are concerning in nature. I recommend the check MRI scan of the brain to look for any underlying structural lesions or new strokes. Check CMP, CBC, TSH and B12. Trial of Sinemet 25/100 half a tablet twice daily for 1 week to increase to 3 times daily and then subsequently thereafter as tolerated. I also advised the patient to use her cane at all times and we discussed fall and safety prevention precautions. Referred to physical therapy  for gait and balance training. Continue aspirin for stroke prevention with strict control of hypertension with blood pressure goal below 130/90 and lipids with LDL cholesterol goal below 70 mg percent. She will return for follow-up in 2 months or call earlier if necessary. Greater than 50% time during this 45 minute consultation visit was spent on counseling and coordination of care about her hand tremor discussion about Parkinson disease, remote stroke and answering questions Delia Heady, MD  [facility] [address] [place], [place] [address]  Phone [phone] Fax [phone] Note: This document was prepared with digital dictation and possible smart phrase technology. Any transcriptional errors that result from this process are unintentional.

## 2018-05-27 LAB — COMPREHENSIVE METABOLIC PANEL
ALT: 22 IU/L (ref 0–32)
AST: 25 IU/L (ref 0–40)
Albumin/Globulin Ratio: 1.6 (ref 1.2–2.2)
Albumin: 4.4 g/dL (ref 3.5–4.8)
Alkaline Phosphatase: 111 IU/L (ref 39–117)
BUN/Creatinine Ratio: 24 (ref 12–28)
BUN: 21 mg/dL (ref 8–27)
Bilirubin Total: 0.3 mg/dL (ref 0.0–1.2)
CO2: 21 mmol/L (ref 20–29)
Calcium: 10.1 mg/dL (ref 8.7–10.3)
Chloride: 101 mmol/L (ref 96–106)
Creatinine, Ser: 0.88 mg/dL (ref 0.57–1.00)
GFR calc Af Amer: 75 mL/min/{1.73_m2} (ref >59)
GFR calc non Af Amer: 65 mL/min/{1.73_m2} (ref >59)
Globulin, Total: 2.8 g/dL (ref 1.5–4.5)
Glucose: 94 mg/dL (ref 65–99)
Potassium: 4.5 mmol/L (ref 3.5–5.2)
Sodium: 140 mmol/L (ref 134–144)
Total Protein: 7.2 g/dL (ref 6.0–8.5)

## 2018-05-27 LAB — CBC
Hematocrit: 35.9 % (ref 34.0–46.6)
Hemoglobin: 11.9 g/dL (ref 11.1–15.9)
MCH: 29.5 pg (ref 26.6–33.0)
MCHC: 33.1 g/dL (ref 31.5–35.7)
MCV: 89 fL (ref 79–97)
Platelets: 396 10*3/uL (ref 150–450)
RBC: 4.03 x10E6/uL (ref 3.77–5.28)
RDW: 12.5 % (ref 12.3–15.4)
WBC: 5.8 10*3/uL (ref 3.4–10.8)

## 2018-05-27 LAB — VITAMIN B12: Vitamin B-12: 652 pg/mL (ref 232–1245)

## 2018-05-27 LAB — TSH: TSH: 1.16 u[IU]/mL (ref 0.450–4.500)

## 2018-06-01 ENCOUNTER — Telehealth: Payer: Self-pay | Admitting: Neurology

## 2018-06-01 NOTE — Telephone Encounter (Signed)
-----   Message from Micki RileyPramod S Sethi, MD sent at 05/31/2018  5:42 PM EDT ----- Joneen RoachKindly inform the patient that all blood work including comprehensive metabolic panel, CBC, thyroid hormone and vitamin B12 are normal

## 2018-06-01 NOTE — Telephone Encounter (Signed)
Called to inform the patient of the normal lab work results. No answer. Unable to LVM as there was voicemail box was full.   If patient calls back please inform her that the lab work was normal. No concerns.

## 2018-06-02 ENCOUNTER — Telehealth: Payer: Self-pay

## 2018-06-02 ENCOUNTER — Ambulatory Visit
Admission: RE | Admit: 2018-06-02 | Discharge: 2018-06-02 | Disposition: A | Discharge status: Home / Self Care | Payer: Medicare Other | Source: Ambulatory Visit | Attending: Neurology | Admitting: Neurology

## 2018-06-02 DIAGNOSIS — G20 Parkinson's disease: Secondary | ICD-10-CM | POA: Diagnosis not present

## 2018-06-02 MED ORDER — GADOBENATE DIMEGLUMINE 529 MG/ML IV SOLN
13.0000 mL | Freq: Once | INTRAVENOUS | Status: AC | PRN
  Administered 2018-06-02: 13 mL via INTRAVENOUS

## 2018-06-02 NOTE — Telephone Encounter (Signed)
-----   Message from Pramod S Sethi, MD sent at 05/31/2018  5:42 PM EDT ----- Kindly inform the patient that all blood work including comprehensive metabolic panel, CBC, thyroid hormone and vitamin B12 are normal 

## 2018-06-02 NOTE — Telephone Encounter (Signed)
Notes recorded by Hildred AlaminMurrell, Katrina Y, RN on 06/02/2018 at 10:38 AM EDT RN call patient that her blood work for cmp, cbc, and tsh,and b12 are normal. PT verbalized understanding. ------

## 2018-06-06 ENCOUNTER — Telehealth: Payer: Self-pay

## 2018-06-06 NOTE — Telephone Encounter (Signed)
Notes recorded by Hildred AlaminMurrell, Katrina Y, RN on 06/06/2018 at 9:32 AM EDT Unable to reach patient. Tried calling phone states memory is full. ------

## 2018-06-06 NOTE — Telephone Encounter (Signed)
-----   Message from Micki RileyPramod S Sethi, MD sent at 06/03/2018  2:32 PM EDT ----- Joneen RoachKindly inform patient that MRI shows no new or worrisome findings.Old infarct in brainstem and mild shrinkage of brain and hardening of blood vessels of brain which are chronic findings.

## 2018-06-09 NOTE — Telephone Encounter (Signed)
Notes recorded by Hildred AlaminMurrell, Odus Clasby Y, RN on 06/06/2018 at 4:06 PM EDT Rn tried to call again, phone just rang no vm set up, will try another day. ------

## 2018-07-25 DIAGNOSIS — F329 Major depressive disorder, single episode, unspecified: Secondary | ICD-10-CM | POA: Insufficient documentation

## 2018-07-25 DIAGNOSIS — F320 Major depressive disorder, single episode, mild: Secondary | ICD-10-CM

## 2018-07-25 DIAGNOSIS — F411 Generalized anxiety disorder: Secondary | ICD-10-CM

## 2018-08-02 ENCOUNTER — Other Ambulatory Visit: Payer: Self-pay | Admitting: Neurology

## 2018-08-02 ENCOUNTER — Encounter: Payer: Self-pay | Admitting: Neurology

## 2018-08-02 ENCOUNTER — Ambulatory Visit: Payer: Medicare Other | Admitting: Neurology

## 2018-08-02 VITALS — BP 112/63 | HR 70 | Ht 62.0 in | Wt 143.2 lb

## 2018-08-02 DIAGNOSIS — R251 Tremor, unspecified: Secondary | ICD-10-CM | POA: Diagnosis not present

## 2018-08-02 MED ORDER — CARBIDOPA-LEVODOPA 25-100 MG PO TABS: 1.0000 | ORAL_TABLET | Freq: Three times a day (TID) | ORAL | 2 refills | Status: DC

## 2018-08-02 NOTE — Patient Instructions (Signed)
I had a long discussion with the patient with regarding her hand tremors which appear parkinsonian nature and appear to have shown some improvement to low-dose Sinemet.  I recommend she increase the dose of Sinemet to 25/100 mg 1 tablet 3 times daily gradually over the next few weeks.  She will stay on aspirin for stroke prevention and maintain strict control of hypertension with blood pressure goal below 1 30/90 lipids with LDL cholesterol goal below 7 0 mg percent and diabetes with hemoglobin A1c goal below 6.5%.  She was encouraged to use a cane at all times and we also talked about fall and safety precautions.  She will return for follow-up in the future with my nurse practitioner Shanda BumpsJessica in 3 months or call earlier if necessary.

## 2018-08-02 NOTE — Progress Notes (Signed)
[facility] [address] [place]. [place] [id number] [phone]       OFFICE CONSULT NOTE  Ms. Dana CossCarol [name] Date of Birth:  08/25/1946 Medical Record Number:  [id number]   Referring MD:  Juluis RainierElizabeth [name]  Reason for Referral:  Hand tremor HPI: Initial visit 05/26/2018 ;[name] is a pleasant 72 year old Caucasian lady who is referred to see me today for hand tremor which is had for 4 years. She states that the tremor is been progressive increasing particularly in the last year or so. Is present mostly at rest but she notices it with activities like holding her hands up holding a cup of coffee. She also noted at times that she has been dropping objects with her right hand. She does notice slight tremor in the left hand as well but is not as bothersome. The patient has not noticed any changes in her voice or excessive drooling of saliva. She does admit that she is getting over all slower than needs more time to wear her clothes. She is also walking with a stooped posture and states that she's been falling a lot recently. She is a to major falls and fractured her finger as well as her hip. She denies tripping but states she falls without warning and cannot stop herself. She did not lose consciousness. She does have a remote history of a right pontine infarct in 2012 and had mild residual left-sided weakness that she still has that she had learned to compensate well for that. She uses a cane to ambulate but states this is mostly due to her hip surgery which did not produce anticipated benefit.She does have family history of tremor in her father but denies a definite history of Parkinson's disease. Update 08/02/2018 : She returns for follow-up after last visit 2 months ago.  She states she has noticed some improvement in her tremors since starting Sinemet.  She is yet taking only 25/100 mg half a tablet 3 times daily.  She is tolerating it well without nausea, diarrhea, dizziness,  sleepiness, hallucinations or delusions.  She still uses a cane to walk and states she has had no falls or injuries.  She did undergo MRI scan of the brain on 06/02/2018 which I personally reviewed which shows remote age right pontine infarct and age-appropriate changes of chronic microvascular ischemia and generalized cerebral atrophy.  Lab work done on 05/26/2018 shows normal vitamin B12, thyroid hormone, CBC and comprehensive metabolic panel.  She denies any drooling of saliva bradykinesia.  She has no new complaints. ROS:   14 system review of systems is positive for  tremor, insomnia, ringing in the ears, leg swelling, heat intolerance, diarrhea, incontinence of bladder, depression, anemia and all other systems negative  PMH:  Past Medical History:  Diagnosis Date  . Anemia    none since hysterectomy  . Arthritis    [facility] 11'15(had a fall)-"remains tender"  . Depression   . Heart murmur    history of  . Hyperlipidemia   . Hypertension   . Stroke [facility](HCC)    4-5 yrs ago(left sided weakness) -no residual  . Transfusion history    age 72- "hysterectomy"    Social History:  Social History   Socioeconomic History  . Marital status: Widowed    Spouse name: Not on file  . Number of children: Not on file  . Years of education: Not on file  . Highest education level: Not on file  Occupational History  . Not on file  Social Needs  . Financial resource strain: Not on file  . Food insecurity:    Worry: Not on file    Inability: Not on file  . Transportation needs:    Medical: Not on file    Non-medical: Not on file  Tobacco Use  . Smoking status: Never Smoker  . Smokeless tobacco: Never Used  Substance and Sexual Activity  . Alcohol use: Yes    Comment: beer  . Drug use: No  . Sexual activity: Not on file  Lifestyle  . Physical activity:    Days per week: Not on file    Minutes per session: Not on file  . Stress: Not on file  Relationships  . Social connections:    Talks  on phone: Not on file    Gets together: Not on file    Attends religious service: Not on file    Active member of club or organization: Not on file    Attends meetings of clubs or organizations: Not on file    Relationship status: Not on file  . Intimate partner violence:    Fear of current or ex partner: Not on file    Emotionally abused: Not on file    Physically abused: Not on file    Forced sexual activity: Not on file  Other Topics Concern  . Not on file  Social History Narrative  . Not on file    Medications:   Current Outpatient Medications on File Prior to Visit  Medication Sig Dispense Refill  . acetaminophen (TYLENOL) 500 MG tablet Take 500 mg by mouth every 4 (four) hours as needed for moderate pain. 2 daily    . alendronate (FOSAMAX) 70 MG tablet     . amLODipine (NORVASC) 5 MG tablet Take 5 mg by mouth 2 (two) times daily.    . ARIPiprazole (ABILIFY) 5 MG tablet     . aspirin EC 325 MG tablet Take 1 tablet (325 mg total) by mouth daily. 30 tablet 0  . atorvastatin (LIPITOR) 40 MG tablet     . buPROPion (WELLBUTRIN XL) 150 MG 24 hr tablet 300 mg.     . CALCIUM-VITAMIN D PO Take 1 tablet by mouth daily.     Marland Kitchen gabapentin (NEURONTIN) 300 MG capsule Take 300 mg by mouth 3 (three) times daily.    Marland Kitchen lovastatin (MEVACOR) 40 MG tablet Take 40 mg by mouth daily.    . Melatonin 5 MG TABS Take by mouth.    . metoprolol tartrate (LOPRESSOR) 25 MG tablet     . Multiple Vitamin (MULTIVITAMIN) tablet Take 1 tablet by mouth daily.    . multivitamin-lutein (OCUVITE-LUTEIN) CAPS Take 1 capsule by mouth daily.    . sertraline (ZOLOFT) 100 MG tablet     . terbinafine (LAMISIL) 250 MG tablet Take 250 mg by mouth daily.    Marland Kitchen tiZANidine (ZANAFLEX) 2 MG tablet Take by mouth every 6 (six) hours as needed for muscle spasms.    . traZODone (DESYREL) 50 MG tablet Take 100 mg by mouth.      No current facility-administered medications on file prior to visit.     Allergies:   Allergies    Allergen Reactions  . Percodan [Oxycodone-Aspirin] Nausea Only  . Oxycodone Nausea Only    Physical Exam General: Frail elderly Caucasian lady seated, in no evident distress Head: head normocephalic and atraumatic.   Neck: supple with no carotid or supraclavicular bruits Cardiovascular: regular rate and rhythm, no murmurs Musculoskeletal: no  deformity Skin:  no rash/petichiae Vascular:  Normal pulses all extremities  Neurologic Exam Mental Status: Awake and fully alert. Oriented to place and time. Recent and remote memory intact. Attention span, concentration and fund of knowledge appropriate. Mood and affect appropriate. Diminished recall 2/3. Animal naming 12 only. Glabellar tap is positive. Palmomental reflex is positive. No significant drooling or hypophonia noted Cranial Nerves: Fundoscopic exam not done. Pupils equal, briskly reactive to light. Extraocular movements full without nystagmus. Visual fields full to confrontation. Hearing diminished slightly bilaterally. Facial sensation intact. Face, tongue, palate moves normally and symmetrically.  Motor: Mild weakness of left grip and intrinsic hand muscles. Mild weakness of left ankle dorsiflexors and hip flexors. Resting at times pill-rolling tremor of the right upper extremity. Mild intermittent action tremor of the left upper extremity as well as right upper extremity. Not much cogwheel rigidity. Mild bradykinesia. Rapid alternating movements are diminished right more than left.  Unable to get up from the chair with arms folded across the chest Sensory.: intact to touch , pinprick , position and vibratory sensation.  Coordination: Rapid alternating movements normal in all extremities. Finger-to-nose and heel-to-shin performed accurately bilaterally. Gait and Station: Arises from chair with slight difficulty. Stance is normal. Gait demonstrates a stooped posture. . Slight dragging of the left foot. Poor postural response to threat and  would fall if not caught Reflexes: 1+ and symmetric. Toes downgoing.       ASSESSMENT: 72 year old Caucasian lady with 4 year history of progressive upper extremity tremor and gait and balance difficulties and frequent falls likely due to Parkinson's disease. Remote history of right pontine lacunar infarct due to small vessel disease with mild residual left hemiparesis.     PLAN: I had a long discussion with the patient with regarding her hand tremors which appear parkinsonian nature and appear to have shown some improvement to low-dose Sinemet.  I recommend she increase the dose of Sinemet to 25/100 mg 1 tablet 3 times daily gradually over the next few weeks.  She will stay on aspirin for stroke prevention and maintain strict control of hypertension with blood pressure goal below 1 30/90 lipids with LDL cholesterol goal below 7 0 mg percent and diabetes with hemoglobin A1c goal below 6.5%.  She was encouraged to use a cane at all times and we also talked about fall and safety precautions.  She will return for follow-up in the future with my nurse practitioner Shanda Bumps in 3 months or call earlier if necessary. Greater than 50% time during this 25 minute   visit was spent on counseling and coordination of care about her hand tremor discussion about Parkinson disease, remote stroke and answering questions Delia Heady, MD  [facility] [address] [place], [place] [address]  Phone [phone] Fax [phone] Note: This document was prepared with digital dictation and possible smart phrase technology. Any transcriptional errors that result from this process are unintentional.

## 2018-08-15 ENCOUNTER — Ambulatory Visit: Payer: Medicare Other | Admitting: Physician Assistant

## 2018-09-07 ENCOUNTER — Encounter: Payer: Self-pay | Admitting: Emergency Medicine

## 2018-09-07 DIAGNOSIS — G4700 Insomnia, unspecified: Secondary | ICD-10-CM | POA: Insufficient documentation

## 2018-09-22 ENCOUNTER — Ambulatory Visit: Payer: Medicare Other | Admitting: Physician Assistant

## 2018-09-22 ENCOUNTER — Encounter: Payer: Self-pay | Admitting: Physician Assistant

## 2018-09-22 DIAGNOSIS — F331 Major depressive disorder, recurrent, moderate: Secondary | ICD-10-CM | POA: Diagnosis not present

## 2018-09-22 DIAGNOSIS — G4700 Insomnia, unspecified: Secondary | ICD-10-CM

## 2018-09-22 MED ORDER — ARIPIPRAZOLE 2 MG PO TABS: 2.0000 mg | ORAL_TABLET | Freq: Every day | ORAL | 0 refills | Status: DC

## 2018-09-22 NOTE — Progress Notes (Signed)
[facility]  Patient ID: Dana [name],  MRN: [id number]  PCP: Juluis Rainier, MD  Date of Evaluation: 09/22/2018 Time spent:15 minutes  Chief Complaint:  Chief Complaint    Follow-up      HISTORY/CURRENT STATUS: HPI "I don't have Parkinsons!"  She has been through work-up for that since I saw her the last time.  Her neurologist has put her on Sinemet, which has helped the tremor some.  [place] be [place] to [place]next [place] to visit family for Christmas.  Looking forward to that!  States she has better days than not but still has times where she does not want to do anything, has low energy and motivation, and finds herself being home alone.  "Of course I do not have much else to do anyway."  She has felt that when we increase the Abilify several months back, it did help with energy and motivation and made her feel happier and with more hope.  She sleeps well most of the time.  She does not take the trazodone every night.  The melatonin is usually enough.  Sometimes she stays awake in bed reading but that is by choice.  Individual Medical History/ Review of Systems: Changes? :No   Allergies: Percodan [oxycodone-aspirin] and Oxycodone  Current Medications:  Current Outpatient Medications:  .  acetaminophen (TYLENOL) 500 MG tablet, Take 500 mg by mouth every 4 (four) hours as needed for moderate pain. 2 daily, Disp: , Rfl:  .  alendronate (FOSAMAX) 70 MG tablet, , Disp: , Rfl:  .  amLODipine (NORVASC) 5 MG tablet, Take 5 mg by mouth 2 (two) times daily., Disp: , Rfl:  .  ARIPiprazole (ABILIFY) 5 MG tablet, , Disp: , Rfl:  .  aspirin EC 325 MG tablet, Take 1 tablet (325 mg total) by mouth daily., Disp: 30 tablet, Rfl: 0 .  atorvastatin (LIPITOR) 40 MG tablet, , Disp: , Rfl:  .  buPROPion (WELLBUTRIN XL) 150 MG 24 hr tablet, 450 mg. , Disp: , Rfl:  .  CALCIUM-VITAMIN D PO, Take 1 tablet by mouth daily. , Disp: , Rfl:  .  carbidopa-levodopa (SINEMET IR) 25-100 MG tablet,  Take 1 tablet by mouth 3 (three) times daily., Disp: 60 tablet, Rfl: 2 .  gabapentin (NEURONTIN) 300 MG capsule, Take 300 mg by mouth 3 (three) times daily., Disp: , Rfl:  .  lovastatin (MEVACOR) 40 MG tablet, Take 40 mg by mouth daily., Disp: , Rfl:  .  Melatonin 5 MG TABS, Take by mouth., Disp: , Rfl:  .  metoprolol tartrate (LOPRESSOR) 25 MG tablet, , Disp: , Rfl:  .  Multiple Vitamin (MULTIVITAMIN) tablet, Take 1 tablet by mouth daily., Disp: , Rfl:  .  multivitamin-lutein (OCUVITE-LUTEIN) CAPS, Take 1 capsule by mouth daily., Disp: , Rfl:  .  sertraline (ZOLOFT) 100 MG tablet, , Disp: , Rfl:  .  terbinafine (LAMISIL) 250 MG tablet, Take 250 mg by mouth daily., Disp: , Rfl:  .  tiZANidine (ZANAFLEX) 2 MG tablet, Take by mouth every 6 (six) hours as needed for muscle spasms., Disp: , Rfl:  .  traZODone (DESYREL) 50 MG tablet, Take 100 mg by mouth. , Disp: , Rfl:  .  ARIPiprazole (ABILIFY) 2 MG tablet, Take 1 tablet (2 mg total) by mouth daily. Take with the 5 mg to equal 7mg  daily., Disp: 90 tablet, Rfl: 0 Medication Side Effects: none  Family Medical/ Social History: Changes? No  MENTAL HEALTH EXAM:  There were no vitals taken for this  visit.There is no height or weight on file to calculate BMI.  General Appearance: Well Groomed  Eye Contact:  Good  Speech:  Clear and Coherent  Volume:  Normal  Mood:  Euthymic  Affect:  Appropriate  Thought Process:  Goal Directed  Orientation:  Full (Time, Place, and Person)  Thought Content: Logical   Suicidal Thoughts:  No  Homicidal Thoughts:  No  Memory:  WNL  Judgement:  Good  Insight:  Good  Psychomotor Activity:  Shuffling Gait and Tremor walks with cane, has mild fine motor tremor of her hands bilaterally.  Concentration:  Concentration: Good  Recall:  Good  Fund of Knowledge: Good  Language: Good  Assets:  Desire for Improvement  ADL's:  Intact but slowed  Cognition: WNL  Prognosis:  Good    DIAGNOSES:    ICD-10-CM   1.  Major depressive disorder, recurrent episode, moderate (HCC) F33.1   2. Insomnia, unspecified type G47.00   Tremor not due to Parkinson's per patient, history of stroke, fall risk, hypertension, hyperlipidemia  Receiving Psychotherapy: No    RECOMMENDATIONS: Increase Abilify from 5 mg to a total of 7 mg.  She understands to add a 2 mg pill to what she is currently taking. Continue other medications. Return in 2 months after she returns from [place] in [place] for [place].   Melony Overlyeresa Joffrey Kerce, PA-C

## 2018-11-21 ENCOUNTER — Encounter: Payer: Self-pay | Admitting: Physician Assistant

## 2018-11-21 ENCOUNTER — Ambulatory Visit: Payer: Medicare Other | Admitting: Physician Assistant

## 2018-11-21 DIAGNOSIS — F331 Major depressive disorder, recurrent, moderate: Secondary | ICD-10-CM

## 2018-11-21 DIAGNOSIS — G4700 Insomnia, unspecified: Secondary | ICD-10-CM

## 2018-11-21 MED ORDER — SERTRALINE HCL 100 MG PO TABS: 100.0000 mg | ORAL_TABLET | Freq: Every day | ORAL | 0 refills | Status: DC

## 2018-11-21 MED ORDER — ARIPIPRAZOLE 5 MG PO TABS: 5.0000 mg | ORAL_TABLET | Freq: Every day | ORAL | 0 refills | Status: DC

## 2018-11-21 MED ORDER — TRAZODONE HCL 50 MG PO TABS: 100.0000 mg | ORAL_TABLET | Freq: Every evening | ORAL | 0 refills | Status: DC | PRN

## 2018-11-21 MED ORDER — ARIPIPRAZOLE 2 MG PO TABS: 2.0000 mg | ORAL_TABLET | Freq: Every day | ORAL | 0 refills | Status: DC

## 2018-11-21 MED ORDER — BUPROPION HCL ER (XL) 150 MG PO TB24: 450.0000 mg | ORAL_TABLET | Freq: Every day | ORAL | 0 refills | Status: DC

## 2018-11-21 NOTE — Progress Notes (Signed)
[facility]  Patient ID: Dana CossCarol Vanwyk,  MRN: [id number]  PCP: Juluis RainierBarnes, [name], MD  Date of Evaluation: 11/22/2018 Time spent:15 minutes  Chief Complaint:  Chief Complaint    Follow-up; Depression      HISTORY/CURRENT STATUS: HPI For routine med check.  She realized she hasn't been taking the extra 2 mg of Abilify as we had discussed at the last visit.  States she feels about the same.  She has low energy and motivation.  She does not enjoy doing things much except reading.  However her health problems also prevent her from being more active.  She has to use a cane to assist with walking.  She does not cry easily.  She sleeps pretty well.  Takes the trazodone sometimes but does not always need it.  She enjoyed visiting her son and his family in [place] over the Christmas holidays.  She hated to leave.  Individual Medical History/ Review of Systems: Changes? :No She had a recent physical.  And everything was normal for her.  Past medications for mental health diagnoses include: Zoloft, Prozac, Wellbutrin, trazodone, Abilify  Allergies: Percodan [oxycodone-aspirin] and Oxycodone  Current Medications:  Current Outpatient Medications:  .  acetaminophen (TYLENOL) 500 MG tablet, Take 500 mg by mouth every 4 (four) hours as needed for moderate pain. 2 daily, Disp: , Rfl:  .  alendronate (FOSAMAX) 70 MG tablet, , Disp: , Rfl:  .  amLODipine (NORVASC) 5 MG tablet, Take 5 mg by mouth 2 (two) times daily., Disp: , Rfl:  .  ARIPiprazole (ABILIFY) 5 MG tablet, Take 1 tablet (5 mg total) by mouth daily. Take with 2 mg = 7mg ., Disp: 90 tablet, Rfl: 0 .  aspirin EC 325 MG tablet, Take 1 tablet (325 mg total) by mouth daily., Disp: 30 tablet, Rfl: 0 .  atorvastatin (LIPITOR) 40 MG tablet, , Disp: , Rfl:  .  buPROPion (WELLBUTRIN XL) 150 MG 24 hr tablet, Take 3 tablets (450 mg total) by mouth daily., Disp: 270 tablet, Rfl: 0 .  CALCIUM-VITAMIN D PO, Take 1 tablet by mouth  daily. , Disp: , Rfl:  .  carbidopa-levodopa (SINEMET IR) 25-100 MG tablet, Take 1 tablet by mouth 3 (three) times daily., Disp: 60 tablet, Rfl: 2 .  gabapentin (NEURONTIN) 300 MG capsule, Take 300 mg by mouth 3 (three) times daily., Disp: , Rfl:  .  lovastatin (MEVACOR) 40 MG tablet, Take 40 mg by mouth daily., Disp: , Rfl:  .  Melatonin 5 MG TABS, Take by mouth., Disp: , Rfl:  .  metoprolol tartrate (LOPRESSOR) 25 MG tablet, , Disp: , Rfl:  .  Multiple Vitamin (MULTIVITAMIN) tablet, Take 1 tablet by mouth daily., Disp: , Rfl:  .  multivitamin-lutein (OCUVITE-LUTEIN) CAPS, Take 1 capsule by mouth daily., Disp: , Rfl:  .  sertraline (ZOLOFT) 100 MG tablet, Take 1 tablet (100 mg total) by mouth daily., Disp: 90 tablet, Rfl: 0 .  terbinafine (LAMISIL) 250 MG tablet, Take 250 mg by mouth daily., Disp: , Rfl:  .  tiZANidine (ZANAFLEX) 2 MG tablet, Take by mouth every 6 (six) hours as needed for muscle spasms., Disp: , Rfl:  .  traZODone (DESYREL) 50 MG tablet, Take 2 tablets (100 mg total) by mouth at bedtime as needed for sleep., Disp: 90 tablet, Rfl: 0 .  ARIPiprazole (ABILIFY) 2 MG tablet, Take 1 tablet (2 mg total) by mouth daily. Take with the 5 mg to equal 7mg  daily. (Patient not taking: Reported on 11/21/2018), Disp:  90 tablet, Rfl: 0 .  ARIPiprazole (ABILIFY) 2 MG tablet, Take 1 tablet (2 mg total) by mouth daily. Take with 5 mg=7mg , Disp: 90 tablet, Rfl: 0 Medication Side Effects: none  Family Medical/ Social History: Changes? No  MENTAL HEALTH EXAM:  There were no vitals taken for this visit.There is no height or weight on file to calculate BMI.  General Appearance: Casual  Eye Contact:  Good  Speech:  Clear and Coherent  Volume:  Normal  Mood:  Euthymic  Affect:  Appropriate  Thought Process:  Goal Directed  Orientation:  Full (Time, Place, and Person)  Thought Content: Logical   Suicidal Thoughts:  No  Homicidal Thoughts:  No  Memory:  WNL  Judgement:  Good  Insight:  Good   Psychomotor Activity:  Normal she uses a cane.  Is slow physically.  No tremor, tics.  Concentration:  Concentration: Good  Recall:  Good  Fund of Knowledge: Good  Language: Good  Assets:  Desire for Improvement  ADL's:  Intact  Cognition: WNL  Prognosis:  Good  Labs from 11/17/2018 hemoglobin is 5.8 glucose 111 the remainder of CMP was completely normal.  Vitamin D was 46.6 lipid panel was completely normal.  DIAGNOSES:    ICD-10-CM   1. Major depressive disorder, recurrent episode, moderate (HCC) F33.1   2. Insomnia, unspecified type G47.00     Receiving Psychotherapy: No    RECOMMENDATIONS: Increase the Abilify to a total of 7 mg daily.  She verbalizes understanding. Continue Wellbutrin XL 450 mg daily. Continue Zoloft 100 mg p.o. daily. Continue trazodone 50 mg 1-2 nightly as needed sleep. Return in 4 to 6 weeks.  Melony Overlyeresa Hurst, PA-C

## 2018-11-22 ENCOUNTER — Ambulatory Visit: Payer: Medicare Other | Admitting: Adult Health

## 2018-11-22 ENCOUNTER — Encounter: Payer: Self-pay | Admitting: Adult Health

## 2018-11-22 VITALS — BP 120/65 | HR 57 | Ht 62.0 in | Wt 137.0 lb

## 2018-11-22 DIAGNOSIS — E785 Hyperlipidemia, unspecified: Secondary | ICD-10-CM | POA: Diagnosis not present

## 2018-11-22 DIAGNOSIS — I10 Essential (primary) hypertension: Secondary | ICD-10-CM

## 2018-11-22 DIAGNOSIS — G20 Parkinson's disease: Secondary | ICD-10-CM

## 2018-11-22 DIAGNOSIS — Z8673 Personal history of transient ischemic attack (TIA), and cerebral infarction without residual deficits: Secondary | ICD-10-CM | POA: Diagnosis not present

## 2018-11-22 DIAGNOSIS — I499 Cardiac arrhythmia, unspecified: Secondary | ICD-10-CM

## 2018-11-22 MED ORDER — CARBIDOPA-LEVODOPA 25-100 MG PO TABS: 1.0000 | ORAL_TABLET | Freq: Three times a day (TID) | ORAL | 4 refills | Status: DC

## 2018-11-22 NOTE — Patient Instructions (Addendum)
Continue sinemet 1 tab three times daily for tremors  Continue aspirin 81 mg daily  and lipitor  for secondary stroke prevention  Continue to follow up with PCP regarding cholesterol and blood pressure managment   You will be called to set up 30 day heart monitor to assess for possible cause of irregular heart rhythm along with ensuring you do not have atrial fibrillation which increases your chance of recurrent stroke   Continue to monitor blood pressure at home  Maintain strict control of hypertension with blood pressure goal below 130/90, diabetes with hemoglobin A1c goal below 6.5% and cholesterol with LDL cholesterol (bad cholesterol) goal below 70 mg/dL. I also advised the patient to eat a healthy diet with plenty of whole grains, cereals, fruits and vegetables, exercise regularly and maintain ideal body weight.  Followup in the future with me in 3 months or call earlier if needed       Thank you for coming to see [facility] at [facility]. I hope we have been able to provide you high quality care today.  You may receive a patient satisfaction survey over the next few weeks. We would appreciate your feedback and comments so that we may continue to improve ourselves and the health of our patients.

## 2018-11-22 NOTE — Progress Notes (Signed)
[facility] [address] [place]. [place] [address] [phone]       OFFICE CONSULT NOTE  Dana. Dana [name] Date of Birth:  10/12/1946 Medical Record Number:  [id number]   Referring MD:  Juluis Rainier  Chief Complaint  Patient presents with  . Follow-up    3 month follow up. Alone. Rm 9. No new concerns at this time.      Reason for Referral:  Hand tremor HPI: Initial visit 05/26/2018 PS;Dana [name] is a pleasant 72 year old Caucasian lady who is referred to see me today for hand tremor which is had for 4 years. She states that the tremor is been progressive increasing particularly in the last year or so. Is present mostly at rest but she notices it with activities like holding her hands up holding a cup of coffee. She also noted at times that she has been dropping objects with her right hand. She does notice slight tremor in the left hand as well but is not as bothersome. The patient has not noticed any changes in her voice or excessive drooling of saliva. She does admit that she is getting over all slower than needs more time to wear her clothes. She is also walking with a stooped posture and states that she's been falling a lot recently. She is a to major falls and fractured her finger as well as her hip. She denies tripping but states she falls without warning and cannot stop herself. She did not lose consciousness. She does have a remote history of a right pontine infarct in 2012 and had mild residual left-sided weakness that she still has that she had learned to compensate well for that. She uses a cane to ambulate but states this is mostly due to her hip surgery which did not produce anticipated benefit.She does have family history of tremor in her father but denies a definite history of Parkinson's disease. Update 08/02/2018 PS: She returns for follow-up after last visit 2 months ago.  She states she has noticed some improvement in her tremors since starting Sinemet.   She is yet taking only 25/100 mg half a tablet 3 times daily.  She is tolerating it well without nausea, diarrhea, dizziness, sleepiness, hallucinations or delusions.  She still uses a cane to walk and states she has had no falls or injuries.  She did undergo MRI scan of the brain on 06/02/2018 which I personally reviewed which shows remote age right pontine infarct and age-appropriate changes of chronic microvascular ischemia and generalized cerebral atrophy.  Lab work done on 05/26/2018 shows normal vitamin B12, thyroid hormone, CBC and comprehensive metabolic panel.  She denies any drooling of saliva bradykinesia.  She has no new complaints.  Interval history 11/22/2018: Patient is being seen today for a follow-up visit.  Patient states improvement of her tremors since initiating Sinemet and is currently taking 1 tablet by mouth 3 times daily.  She is able to perform activities and ADLs at this time such as holding a cup of coffee or bathing.  She is tolerating Sinemet well without any reported side effects.  She continues to use a cane for ambulation and denies any recent falls.  No further concerns at this time.   ROS:   14 system review of systems is positive for tremor and walking difficulty and all other systems negative  PMH:  Past Medical History:  Diagnosis Date  . Anemia    none since hysterectomy  . Arthritis    [facility] 11'15(had  a fall)-"remains tender"  . Depression   . Heart murmur    history of  . Hyperlipidemia   . Hypertension   . Stroke [facility])    4-5 yrs ago(left sided weakness) -no residual  . Transfusion history    age 29- "hysterectomy"    Social History:  Social History   Socioeconomic History  . Marital status: Widowed    Spouse name: Not on file  . Number of children: Not on file  . Years of education: Not on file  . Highest education level: Not on file  Occupational History  . Not on file  Social Needs  . Financial resource strain: Not on file  . Food  insecurity:    Worry: Not on file    Inability: Not on file  . Transportation needs:    Medical: Not on file    Non-medical: Not on file  Tobacco Use  . Smoking status: Never Smoker  . Smokeless tobacco: Never Used  Substance and Sexual Activity  . Alcohol use: Yes    Alcohol/week: 1.0 standard drinks    Types: 1 Cans of beer per week    Comment: daily  . Drug use: Never  . Sexual activity: Not on file  Lifestyle  . Physical activity:    Days per week: Not on file    Minutes per session: Not on file  . Stress: Not on file  Relationships  . Social connections:    Talks on phone: Not on file    Gets together: Not on file    Attends religious service: Not on file    Active member of club or organization: Not on file    Attends meetings of clubs or organizations: Not on file    Relationship status: Not on file  . Intimate partner violence:    Fear of current or ex partner: Not on file    Emotionally abused: Not on file    Physically abused: Not on file    Forced sexual activity: Not on file  Other Topics Concern  . Not on file  Social History Narrative  . Not on file    Medications:   Current Outpatient Medications on File Prior to Visit  Medication Sig Dispense Refill  . acetaminophen (TYLENOL) 500 MG tablet Take 500 mg by mouth every 4 (four) hours as needed for moderate pain. 2 daily    . alendronate (FOSAMAX) 70 MG tablet     . amLODipine (NORVASC) 5 MG tablet Take 5 mg by mouth 2 (two) times daily.    . ARIPiprazole (ABILIFY) 2 MG tablet Take 1 tablet (2 mg total) by mouth daily. Take with the 5 mg to equal 7mg  daily. 90 tablet 0  . ARIPiprazole (ABILIFY) 2 MG tablet Take 1 tablet (2 mg total) by mouth daily. Take with 5 mg=7mg  90 tablet 0  . ARIPiprazole (ABILIFY) 5 MG tablet Take 1 tablet (5 mg total) by mouth daily. Take with 2 mg = 7mg . 90 tablet 0  . aspirin EC 325 MG tablet Take 1 tablet (325 mg total) by mouth daily. 30 tablet 0  . atorvastatin (LIPITOR) 40  MG tablet     . buPROPion (WELLBUTRIN XL) 150 MG 24 hr tablet Take 3 tablets (450 mg total) by mouth daily. 270 tablet 0  . CALCIUM-VITAMIN D PO Take 1 tablet by mouth daily.     . carbidopa-levodopa (SINEMET IR) 25-100 MG tablet Take 1 tablet by mouth 3 (three) times daily. 60 tablet 2  .  gabapentin (NEURONTIN) 300 MG capsule Take 300 mg by mouth 3 (three) times daily.    Marland Kitchen. lovastatin (MEVACOR) 40 MG tablet Take 40 mg by mouth daily.    . Melatonin 5 MG TABS Take by mouth.    . metoprolol tartrate (LOPRESSOR) 25 MG tablet     . Multiple Vitamin (MULTIVITAMIN) tablet Take 1 tablet by mouth daily.    . multivitamin-lutein (OCUVITE-LUTEIN) CAPS Take 1 capsule by mouth daily.    . sertraline (ZOLOFT) 100 MG tablet Take 1 tablet (100 mg total) by mouth daily. 90 tablet 0  . terbinafine (LAMISIL) 250 MG tablet Take 250 mg by mouth daily.    Marland Kitchen. tiZANidine (ZANAFLEX) 2 MG tablet Take by mouth every 6 (six) hours as needed for muscle spasms.    . traZODone (DESYREL) 50 MG tablet Take 2 tablets (100 mg total) by mouth at bedtime as needed for sleep. 90 tablet 0   No current facility-administered medications on file prior to visit.     Allergies:   Allergies  Allergen Reactions  . Percodan [Oxycodone-Aspirin] Nausea Only  . Oxycodone Nausea Only    Physical Exam General: Frail elderly Caucasian lady seated, in no evident distress Head: head normocephalic and atraumatic.   Neck: supple with no carotid or supraclavicular bruits Cardiovascular: irregular rate and rhythm -resolved prior to obtaining EKG, no murmurs Musculoskeletal: no deformity Skin:  no rash/petichiae Vascular:  Normal pulses all extremities  Neurologic Exam Mental Status: Awake and fully alert. Oriented to place and time. Recent and remote memory intact. Attention span, concentration and fund of knowledge appropriate. Mood and affect appropriate. Diminished recall 2/3. Animal naming 12 only. Glabellar tap is positive.  Palmomental reflex is positive. No significant drooling or hypophonia noted Cranial Nerves: Fundoscopic exam not done. Pupils equal, briskly reactive to light. Extraocular movements full without nystagmus. Visual fields full to confrontation. Hearing diminished slightly bilaterally. Facial sensation intact. Face, tongue, palate moves normally and symmetrically.  Motor: Mild weakness of left grip and intrinsic hand muscles. Mild weakness of left ankle dorsiflexors and hip flexors. Resting at times pill-rolling tremor of the right upper extremity. Mild intermittent action tremor of the left upper extremity as well as right upper extremity. Not much cogwheel rigidity. Mild bradykinesia. Rapid alternating movements are diminished right more than left.  Unable to get up from the chair with arms folded across the chest Sensory.: intact to touch , pinprick , position and vibratory sensation.  Coordination: Rapid alternating movements decreased RUE. . Gait and Station: Arises from chair with slight difficulty. Stance is normal. Gait demonstrates a stooped posture. . Slight dragging of the left foot with assistance of cane Reflexes: 1+ and symmetric. Toes downgoing.       ASSESSMENT: 73 year old Caucasian lady with 4 year history of progressive upper extremity tremor and gait and balance difficulties and frequent falls likely due to Parkinson's disease. Remote history of right pontine lacunar infarct due to small vessel disease with mild residual left hemiparesis.  Patient is being seen today for follow-up visit of tremors which have improved after initiation of Sinemet.     PLAN: -Continue Sinemet 1 tab 3 times daily for tremors -Continue aspirin and Lipitor for secondary stroke prevention -Finding of irregular heart rhythm during assessment -30-day cardiac event monitor ordered to rule out atrial fibrillation or other cardiac arrhythmia especially with history of stroke -Advised to continue to stay  active and continue healthy diet  Follow-up in 3 months or call earlier if needed  Greater than 50% time during this 25 minute   visit was spent on counseling and coordination of care about her hand tremor discussion about Parkinson disease, remote stroke and answering questions  George HughJessica VanSchaick, AGNP-BC  [facility] [address] [place], [place] [id number]  Phone [phone] Fax [phone] Note: This document was prepared with digital dictation and possible smart phrase technology. Any transcriptional errors that result from this process are unintentional.

## 2018-11-24 ENCOUNTER — Encounter: Payer: Self-pay | Admitting: Cardiovascular Disease

## 2018-11-24 ENCOUNTER — Ambulatory Visit: Payer: Medicare Other | Admitting: Cardiovascular Disease

## 2018-11-24 VITALS — BP 120/62 | HR 58 | Ht 62.0 in | Wt 136.0 lb

## 2018-11-24 DIAGNOSIS — R251 Tremor, unspecified: Secondary | ICD-10-CM

## 2018-11-24 DIAGNOSIS — Z8673 Personal history of transient ischemic attack (TIA), and cerebral infarction without residual deficits: Secondary | ICD-10-CM

## 2018-11-24 DIAGNOSIS — I10 Essential (primary) hypertension: Secondary | ICD-10-CM

## 2018-11-24 DIAGNOSIS — E785 Hyperlipidemia, unspecified: Secondary | ICD-10-CM

## 2018-11-24 DIAGNOSIS — I499 Cardiac arrhythmia, unspecified: Secondary | ICD-10-CM

## 2018-11-24 DIAGNOSIS — F329 Major depressive disorder, single episode, unspecified: Secondary | ICD-10-CM

## 2018-11-24 DIAGNOSIS — F32A Depression, unspecified: Secondary | ICD-10-CM

## 2018-11-24 NOTE — Progress Notes (Signed)
Cardiology Office Note    Date:  11/28/2018   ID:  Dezhane Staten, DOB 05/23/1946, MRN [id number]  PCP:  Leighton Ruff, MD  Cardiologist:  Shelva Majestic, MD   New cardiology evaluation referred by Venancio Poisson, NP of [facility] for evaluation of an irregular heartbeat.  History of Present Illness:  Dana [name] is a 73 y.o. female was recently evaluated by Venancio Poisson, NP for neurologic follow-up evaluation due to an irregular heart rhythm during her assessment, cardiology evaluation was recommended with consideration for monitoring.  She presents for initial evaluation with me.  Dana [name] has a remote history of CVA and has been followed by Dr. Leonie Man since 2012.  Has a history of hand tremor is been started on Sinemet.  RI of her brain on June 02, 2018 showed remote age right pontine infarct and age-appropriate changes of chronic microvascular ischemia with generalized cerebral atrophy.  He was found to have normal vitamin B12, thyroid hormone, CBC and comprehensive metabolic panel.  She denied any bradykinesia or any drooling of saliva.  She has a history of hyperlipidemia and has been on atorvastatin but her medical records suggest she also is possibly on lovastatin 40 mg.  He has a history of hypertension on amlodipine 5 mg twice a day metoprolol tartrate 25 mg twice a day.  Her hand tremor has improved with initiation of Sinemet 25/100 mg 3 times a day.  She noticed some heart rate irregularity.  She denies episodes of chest tightness.  She walks with a cane.  She denies presyncope or syncope.  She is unaware of any history of atrial fibrillation.  She presents for cardiology evaluation.   Past Medical History:  Diagnosis Date  . Anemia    none since hysterectomy  . Arthritis    LlTHA 11'15(had a fall)-"remains tender"  . Depression   . Heart murmur    history of  . Hyperlipidemia   . Hypertension   . Stroke [facility])    4-5 yrs ago(left sided weakness)  -no residual  . Transfusion history    age 29- "hysterectomy"    Past Surgical History:  Procedure Laterality Date  . ABDOMINAL HYSTERECTOMY    . COLONOSCOPY WITH PROPOFOL N/A 12/25/2014   Procedure: COLONOSCOPY WITH PROPOFOL;  Surgeon: Garlan Fair, MD;  Location: [facility];  Service: Endoscopy;  Laterality: N/A;  . FRACTURE SURGERY  01/22/12   fx of eye socket from fall-"retained plate"  . HAND SURGERY Left    nerve release -many yrs ago  . HIP ARTHROPLASTY Left 09/16/2014   Procedure: ARTHROPLASTY BIPOLAR HIP;  Surgeon: Renette Butters, MD;  Location: [facility];  Service: Orthopedics;  Laterality: Left;    Current Medications: Outpatient Medications Prior to Visit  Medication Sig Dispense Refill  . acetaminophen (TYLENOL) 500 MG tablet Take 500 mg by mouth every 4 (four) hours as needed for moderate pain. 2 daily    . alendronate (FOSAMAX) 70 MG tablet     . amLODipine (NORVASC) 5 MG tablet Take 5 mg by mouth 2 (two) times daily.    . ARIPiprazole (ABILIFY) 2 MG tablet Take 1 tablet (2 mg total) by mouth daily. Take with the 5 mg to equal 62m daily. 90 tablet 0  . ARIPiprazole (ABILIFY) 2 MG tablet Take 1 tablet (2 mg total) by mouth daily. Take with 5 mg=[id number] tablet 0  . ARIPiprazole (ABILIFY) 5 MG tablet Take 1 tablet (5 mg total) by mouth daily. Take with 2 mg =  103m. 90 tablet 0  . aspirin EC 325 MG tablet Take 1 tablet (325 mg total) by mouth daily. 30 tablet 0  . atorvastatin (LIPITOR) 40 MG tablet     . buPROPion (WELLBUTRIN XL) 150 MG 24 hr tablet Take 3 tablets (450 mg total) by mouth daily. 270 tablet 0  . CALCIUM-VITAMIN D PO Take 1 tablet by mouth daily.     . carbidopa-levodopa (SINEMET IR) 25-100 MG tablet Take 1 tablet by mouth 3 (three) times daily. 60 tablet 4  . gabapentin (NEURONTIN) 300 MG capsule Take 300 mg by mouth 3 (three) times daily.    . Melatonin 5 MG TABS Take by mouth.    . metoprolol tartrate (LOPRESSOR) 25 MG tablet 25 mg 2 (two) times daily.      . Multiple Vitamin (MULTIVITAMIN) tablet Take 1 tablet by mouth daily.    . multivitamin-lutein (OCUVITE-LUTEIN) CAPS Take 1 capsule by mouth daily.    . sertraline (ZOLOFT) 100 MG tablet Take 1 tablet (100 mg total) by mouth daily. 90 tablet 0  . terbinafine (LAMISIL) 250 MG tablet Take 250 mg by mouth daily.    .Marland KitchentiZANidine (ZANAFLEX) 2 MG tablet Take by mouth every 6 (six) hours as needed for muscle spasms.    . traZODone (DESYREL) 50 MG tablet Take 2 tablets (100 mg total) by mouth at bedtime as needed for sleep. 90 tablet 0  . lovastatin (MEVACOR) 40 MG tablet Take 40 mg by mouth daily.     No facility-administered medications prior to visit.      Allergies:   Percodan [oxycodone-aspirin] and Oxycodone   Social History   Socioeconomic History  . Marital status: Widowed    Spouse name: Not on file  . Number of children: Not on file  . Years of education: Not on file  . Highest education level: Not on file  Occupational History  . Not on file  Social Needs  . Financial resource strain: Not on file  . Food insecurity:    Worry: Not on file    Inability: Not on file  . Transportation needs:    Medical: Not on file    Non-medical: Not on file  Tobacco Use  . Smoking status: Never Smoker  . Smokeless tobacco: Never Used  Substance and Sexual Activity  . Alcohol use: Yes    Alcohol/week: 1.0 standard drinks    Types: 1 Cans of beer per week    Comment: daily  . Drug use: Never  . Sexual activity: Not on file  Lifestyle  . Physical activity:    Days per week: Not on file    Minutes per session: Not on file  . Stress: Not on file  Relationships  . Social connections:    Talks on phone: Not on file    Gets together: Not on file    Attends religious service: Not on file    Active member of club or organization: Not on file    Attends meetings of clubs or organizations: Not on file    Relationship status: Not on file  Other Topics Concern  . Not on file  Social  History Narrative  . Not on file     Social history is notable in that she is widowed for 32 years.  Her husband died in 89in his 553s  Lives by herself and has a cat.  No tobacco history.  She typically drinks 1 beer per night.  She does not exercise.  Family History:  The patient's family history includes Alzheimer's disease in her father; Diabetes in her mother; Heart disease in her father and mother; Hyperlipidemia in her father and mother; Hypertension in her father and mother.  Died at age 24 and had a stroke in 2014.  Her father was adopted and died at age 85.  She has 1 sister who had lung cancer and died at age 47.  She has an adopted son now 45 years old.  ROS General: Negative; No fevers, chills, or night sweats;  HEENT: Negative; No changes in vision or hearing, sinus congestion, difficulty swallowing Pulmonary: Negative; No cough, wheezing, shortness of breath, hemoptysis Cardiovascular: HPI GI: Negative; No nausea, vomiting, diarrhea, or abdominal pain GU: Negative; No dysuria, hematuria, or difficulty voiding Musculoskeletal: Negative; no myalgias, joint pain, or weakness Hematologic/Oncology: Negative; no easy bruising, bleeding Endocrine: Negative; no heat/cold intolerance; no diabetes Neuro: MR, started on Sinemet, not definitively diagnosed with Parkinson's.  History of CVA 2012. Skin: Negative; No rashes or skin lesions Psychiatric: Negative; No behavioral problems, depression Sleep: Negative; No snoring, daytime sleepiness, hypersomnolence, bruxism, restless legs, hypnogognic hallucinations, no cataplexy Other comprehensive 14 point system review is negative.   An Epworth sleepiness scale score was calculated in the office today is endorsed at 5, arguing against excessive daytime sleepiness.   PHYSICAL EXAM:   VS:  BP 120/62 (BP Location: Left Arm, Patient Position: Sitting, Cuff Size: Normal)   Pulse (!) 58   Ht 5' 2"  (1.575 m)   Wt 136 lb (61.7 kg)   BMI  24.87 kg/m     Repeat blood pressure by me 118/64.  Wt Readings from Last 3 Encounters:  11/24/18 136 lb (61.7 kg)  11/22/18 137 lb (62.1 kg)  08/02/18 143 lb 3.2 oz (65 kg)    General: Alert, oriented, no distress.  Skin: normal turgor, no rashes, warm and dry HEENT: Normocephalic, atraumatic. Pupils equal round and reactive to light; sclera anicteric; extraocular muscles intact; Fundi without hemorrhages or exudates. Nose without nasal septal hypertrophy Mouth/Parynx benign; Mallinpatti scale 3 Neck: No JVD, no carotid bruits; normal carotid upstroke Lungs: clear to ausculatation and percussion; no wheezing or rales Chest wall: without tenderness to palpitation Heart: PMI not displaced, heart rate in the upper 50s to 60s with mild irregularity, s1 s2 normal, 1/6 systolic murmur, no diastolic murmur, no rubs, gallops, thrills, or heaves Abdomen: soft, nontender; no hepatosplenomehaly, BS+; abdominal aorta nontender and not dilated by palpation. Back: no CVA tenderness Pulses 2+ Musculoskeletal: full range of motion, normal strength, no joint deformities Extremities: Minimal hand tremor. No cogwheel rigidity; no clubbing, cyanosis or edema, Homan's sign negative  Neurologic: grossly nonfocal; Cranial nerves grossly wnl Psychologic: Normal mood and affect   Studies/Labs Reviewed:   EKG:  EKG  ordered today.  Sinus bradycardia at 58 bpm with mild sinus arrhythmia.  No ectopy.  Borderline first-degree block with appeared normal at 204 ms.  Recent Labs: BMP Latest Ref Rng & Units 05/26/2018 09/16/2014 09/15/2014  Glucose 65 - 99 mg/dL 94 130(H) 130(H)  BUN 8 - 27 mg/dL 21 16 25(H)  Creatinine 0.57 - 1.00 mg/dL 0.88 0.69 0.85  BUN/Creat Ratio 12 - 28 24 - -  Sodium 134 - 144 mmol/L 140 138 140  Potassium 3.5 - 5.2 mmol/L 4.5 4.2 4.1  Chloride 96 - 106 mmol/L 101 102 103  CO2 20 - 29 mmol/L 21 23 21   Calcium 8.7 - 10.3 mg/dL 10.1 9.0 10.0     Hepatic  Function Latest Ref Rng &  Units 05/26/2018 03/17/2011 03/12/2011  Total Protein 6.0 - 8.5 g/dL 7.2 7.1 6.5  Albumin 3.5 - 4.8 g/dL 4.4 3.4(L) 3.3(L)  AST 0 - 40 IU/L 25 33 18  ALT 0 - 32 IU/L 22 48(H) 18  Alk Phosphatase 39 - 117 IU/L 111 92 81  Total Bilirubin 0.0 - 1.2 mg/dL 0.3 0.3 0.3    CBC Latest Ref Rng & Units 05/26/2018 09/17/2014 09/16/2014  WBC 3.4 - 10.8 x10E3/uL 5.8 8.6 8.9  Hemoglobin 11.1 - 15.9 g/dL 11.9 11.0(L) 12.1  Hematocrit 34.0 - 46.6 % 35.9 33.6(L) 37.1  Platelets 150 - 450 x10E3/uL 396 300 351   Lab Results  Component Value Date   MCV 89 05/26/2018   MCV 91.1 09/17/2014   MCV 90.5 09/16/2014   Lab Results  Component Value Date   TSH 1.160 05/26/2018   Lab Results  Component Value Date   HGBA1C (H) 03/11/2011    6.0 (NOTE)                                                                       According to the ADA Clinical Practice Recommendations for 2011, when HbA1c is used as a screening test:   >=6.5%   Diagnostic of Diabetes Mellitus           (if abnormal result  is confirmed)  5.7-6.4%   Increased risk of developing Diabetes Mellitus  References:Diagnosis and Classification of Diabetes Mellitus,Diabetes XIPJ,8250,53(ZJQBH 1):S62-S69 and Standards of Medical Care in         Diabetes - 2011,Diabetes Care,2011,34  (Suppl 1):S11-S61.     BNP No results found for: BNP  ProBNP No results found for: PROBNP   Lipid Panel     Component Value Date/Time   CHOL 236 (H) 03/12/2011 0625   TRIG 216 (H) 03/12/2011 0625   HDL 72 03/12/2011 0625   CHOLHDL 3.3 03/12/2011 0625   VLDL 43 (H) 03/12/2011 0625   LDLCALC (H) 03/12/2011 0625    121        Total Cholesterol/HDL:CHD Risk Coronary Heart Disease Risk Table                     Men   Women  1/2 Average Risk   3.4   3.3  Average Risk       5.0   4.4  2 X Average Risk   9.6   7.1  3 X Average Risk  23.4   11.0        Use the calculated Patient Ratio above and the CHD Risk Table to determine the patient's CHD Risk.        ATP  III CLASSIFICATION (LDL):  <100     mg/dL   Optimal  100-129  mg/dL   Near or Above                    Optimal  130-159  mg/dL   Borderline  160-189  mg/dL   High  >190     mg/dL   Very High     RADIOLOGY: No results found.   Additional studies/ records that were reviewed today include:  I reviewed the records for neurologic  Associates.  ASSESSMENT:    1. Essential hypertension   2. Irregular heartbeat   3. History of stroke   4. Hyperlipidemia with target LDL less than 70   5. Tremor of both hands   6. Depression, unspecified depression type     PLAN:  Ms. Alcorta is a 73 year old female who has a history of hypertension as well as hyperlipidemia.  Suffered a CVA in 2012 and on most recent MRI evidence for an old right pontine infarct dish and she had chronic microvascular ischemia and generalized central atrophy.  Not have an overt diagnosis for Parkinson's but has had tremor and was recently started on carbidopa/levodopa.  Has recently noticed heart rate irregularity.  ECG today reveals sinus bradycardia at 58 bpm with sinus arrhythmia without significant ectopy and borderline first-degree AV block.  There is some question as to her medications.  We subsequently contacted her office and even though her med sheet states that she is on lovastatin 40 mg as well as atorvastatin apparently she was recently started on atorvastatin.  If she is taking both I have recommended she discontinue lovastatin.  Recommending she undergo 2D echo Doppler study to assess for structural heart disease particularly with her hypertensive history.  I am scheduling her for 48-hour event monitor the weight potential heart rhythm irregularity.  Pressure is stable today.  History of depression and is on Abilify milligrams daily and also takes trazodone and Zoloft.  She denies significant sleep issues.  I will see her in 6 to 8 weeks for follow-up evaluation.   Medication Adjustments/Labs and Tests  Ordered: Current medicines are reviewed at length with the patient today.  Concerns regarding medicines are outlined above.  Medication changes, Labs and Tests ordered today are listed in the Patient Instructions below. Patient Instructions  Medication Instructions:  Stop Lovastatin Continue Atorvastatin as prescribed. If you need a refill on your cardiac medications before your next appointment, please call your pharmacy.   Testing/Procedures: Your physician has recommended that you wear a holter monitor. Holter monitors are medical devices that record the heart's electrical activity. Doctors most often use these monitors to diagnose arrhythmias. Arrhythmias are problems with the speed or rhythm of the heartbeat. The monitor is a small, portable device. You can wear one while you do your normal daily activities. This is usually used to diagnose what is causing palpitations/syncope (passing out).  Echocardiogram - Your physician has requested that you have an echocardiogram. Echocardiography is a painless test that uses sound waves to create images of your heart. It provides your doctor with information about the size and shape of your heart and how well your heart's chambers and valves are working. This procedure takes approximately one hour. There are no restrictions for this procedure. This will be performed at our [facility] location - [address], [address].   Follow-Up: At [facility], you and your health needs are our priority.  As part of our continuing mission to provide you with exceptional heart care, we have created designated Provider Care Teams.  These Care Teams include your primary Cardiologist (physician) and Advanced Practice Providers (APPs -  Physician Assistants and Nurse Practitioners) who all work together to provide you with the care you need, when you need it. You will need a follow up appointment in 2 months. Dennis Bast may see Dr.Kelly or one of the following Advanced  Practice Providers on your designated Care Team: Almyra Deforest, [place] . Fabian Sharp, PA-C  Signed, Shelva Majestic, MD  11/28/2018 12:15 PM    [facility] [address], [place], [place], [place]  [address] Phone: [phone]

## 2018-11-24 NOTE — Progress Notes (Signed)
I agree with the above plan 

## 2018-11-24 NOTE — Patient Instructions (Addendum)
Medication Instructions:  Stop Lovastatin Continue Atorvastatin as prescribed. If you need a refill on your cardiac medications before your next appointment, please call your pharmacy.   Testing/Procedures: Your physician has recommended that you wear a holter monitor. Holter monitors are medical devices that record the heart's electrical activity. Doctors most often use these monitors to diagnose arrhythmias. Arrhythmias are problems with the speed or rhythm of the heartbeat. The monitor is a small, portable device. You can wear one while you do your normal daily activities. This is usually used to diagnose what is causing palpitations/syncope (passing out).  Echocardiogram - Your physician has requested that you have an echocardiogram. Echocardiography is a painless test that uses sound waves to create images of your heart. It provides your doctor with information about the size and shape of your heart and how well your heart's chambers and valves are working. This procedure takes approximately one hour. There are no restrictions for this procedure. [facility] [address], [address].   Follow-Up: At [facility], you and your health needs are our priority.  As part of our continuing mission to provide you with exceptional heart care, we have created designated Provider Care Teams.  These Care Teams include your primary Cardiologist (physician) and Advanced Practice Providers (APPs -  Physician Assistants and Nurse Practitioners) who all work together to provide you with the care you need, when you need it. You will need a follow up appointment in 2 months. Bonita Quin.  You may see Dr.Kelly or one of the following Advanced Practice Providers on your designated Care Team: Azalee CourseHao Meng, [place]PA-C . Micah FlesherAngela Duke, PA-C

## 2018-11-28 ENCOUNTER — Encounter: Payer: Self-pay | Admitting: Cardiovascular Disease

## 2018-12-07 ENCOUNTER — Ambulatory Visit (HOSPITAL_COMMUNITY): Payer: Medicare Other | Attending: Cardiology

## 2018-12-07 ENCOUNTER — Ambulatory Visit (INDEPENDENT_AMBULATORY_CARE_PROVIDER_SITE_OTHER): Payer: Medicare Other

## 2018-12-07 DIAGNOSIS — I499 Cardiac arrhythmia, unspecified: Secondary | ICD-10-CM | POA: Diagnosis not present

## 2018-12-07 DIAGNOSIS — I10 Essential (primary) hypertension: Secondary | ICD-10-CM

## 2018-12-26 ENCOUNTER — Ambulatory Visit: Payer: Medicare Other | Admitting: Physician Assistant

## 2019-01-18 ENCOUNTER — Ambulatory Visit: Payer: Medicare Other | Admitting: Physician Assistant

## 2019-01-25 ENCOUNTER — Other Ambulatory Visit: Payer: Self-pay | Admitting: Physician Assistant

## 2019-01-27 ENCOUNTER — Telehealth: Payer: Self-pay

## 2019-01-27 NOTE — Telephone Encounter (Signed)
   Cardiac Questionnaire:    Since your last visit or hospitalization:    1. Have you been having new or worsening chest pain? NO   2. Have you been having new or worsening shortness of breath? NO 3. Have you been having new or worsening leg swelling, wt gain, or increase in abdominal girth (pants fitting more tightly)? NO   4. Have you had any passing out spells? NO         Primary Cardiologist: Dr.Kelly  Patient contacted.  History reviewed.  No symptoms to suggest any unstable cardiac conditions.  Based on discussion, with current pandemic situation, we will be postponing this appointment for Dana [name].  If symptoms change, she has been instructed to contact our office.   Routing to C19 CANCEL pool for tracking (P CV DIV CV19 CANCEL) and assigning priority (1 = 4-6 wks, 2 = 6-12 wks, 3 = >12 wks).  Priority 3  Cydney Ok, LPN  8/67/6720 94:70 PM         .

## 2019-01-30 ENCOUNTER — Ambulatory Visit: Payer: Medicare Other | Admitting: Cardiovascular Disease

## 2019-02-01 ENCOUNTER — Other Ambulatory Visit: Payer: Self-pay

## 2019-02-01 ENCOUNTER — Ambulatory Visit (INDEPENDENT_AMBULATORY_CARE_PROVIDER_SITE_OTHER): Payer: Medicare Other | Admitting: Physician Assistant

## 2019-02-01 ENCOUNTER — Encounter: Payer: Self-pay | Admitting: Physician Assistant

## 2019-02-01 DIAGNOSIS — G4700 Insomnia, unspecified: Secondary | ICD-10-CM

## 2019-02-01 DIAGNOSIS — F331 Major depressive disorder, recurrent, moderate: Secondary | ICD-10-CM

## 2019-02-01 MED ORDER — ARIPIPRAZOLE 5 MG PO TABS: 5.0000 mg | ORAL_TABLET | Freq: Every day | ORAL | 0 refills | Status: DC

## 2019-02-01 MED ORDER — SERTRALINE HCL 100 MG PO TABS: 200.0000 mg | ORAL_TABLET | Freq: Every day | ORAL | 0 refills | Status: DC

## 2019-02-01 MED ORDER — BUPROPION HCL ER (XL) 150 MG PO TB24: 450.0000 mg | ORAL_TABLET | Freq: Every day | ORAL | 0 refills | Status: DC

## 2019-02-01 MED ORDER — ARIPIPRAZOLE 2 MG PO TABS: 2.0000 mg | ORAL_TABLET | Freq: Every day | ORAL | 0 refills | Status: DC

## 2019-02-01 MED ORDER — TRAZODONE HCL 50 MG PO TABS: 50.0000 mg | ORAL_TABLET | Freq: Every evening | ORAL | 0 refills | Status: DC | PRN

## 2019-02-01 NOTE — Progress Notes (Signed)
[facility]  Patient ID: Dana [name],  MRN: [id number]  PCP: Juluis Rainier, MD  Date of Evaluation: 02/01/2019 Time spent:15 minutes  Chief Complaint:  Chief Complaint    Follow-up     Virtual Visit via Telephone Note  I connected with Dana [name] on 02/01/19 at 11:30 AM EDT by telephone and verified that I am speaking with the correct person using two identifiers.   I discussed the limitations, risks, security and privacy concerns of performing an evaluation and management service by telephone and the availability of in person appointments. I also discussed with the patient that there may be a patient responsible charge related to this service. The patient expressed understanding and agreed to proceed.  HISTORY/CURRENT STATUS: HPI  For 2 month med check.  At the last visit, we increased the Abilify to a total of 7 mg.  States that she is feeling a lot better and has more motivation and energy. She denies worsening of tremor or other possible side effects from the Abilify.  "I do feel better on the Abilify it I think happy with all the meds like they are."  Patient denies loss of interest in usual activities and is able to enjoy things.  Denies decreased energy or motivation.  Appetite has not changed.  No extreme sadness, tearfulness, or feelings of hopelessness.  Denies any changes in concentration, making decisions or remembering things.  Denies suicidal or homicidal thoughts.  Denies muscle or joint pain, stiffness, or dystonia.  Denies dizziness, syncope, seizures, numbness, tingling, tremor, tics, unsteady gait, slurred speech, confusion.   Individual Medical History/ Review of Systems: Changes? :Yes had a toenail removed from big toe. And then she fell the same day at the pharmacy.  She hit her head and had a concussion.  She is fine now except she still has a lump on her forehead.  Past medications for mental health diagnoses include: Zoloft, Prozac,  Wellbutrin, trazodone, Abilify  Allergies: Percodan [oxycodone-aspirin] and Oxycodone  Current Medications:  Current Outpatient Medications:  .  acetaminophen (TYLENOL) 500 MG tablet, Take 500 mg by mouth every 4 (four) hours as needed for moderate pain. 2 daily, Disp: , Rfl:  .  alendronate (FOSAMAX) 70 MG tablet, , Disp: , Rfl:  .  amLODipine (NORVASC) 5 MG tablet, Take 5 mg by mouth 2 (two) times daily., Disp: , Rfl:  .  ARIPiprazole (ABILIFY) 2 MG tablet, Take 1 tablet (2 mg total) by mouth daily. Take with 5 mg=7mg , Disp: 90 tablet, Rfl: 0 .  ARIPiprazole (ABILIFY) 2 MG tablet, Take 1 tablet (2 mg total) by mouth daily. Take with the 5 mg to equal 7mg  daily., Disp: 90 tablet, Rfl: 0 .  atorvastatin (LIPITOR) 40 MG tablet, , Disp: , Rfl:  .  buPROPion (WELLBUTRIN XL) 150 MG 24 hr tablet, Take 3 tablets (450 mg total) by mouth daily., Disp: 270 tablet, Rfl: 0 .  CALCIUM-VITAMIN D PO, Take 1 tablet by mouth daily. , Disp: , Rfl:  .  carbidopa-levodopa (SINEMET IR) 25-100 MG tablet, Take 1 tablet by mouth 3 (three) times daily., Disp: 60 tablet, Rfl: 4 .  gabapentin (NEURONTIN) 300 MG capsule, Take 300 mg by mouth 3 (three) times daily., Disp: , Rfl:  .  Melatonin 5 MG TABS, Take by mouth., Disp: , Rfl:  .  metoprolol tartrate (LOPRESSOR) 25 MG tablet, 25 mg 2 (two) times daily., Disp: , Rfl:  .  Multiple Vitamin (MULTIVITAMIN) tablet, Take 1 tablet by mouth daily., Disp: ,  Rfl:  .  multivitamin-lutein (OCUVITE-LUTEIN) CAPS, Take 1 capsule by mouth daily., Disp: , Rfl:  .  sertraline (ZOLOFT) 100 MG tablet, Take 2 tablets (200 mg total) by mouth daily., Disp: 180 tablet, Rfl: 0 .  terbinafine (LAMISIL) 250 MG tablet, Take 250 mg by mouth daily., Disp: , Rfl:  .  tiZANidine (ZANAFLEX) 2 MG tablet, Take by mouth every 6 (six) hours as needed for muscle spasms., Disp: , Rfl:  .  traZODone (DESYREL) 50 MG tablet, Take 1 tablet (50 mg total) by mouth at bedtime as needed for sleep., Disp: 90  tablet, Rfl: 0 .  ARIPiprazole (ABILIFY) 5 MG tablet, Take 1 tablet (5 mg total) by mouth daily. Take with 2 mg = 7mg ., Disp: 90 tablet, Rfl: 0 .  aspirin EC 325 MG tablet, Take 1 tablet (325 mg total) by mouth daily. (Patient not taking: Reported on 02/01/2019), Disp: 30 tablet, Rfl: 0 Medication Side Effects: none  Family Medical/ Social History: Changes? No  MENTAL HEALTH EXAM:  There were no vitals taken for this visit.There is no height or weight on file to calculate BMI.  General Appearance: Unable to assess because this is a phone visit.  Eye Contact:  Unable to assess  Speech:  Clear and Coherent  Volume:  Normal  Mood:  Euthymic  Affect:  Unable to assess  Thought Process:  Goal Directed  Orientation:  Full (Time, Place, and Person)  Thought Content: Logical   Suicidal Thoughts:  No  Homicidal Thoughts:  No  Memory:  WNL  Judgement:  Good  Insight:  Good  Psychomotor Activity:  Normal  Concentration:  Concentration: Good  Recall:  Good  Fund of Knowledge: Good  Language: Good  Assets:  Desire for Improvement  ADL's:  Intact  Cognition: WNL  Prognosis:  Good    DIAGNOSES:    ICD-10-CM   1. Major depressive disorder, recurrent episode, moderate (HCC) F33.1   2. Insomnia, unspecified type G47.00     Receiving Psychotherapy: No    RECOMMENDATIONS: I am glad to know she is doing so well! Continue Abilify 5 mg +2 mg tablets equals 7 mg. Continue Wellbutrin XL 450 mg daily. Continue Zoloft 200 mg daily (I had made a mistake in the chart at the past visit, it is supposed to be 200 mg not 100 mg) Continue trazodone 50 mg 1-2 nightly as needed. Return in 3 months.   Melony Overly, PA-C   This record has been created using [employer].  Chart creation errors have been sought, but may not always have been located and corrected. Such creation errors do not reflect on the standard of medical care.

## 2019-02-22 ENCOUNTER — Telehealth: Payer: Self-pay

## 2019-02-22 NOTE — Telephone Encounter (Signed)
I called patient that her visit will be change to video due to COVID 19. I ask if she had a phone or computer that has a camera on it. Pt stated she does not have a cell phone with camera on it. I stated it will be a telephone visit. Pt verified phone in system. She gave consent to telephone visit and file insurance.Med list was updated and pharmacy

## 2019-02-28 ENCOUNTER — Encounter: Payer: Self-pay | Admitting: Adult Health

## 2019-02-28 ENCOUNTER — Ambulatory Visit (INDEPENDENT_AMBULATORY_CARE_PROVIDER_SITE_OTHER): Payer: Medicare Other | Admitting: Adult Health

## 2019-02-28 ENCOUNTER — Other Ambulatory Visit: Payer: Self-pay

## 2019-02-28 DIAGNOSIS — E785 Hyperlipidemia, unspecified: Secondary | ICD-10-CM

## 2019-02-28 DIAGNOSIS — G20C Parkinsonism, unspecified: Secondary | ICD-10-CM

## 2019-02-28 DIAGNOSIS — G20 Parkinson's disease: Secondary | ICD-10-CM

## 2019-02-28 DIAGNOSIS — Z8673 Personal history of transient ischemic attack (TIA), and cerebral infarction without residual deficits: Secondary | ICD-10-CM | POA: Diagnosis not present

## 2019-02-28 DIAGNOSIS — I10 Essential (primary) hypertension: Secondary | ICD-10-CM

## 2019-02-28 MED ORDER — CARBIDOPA-LEVODOPA 25-100 MG PO TABS: 1.0000 | ORAL_TABLET | Freq: Three times a day (TID) | ORAL | 3 refills | Status: DC

## 2019-02-28 NOTE — Progress Notes (Signed)
[facility] [address] [place]. [place] [address] [phone]     Virtual Visit via Telephone Note  I connected with [name] on 02/28/19 at  1:45 PM EDT by telephone located remotely within my own home and verified that I am speaking with the correct person using two identifiers who reports being located in her own home.    I discussed the limitations, risks, security and privacy concerns of performing an evaluation and management service by telephone and the availability of in person appointments. I also discussed with the patient that there may be a patient responsible charge related to this service. The patient expressed understanding and agreed to proceed.   History of Present Illness:  Dana [name] is a 73 y.o. female who has been followed in this office for parkinsonian tremor.  She was initially scheduled for face-to-face follow-up office visit today at this time but due to COVID19, face-to-face office visit rescheduled for non-face-to-face telephone visit.  Tremors continue to be stable with use of Sinemet 1 tab 3 times daily.  She takes 1 upon awaking (7-8am), 1 tab at noon and 1 tab late afternoon. She continues to be able to perform activities and ADLs without difficulty.  Continues to ambulate with cane without any recent falls.  At prior visit, loculated irregular heartbeat and referred to cardiology for potential cardiac monitor and further work-up.  48-hour cardiac monitor negative for concerning arrhythmias.  No further concerns at this time.     Observations/Objective:  General: Frail elderly Caucasian lady seated, in no evident distress Mental Status: Awake and fully alert. Oriented to place and time. Recent and remote memory intact. Attention span, concentration and fund of knowledge appropriate. Mood and affect appropriate  HOLTER MONITOR 12/07/2018 The patient underwent 48-hour Holter monitor evaluation commencing on December 07, 2018.  The  predominant rhythm was sinus rhythm at 85 bpm.  There was first-degree AV block.  The slowest heart rate was sinus bradycardia at 57 bpm.  The maximal heart rate was sinus tachycardia at 148 bpm at 5:48 PM.  There were very rare ectopic complexes with 0.01% of isolated PACs and 0.04% of isolated PVCs.  There were no episodes of couplets either atrial or ventricular.  There were no episodes of atrial fibrillation, SVT, VT, or pauses.   Assessment and Plan:  Dana [name] is a 73 year old Caucasian lady with 4 year history of progressive upper extremity tremor and gait and balance difficulties and frequent falls likely due to Parkinson's disease. Remote history of right pontine lacunar infarct due to small vessel disease with mild residual left hemiparesis.    She endorses continued benefit with use of Sinemet for parkinsonian-like tremors without any reported side effects.   -Continue Sinemet 1 tab 3 times daily for tremors -prescription refilled and sent to mail pharmacy -Continue aspirin and Lipitor for secondary stroke prevention -Continue to follow with PCP for HTN and HLD management -Advised to continue to stay active and continue healthy diet   Follow Up Instructions:  Follow-up in 1 year or call earlier if needed     I discussed the assessment and treatment plan with the patient.  The patient was provided an opportunity to ask questions and all were answered to their satisfaction. The patient agreed with the plan and verbalized an understanding of the instructions.   I provided 23 minutes of non-face-to-face time during this encounter.    George Hugh, AGNP-BC  [facility] [address] [place], [place] [address]  Phone [phone] Fax [phone] Note: This document was prepared with digital dictation and possible smart phrase technology. Any transcriptional errors that result from this process are unintentional.

## 2019-02-28 NOTE — Progress Notes (Signed)
I agree with the above plan 

## 2019-03-02 ENCOUNTER — Telehealth: Payer: Self-pay | Admitting: Cardiovascular Disease

## 2019-03-02 NOTE — Telephone Encounter (Signed)
Called to schedule appointment, but, phone rang then hung up twice.

## 2019-03-22 ENCOUNTER — Other Ambulatory Visit: Payer: Self-pay | Admitting: Physician Assistant

## 2019-03-28 ENCOUNTER — Other Ambulatory Visit: Payer: Self-pay | Admitting: Physician Assistant

## 2019-04-18 ENCOUNTER — Ambulatory Visit: Payer: Medicare Other | Admitting: Physician Assistant

## 2019-04-18 ENCOUNTER — Encounter: Payer: Self-pay | Admitting: Physician Assistant

## 2019-04-18 ENCOUNTER — Other Ambulatory Visit: Payer: Self-pay

## 2019-04-18 DIAGNOSIS — G4700 Insomnia, unspecified: Secondary | ICD-10-CM

## 2019-04-18 DIAGNOSIS — F331 Major depressive disorder, recurrent, moderate: Secondary | ICD-10-CM

## 2019-04-18 NOTE — Progress Notes (Signed)
[facility]  Patient ID: Dana [name],  MRN: [id number]  PCP: Juluis RainierBarnes, [name], MD  Date of Evaluation: 04/18/2019 Time spent:15 minutes  Chief Complaint:  Chief Complaint    Depression; Follow-up      HISTORY/CURRENT STATUS: HPI For routine med check.   Doing better!  Increasing the Abilify has helped her mood. No SE from it.  Is more able to enjoy things and has a little more energy and pep.  Motivation is a little better as well.  Due to the coronavirus pandemic, she has not been able to go anywhere much anyway but she is a homebody so it has not matter that much.  She has not been reading very much lately.  That is unusual for her.  Denies crying easily.  States she did get emotional a few weeks ago when she was listening to [employer] music which is something that her mother loved.  But states that is not a common occurrence.  She is sleeping fairly well.  She is hesitant to take the trazodone every night but it does work.  Not having a lot of anxiety.  The Abilify is costing her around $300 for a 412-month supply.  States she will not be able to keep that up.  Denies dizziness, syncope, seizures, numbness, tingling, tics, unsteady gait, slurred speech, confusion.  Her tremor is about the same.  She has seen her neurologist within the past few months.  No changes in her treatment were made. Denies muscle or joint pain, stiffness, or dystonia.  Individual Medical History/ Review of Systems: Changes? :Yes  When she was at her neurologist earlier this year, she was found to have an irregular heartbeat.  She was sent to cardiology and had work-up including an echocardiogram and Holter monitor.  Due to the coronavirus pandemic she has not been able to get in with the cardiologist to go over the results.  She denies any cardiac symptoms.  "I feel pretty sure that everything was okay or else they would have gotten me in much sooner."  Past medications for mental health  diagnoses include: Zoloft, Prozac, Wellbutrin, trazodone, Abilify  Allergies: Percodan [oxycodone-aspirin] and Oxycodone  Current Medications:  Current Outpatient Medications:  .  acetaminophen (TYLENOL) 500 MG tablet, Take 500 mg by mouth every 4 (four) hours as needed for moderate pain. 2 daily, Disp: , Rfl:  .  alendronate (FOSAMAX) 70 MG tablet, , Disp: , Rfl:  .  amLODipine (NORVASC) 5 MG tablet, Take 5 mg by mouth 2 (two) times daily., Disp: , Rfl:  .  ARIPiprazole (ABILIFY) 2 MG tablet, Take 1 tablet (2 mg total) by mouth daily. Take with the 5 mg to equal 7mg  daily., Disp: 90 tablet, Rfl: 0 .  ARIPiprazole (ABILIFY) 5 MG tablet, Take 1 tablet (5 mg total) by mouth daily. Take with 2 mg = 7mg ., Disp: 90 tablet, Rfl: 0 .  aspirin EC 325 MG tablet, Take 1 tablet (325 mg total) by mouth daily., Disp: 30 tablet, Rfl: 0 .  atorvastatin (LIPITOR) 40 MG tablet, , Disp: , Rfl:  .  buPROPion (WELLBUTRIN XL) 150 MG 24 hr tablet, TAKE 3 TABLETS BY MOUTH  DAILY, Disp: 270 tablet, Rfl: 0 .  CALCIUM-VITAMIN D PO, Take 1 tablet by mouth daily. , Disp: , Rfl:  .  carbidopa-levodopa (SINEMET IR) 25-100 MG tablet, Take 1 tablet by mouth 3 (three) times daily., Disp: 270 tablet, Rfl: 3 .  gabapentin (NEURONTIN) 300 MG capsule, Take 300  mg by mouth 3 (three) times daily., Disp: , Rfl:  .  Melatonin 5 MG TABS, Take by mouth., Disp: , Rfl:  .  metoprolol tartrate (LOPRESSOR) 25 MG tablet, 25 mg 2 (two) times daily., Disp: , Rfl:  .  Multiple Vitamin (MULTIVITAMIN) tablet, Take 1 tablet by mouth daily., Disp: , Rfl:  .  multivitamin-lutein (OCUVITE-LUTEIN) CAPS, Take 1 capsule by mouth daily., Disp: , Rfl:  .  sertraline (ZOLOFT) 100 MG tablet, TAKE 2 TABLETS BY MOUTH  DAILY, Disp: 180 tablet, Rfl: 0 .  tiZANidine (ZANAFLEX) 2 MG tablet, Take by mouth every 6 (six) hours as needed for muscle spasms., Disp: , Rfl:  .  traZODone (DESYREL) 50 MG tablet, Take 1 tablet (50 mg total) by mouth at bedtime as needed  for sleep., Disp: 90 tablet, Rfl: 0 .  terbinafine (LAMISIL) 250 MG tablet, Take 250 mg by mouth daily., Disp: , Rfl:  Medication Side Effects: none  Family Medical/ Social History: Changes? No   MENTAL HEALTH EXAM:  There were no vitals taken for this visit.There is no height or weight on file to calculate BMI.  General Appearance: Casual  Eye Contact:  Good  Speech:  Clear and Coherent  Volume:  Normal  Mood:  Euthymic  Affect:  Appropriate  Thought Process:  Goal Directed  Orientation:  Full (Time, Place, and Person)  Thought Content: Logical   Suicidal Thoughts:  No  Homicidal Thoughts:  No  Memory:  WNL  Judgement:  Good  Insight:  Good  Psychomotor Activity:  Tremor  Concentration:  Concentration: Good  Recall:  Good  Fund of Knowledge: Good  Language: Good  Assets:  Desire for Improvement  ADL's:  Intact  Cognition: WNL  Prognosis:  Good    DIAGNOSES:    ICD-10-CM   1. Major depressive disorder, recurrent episode, moderate (HCC) F33.1   2. Insomnia, unspecified type G47.00     Receiving Psychotherapy: No    RECOMMENDATIONS:  I gave her information on good Rx.  I priced the Abilify out-of-pocket cost at [employer],  for 2 mg, 90 pills would be $22.31.  5 mg for 90 pills is $25.85.  I gave her a good Rx card and explained how to use it.  When this needs to be refilled will send it in to the cheapest place, not to her mail order pharmacy most likely. Continue Abilify 2 mg +5 mg equals 7 mg every morning. Continue Wellbutrin XL 150 mg 3 p.o. every morning. Continue gabapentin 300 mg 1 p.o. 3 times daily. Continue melatonin 5 mg nightly as needed. Continue Zoloft 100 mg, 2 daily. Continue trazodone 50 mg nightly as needed.  I have assured her that this is safe, although many medications are less safe and elderly people.  So is sleep deprivation know and we have to weigh the benefits versus the risks.  I recommend that at least every other night, she take the  trazodone to help her rest.  She does not get up in the middle of the night to go to the restroom as she wears depends so that is not an issue or a fall risk, at least for that reason. Return in 3 months or sooner as needed.   Donnal Moat, PA-C   This record has been created using [employer].  Chart creation errors have been sought, but may not always have been located and corrected. Such creation errors do not reflect on the standard of medical care.

## 2019-05-08 ENCOUNTER — Other Ambulatory Visit: Payer: Self-pay | Admitting: Physician Assistant

## 2019-06-15 ENCOUNTER — Other Ambulatory Visit: Payer: Self-pay | Admitting: Physician Assistant

## 2019-07-19 ENCOUNTER — Ambulatory Visit: Payer: Medicare Other | Admitting: Physician Assistant

## 2019-07-21 ENCOUNTER — Encounter: Payer: Self-pay | Admitting: Physician Assistant

## 2019-07-21 ENCOUNTER — Ambulatory Visit (INDEPENDENT_AMBULATORY_CARE_PROVIDER_SITE_OTHER): Payer: Medicare Other | Admitting: Physician Assistant

## 2019-07-21 ENCOUNTER — Other Ambulatory Visit: Payer: Self-pay

## 2019-07-21 DIAGNOSIS — G4700 Insomnia, unspecified: Secondary | ICD-10-CM | POA: Diagnosis not present

## 2019-07-21 DIAGNOSIS — R251 Tremor, unspecified: Secondary | ICD-10-CM

## 2019-07-21 DIAGNOSIS — F331 Major depressive disorder, recurrent, moderate: Secondary | ICD-10-CM

## 2019-07-21 MED ORDER — ARIPIPRAZOLE 2 MG PO TABS: 2.0000 mg | ORAL_TABLET | Freq: Every day | ORAL | 1 refills | Status: DC

## 2019-07-21 MED ORDER — SERTRALINE HCL 100 MG PO TABS: 200.0000 mg | ORAL_TABLET | Freq: Every day | ORAL | 1 refills | Status: DC

## 2019-07-21 MED ORDER — ARIPIPRAZOLE 2 MG PO TABS: 2.0000 mg | ORAL_TABLET | Freq: Every day | ORAL | 0 refills | Status: DC

## 2019-07-21 MED ORDER — ARIPIPRAZOLE 5 MG PO TABS: 5.0000 mg | ORAL_TABLET | Freq: Every day | ORAL | 0 refills | Status: DC

## 2019-07-21 MED ORDER — TRAZODONE HCL 50 MG PO TABS: 50.0000 mg | ORAL_TABLET | Freq: Every evening | ORAL | 1 refills | Status: DC | PRN

## 2019-07-21 MED ORDER — ARIPIPRAZOLE 5 MG PO TABS: 5.0000 mg | ORAL_TABLET | Freq: Every day | ORAL | 1 refills | Status: DC
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
## 2019-09-29 MED ORDER — GABAPENTIN 300 MG PO CAPS
300.00 | ORAL_CAPSULE | ORAL | Status: DC
Start: 2019-09-29 — End: 2019-09-29

## 2019-09-29 MED ORDER — ONDANSETRON HCL 4 MG/2ML IJ SOLN
4.00 | INTRAMUSCULAR | Status: DC
Start: ? — End: 2019-09-29

## 2019-09-29 MED ORDER — TRAZODONE HCL 50 MG PO TABS
50.00 | ORAL_TABLET | ORAL | Status: DC
Start: ? — End: 2019-09-29

## 2019-09-29 MED ORDER — METOPROLOL TARTRATE 25 MG PO TABS
25.00 | ORAL_TABLET | ORAL | Status: DC
Start: 2019-09-29 — End: 2019-09-29

## 2019-09-29 MED ORDER — ENOXAPARIN SODIUM 40 MG/0.4ML ~~LOC~~ SOLN
40.00 | SUBCUTANEOUS | Status: DC
Start: 2019-09-29 — End: 2019-09-29

## 2019-09-29 MED ORDER — GENERIC EXTERNAL MEDICATION
5.00 | Status: DC
Start: ? — End: 2019-09-29

## 2019-09-29 MED ORDER — GENERIC EXTERNAL MEDICATION
5.00 | Status: DC
Start: 2019-09-29 — End: 2019-09-29

## 2019-09-29 MED ORDER — DEXTROMETHORPHAN-GUAIFENESIN 10-100 MG/5ML PO SYRP
5.00 | ORAL_SOLUTION | ORAL | Status: DC
Start: ? — End: 2019-09-29

## 2019-09-29 MED ORDER — OXYCODONE HCL 5 MG PO TABS
5.00 | ORAL_TABLET | ORAL | Status: DC
Start: ? — End: 2019-09-29

## 2019-09-29 MED ORDER — AMLODIPINE BESYLATE 5 MG PO TABS
5.00 | ORAL_TABLET | ORAL | Status: DC
Start: 2019-09-29 — End: 2019-09-29

## 2019-09-29 MED ORDER — TIZANIDINE HCL 4 MG PO TABS
2.00 | ORAL_TABLET | ORAL | Status: DC
Start: 2019-09-29 — End: 2019-09-29

## 2019-09-29 MED ORDER — BISACODYL 5 MG PO TBEC
10.00 | DELAYED_RELEASE_TABLET | ORAL | Status: DC
Start: ? — End: 2019-09-29

## 2019-09-29 MED ORDER — ACETAMINOPHEN 325 MG PO TABS
650.00 | ORAL_TABLET | ORAL | Status: DC
Start: ? — End: 2019-09-29

## 2019-09-29 MED ORDER — ATORVASTATIN CALCIUM 40 MG PO TABS
40.00 | ORAL_TABLET | ORAL | Status: DC
Start: 2019-09-30 — End: 2019-09-29

## 2019-09-29 MED ORDER — SERTRALINE HCL 100 MG PO TABS
100.00 | ORAL_TABLET | ORAL | Status: DC
Start: 2019-09-30 — End: 2019-09-29

## 2019-09-29 MED ORDER — BUPROPION HCL ER (SR) 100 MG PO TB12
100.00 | ORAL_TABLET | ORAL | Status: DC
Start: 2019-09-29 — End: 2019-09-29

## 2019-09-29 MED ORDER — GENERIC EXTERNAL MEDICATION
30.00 | Status: DC
Start: ? — End: 2019-09-29

## 2019-10-02 ENCOUNTER — Non-Acute Institutional Stay (SKILLED_NURSING_FACILITY): Payer: Medicare Other | Admitting: Internal Medicine

## 2019-10-02 ENCOUNTER — Encounter: Payer: Self-pay | Admitting: Internal Medicine

## 2019-10-02 DIAGNOSIS — E872 Acidosis, unspecified: Secondary | ICD-10-CM

## 2019-10-02 DIAGNOSIS — G9340 Encephalopathy, unspecified: Secondary | ICD-10-CM

## 2019-10-02 DIAGNOSIS — E876 Hypokalemia: Secondary | ICD-10-CM

## 2019-10-02 DIAGNOSIS — E871 Hypo-osmolality and hyponatremia: Secondary | ICD-10-CM

## 2019-10-02 DIAGNOSIS — E870 Hyperosmolality and hypernatremia: Secondary | ICD-10-CM

## 2019-10-02 DIAGNOSIS — I10 Essential (primary) hypertension: Secondary | ICD-10-CM | POA: Diagnosis not present

## 2019-10-02 DIAGNOSIS — F320 Major depressive disorder, single episode, mild: Secondary | ICD-10-CM

## 2019-10-02 DIAGNOSIS — G629 Polyneuropathy, unspecified: Secondary | ICD-10-CM

## 2019-10-02 NOTE — Progress Notes (Signed)
: Provider:  Margit HanksAlexander, Clint Strupp D., MD [facility]:  [facility] Room Number: 505-P Place of Service:  SNF ([phone])  PCP: Juluis RainierBarnes, [name], MD Patient Care Team: Juluis RainierBarnes, [name], MD as PCP - [facility](Family Medicine)  Extended Emergency Contact Information Primary Emergency Contact: [facility],Becky [address]          [place], [place] [address] [place] Home Phone: [phone] Relation: Friend Secondary Emergency Contact: Moishe Spiceholden, brock          [place], [place] [id number] [place] Mobile Phone: [phone] Relation: Son     Allergies: Percodan [oxycodone-aspirin] and Oxycodone  Chief Complaint  Patient presents with   New Admit To SNF    [facility] admission [facility]     HPI: Patient is a 73 y.o. female with hypertension, hyperlipidemia, OA, depression, who presented to [facility] via EMS to the ED with reference to a fall.  Patient complained of pain all over which was not to be helpful.  ER work-up showed metabolic acidosis, urine positive for ketones and leukocytosis unknown source, and hypothermia, tachycardia blood pressure normal.  Patient was [facility] from 11/16[facility]20 where sepsis was initially suspected and patient was started on empiric vancomycin and cefepime IV, blood cultures were negative, chest x-ray showed no evidence of pneumonia and after 48 hours patient's leukocytosis improved and sepsis was ruled out and antibiotics were dosed continued.  Patient was found to have hyponatremia which improved with IV fluids and metabolic encephalopathy secondary to metabolic acidosis which was secondary to diarrhea and dehydration.  Patient was given IV bicarb drip and potassium and phosphorus replacement and mental status did gradually improve. Patient became hypernatremic and was treated half-normal saline with resolution.  Patient is admitted to skilled nursing facility for OT/PT.   While at skilled nursing facility patient will be followed for hypertension treated with Norvasc and metoprolol, depression treated with Wellbutrin and neuropathy treated with Neurontin.   Past Medical History:  Diagnosis Date   Anemia    none since hysterectomy   Arthritis    LlTHA 11'15(had a fall)-"remains tender"   Depression    Heart murmur    history of   Hyperlipidemia    Hypertension    Stroke (HCC)    4-5 yrs ago(left sided weakness) -no residual   Transfusion history    age 73- "hysterectomy"    Past Surgical History:  Procedure Laterality Date   ABDOMINAL HYSTERECTOMY     COLONOSCOPY WITH PROPOFOL N/A 12/25/2014   Procedure: COLONOSCOPY WITH PROPOFOL;  Surgeon: Charolett BumpersMartin K Johnson, MD;  Location: [facility] ENDOSCOPY;  Service: Endoscopy;  Laterality: N/A;   FRACTURE SURGERY  01/22/12   fx of eye socket from fall-"retained plate"   HAND SURGERY Left    nerve release -many yrs ago   HIP ARTHROPLASTY Left 09/16/2014   Procedure: ARTHROPLASTY BIPOLAR HIP;  Surgeon: Sheral Apleyimothy D Murphy, MD;  [facility];  Service: Orthopedics;  Laterality: Left;    Allergies as of 10/02/2019      Reactions   Percodan [oxycodone-aspirin] Nausea Only   Oxycodone Nausea Only      Medication List       Accurate as of October 02, 2019  8:21 PM. If you have any questions, ask your nurse or doctor.        STOP taking these medications   acetaminophen 500 MG tablet Commonly known as: TYLENOL Stopped by: Merrilee SeashoreAnne Hai Grabe, MD  ARIPiprazole 2 MG tablet Commonly known as: Abilify Stopped by: Merrilee Seashore, MD   ARIPiprazole 5 MG tablet Commonly known as: ABILIFY Stopped by: Merrilee Seashore, MD   aspirin EC 325 MG tablet Stopped by: Merrilee Seashore, MD   CALCIUM-VITAMIN D PO Stopped by: Merrilee Seashore, MD   carbidopa-levodopa 25-100 MG tablet Commonly known as: SINEMET IR Stopped by: Merrilee Seashore, MD   Melatonin 3-10 MG Tabs Stopped by: Merrilee Seashore, MD   Melatonin  5 MG Tabs Stopped by: Merrilee Seashore, MD   multivitamin tablet Stopped by: Merrilee Seashore, MD   multivitamin-lutein Caps capsule Stopped by: Merrilee Seashore, MD   terbinafine 250 MG tablet Commonly known as: LAMISIL Stopped by: Merrilee Seashore, MD     TAKE these medications   alendronate 70 MG tablet Commonly known as: FOSAMAX Take 70 mg by mouth once a week. Take with 8 oz of water 30 minutes prior to food/meds. Do not lie down for 30 minutes.   amLODipine 5 MG tablet Commonly known as: NORVASC Take 5 mg by mouth 2 (two) times daily.   atorvastatin 40 MG tablet Commonly known as: LIPITOR Take 40 mg by mouth daily.   buPROPion 100 MG tablet Commonly known as: WELLBUTRIN Take 100 mg by mouth 2 (two) times daily. What changed: Another medication with the same name was removed. Continue taking this medication, and follow the directions you see here. Changed by: Merrilee Seashore, MD   feeding supplement (PRO-STAT SUGAR FREE 64) Liqd Take 30 mLs by mouth daily.   gabapentin 300 MG capsule Commonly known as: NEURONTIN Take 300 mg by mouth 3 (three) times daily.   metoprolol tartrate 25 MG tablet Commonly known as: LOPRESSOR 25 mg 2 (two) times daily.   sertraline 100 MG tablet Commonly known as: ZOLOFT Take 100 mg by mouth daily. What changed: Another medication with the same name was removed. Continue taking this medication, and follow the directions you see here. Changed by: Merrilee Seashore, MD   tiZANidine 2 MG tablet Commonly known as: ZANAFLEX Take 2 mg by mouth 2 (two) times daily. QAM and QHS   traZODone 50 MG tablet Commonly known as: DESYREL Take 50 mg by mouth 2 (two) times daily. QAM and QHS What changed: Another medication with the same name was removed. Continue taking this medication, and follow the directions you see here. Changed by: Merrilee Seashore, MD       No orders of the defined types were placed in this encounter.   Immunization History    Administered Date(s) Administered   Influenza, High Dose Seasonal PF 08/13/2017   Zoster Recombinat (Shingrix) 07/12/2019    Social History   Tobacco Use   Smoking status: Never Smoker   Smokeless tobacco: Never Used  Substance Use Topics   Alcohol use: Yes    Alcohol/week: 7.0 standard drinks    Types: 7 Cans of beer per week    Family history is   Family History  Problem Relation Age of Onset   Hyperlipidemia Mother    Hypertension Mother    Diabetes Mother    Heart disease Mother        aortic vavle  replaced   Hypertension Father    Hyperlipidemia Father    Heart disease Father        aortic valve replaced   Alzheimer's disease Father       Review of Systems  GENERAL:  no fevers, fatigue, appetite changes SKIN: No itching, or rash EYES: No eye pain,  redness, discharge EARS: No earache, tinnitus, change in hearing NOSE: No congestion, drainage or bleeding  MOUTH/THROAT: No mouth or tooth pain, No sore throat RESPIRATORY: No cough, wheezing, SOB CARDIAC: No chest pain, palpitations, lower extremity edema  GI: No abdominal pain, No N/V/D or constipation, No heartburn or reflux  GU: No dysuria, frequency or urgency, or incontinence  MUSCULOSKELETAL: No unrelieved bone/joint pain NEUROLOGIC: No headache, dizziness or focal weakness PSYCHIATRIC: No c/o anxiety or sadness   Vitals:   10/02/19 1127  BP: 123/60  Pulse: 75  Resp: 18  Temp: (!) 97 F (36.1 C)  SpO2: 97%    SpO2 Readings from Last 1 Encounters:  10/02/19 97%   Body mass index is 24.87 kg/m.     Physical Exam  GENERAL APPEARANCE: Alert, conversant,  No acute distress.  SKIN: No diaphoresis rash HEAD: Normocephalic, atraumatic  EYES: Conjunctiva/lids clear. Pupils round, reactive. EOMs intact.  EARS: External exam WNL, canals clear. Hearing grossly normal.  NOSE: No deformity or discharge.  MOUTH/THROAT: Lips w/o lesions  RESPIRATORY: Breathing is even, unlabored. Lung  sounds are clear   CARDIOVASCULAR: Heart RRR no murmurs, rubs or gallops. No peripheral edema.   GASTROINTESTINAL: Abdomen is soft, non-tender, not distended w/ normal bowel sounds. GENITOURINARY: Bladder non tender, not distended  MUSCULOSKELETAL: No abnormal joints or musculature NEUROLOGIC:  Cranial nerves 2-12 grossly intact ;left-sided weakness PSYCHIATRIC: Mood and affect appropriate to situation, no behavioral issues  Patient Active Problem List   Diagnosis Date Noted   Insomnia 09/07/2018   Major depressive disorder 07/25/2018   GAD (generalized anxiety disorder) 07/25/2018   Hip fracture, left (HCC) 09/15/2014   Hip fracture requiring operative repair (HCC) 09/15/2014   Hip fracture (HCC) 09/15/2014   Chronic back pain 09/15/2014   History of stroke    Hypertension    Hyperlipidemia    Essential hypertension    Spastic hemiplegia affecting nondominant side (HCC) 01/12/2012      Labs reviewed: Basic Metabolic Panel:    Component Value Date/Time   NA 140 05/26/2018 1354   K 4.5 05/26/2018 1354   CL 101 05/26/2018 1354   CO2 21 05/26/2018 1354   GLUCOSE 94 05/26/2018 1354   GLUCOSE 130 (H) 09/16/2014 0500   BUN 21 05/26/2018 1354   CREATININE 0.88 05/26/2018 1354   CALCIUM 10.1 05/26/2018 1354   PROT 7.2 05/26/2018 1354   ALBUMIN 4.4 05/26/2018 1354   AST 25 05/26/2018 1354   ALT 22 05/26/2018 1354   ALKPHOS 111 05/26/2018 1354   BILITOT 0.3 05/26/2018 1354   GFRNONAA 65 05/26/2018 1354   GFRAA 75 05/26/2018 1354    No results for input(s): NA, K, CL, CO2, GLUCOSE, BUN, CREATININE, CALCIUM, MG, PHOS in the last 8760 hours. Liver Function Tests: No results for input(s): AST, ALT, ALKPHOS, BILITOT, PROT, ALBUMIN in the last 8760 hours. No results for input(s): LIPASE, AMYLASE in the last 8760 hours. No results for input(s): AMMONIA in the last 8760 hours. CBC: No results for input(s): WBC, NEUTROABS, HGB, HCT, MCV, PLT in the last 8760  hours. Lipid No results for input(s): CHOL, HDL, LDLCALC, TRIG in the last 8760 hours.  Cardiac Enzymes: No results for input(s): CKTOTAL, CKMB, CKMBINDEX, TROPONINI in the last 8760 hours. BNP: No results for input(s): BNP in the last 8760 hours. No results found for: [facility] Lab Results  Component Value Date   HGBA1C (H) 03/11/2011    6.0 (NOTE)  According to the ADA Clinical Practice Recommendations for 2011, when HbA1c is used as a screening test:   >=6.5%   Diagnostic of Diabetes Mellitus           (if abnormal result  is confirmed)  5.7-6.4%   Increased risk of developing Diabetes Mellitus  References:Diagnosis and Classification of Diabetes Mellitus,Diabetes OYDX,4128,78(MVEHM 1):S62-S69 and Standards of Medical Care in         Diabetes - 2011,Diabetes CNOB,0962,83  (Suppl 1):S11-S61.   Lab Results  Component Value Date   TSH 1.160 05/26/2018   Lab Results  Component Value Date   [id number] 503 05/26/2018   No results found for: FOLATE No results found for: IRON, TIBC, FERRITIN  Imaging and Procedures obtained prior to SNF admission: Mr Jeri Cos Wo Contrast  Result Date: 06/03/2018  [facility] [address], [place], [place] [address] [phone] NEUROIMAGING REPORT STUDY DATE: 06/02/2018 PATIENT NAME: Dorethia Jeanmarie DOB: July 25, 1946 MRN: [id number] EXAM: MRI Brain with and without contrast contrast ORDERING CLINICIAN: Antony Contras, MD CLINICAL HISTORY: 73 year old woman with Parkinson's disease and history of stroke COMPARISON FILMS: MRI 03/11/2011, CT 01/26/2012 TECHNIQUE: MRI of the brain without contrast was obtained utilizing 5 mm axial slices with T1, T2, T2 flair, SWI and diffusion weighted views.  T1 sagittal and T2 coronal views were obtained.  After the infusion of contrast, additional T1-weighted images were performed. CONTRAST: 13 mL Multi[facility]ance IMAGING SITE: [facility],  [place], [place] FINDINGS: On sagittal images, the spinal cord is imaged caudally to C3-C4 and is normal in caliber.  There appears to be fusion of the C3 and C4 vertebral bodies posteriorly.  The contents of the posterior fossa are of normal size and position.   The pituitary gland and optic chiasm appear normal.   The third and lateral ventricles are mildly enlarged for age, in proportion to the extent of mild generalized cortical atrophy that has progressed when compared to the 2012 MRI.   There are no abnormal extra-axial collections of fluid.  There is a chronic infarction of the right pons with wallerian degeneration noted in the right cerebral peduncle.  The cerebellum appears normal.  The deep gray matter appears normal.  Hemispheres, there are extensive T2/FLAIR hyperintense foci in the periventricular, deep and subcortical white matter.  These changes have mildly progressed in a more confluent than they were in 2012.   Diffusion weighted images are normal.  Susceptibility weighted images are normal.  The VIIth/VIIIth nerve complex appears normal.   There has been a right lens replacement that has occurred since the 2012 MRI.  Additionally, the left orbital floor fracture that was acute in 2013 on the CT scan is no longer apparent though there appears to be mild orbital floor bone changes.  The mastoid air cells appear normal.  The paranasal sinuses appear normal.  Flow voids are identified within the major intracerebral arteries.   After the infusion of contrast, a normal enhancement pattern was observed.   This MRI of the brain with and without contrast shows the following: 1.    Mild generalized cortical atrophy that has progressed when compared to the 2012 MRI. 2.    Chronic infarction in the anterior right pons.  This was acute on the 2012 MRI.  There is associated wallerian degeneration in the right cerebral peduncle. 3.    Moderately severe chronic microvascular ischemic changes that  has progressed when compared to the 2012 MRI. 4.    There are  no acute findings.  There is a normal enhancement pattern. INTERPRETING PHYSICIAN: Richard A. Epimenio Foot, MD, PhD, FAAN Certified in  Neuroimaging by [employer]     Not all labs, radiology exams or other studies done during hospitalization come through on my EPIC note; however they are reviewed by me.    Assessment and Plan  Metabolic acidosis-hypothermic leukocytosis; initially thought sepsis and patient was started on empiric vancomycin and cefepime, cultures were negative, chest x-ray showed no evidence of pneumonia, leukocytosis improved, and after 48 hours antibiotics were stopped. Patient was treated with a bicarb drip and potassium and phosphorus replacement. SNF-admitted for OT/PT; follow-up BMP  Acute encephalopathy/hyponatremia/hypokalemia -improved with treatment of metabolic acidosis and dehydration; potassium was replaced SNF-follow-up BMP  Hyper natremia-occurred from treatment for hyponatremia; treated with half-normal saline with improvement SNF-follow-up BMP  Hypertension SNF-controlled; continue metoprolol 25 mg twice daily and Norvasc 5 mg twice daily  Depression SNF-not stated as uncontrolled; continue Wellbutrin 100 mg twice daily, Desyrel 50 mg twice daily and Zoloft 100 mg daily  Polyneuropathy SNF-no complaints; continue Neurontin 300 mg 3 times daily  Hyperlipidemia SNF-not stated as uncontrolled; continue Lipitor 40 mg daily    Time spent > 45 min;> 50% of time with patient was spent reviewing records, labs, tests and studies, counseling and developing plan of care  Margit Hanks, MD

## 2019-10-03 ENCOUNTER — Encounter: Payer: Self-pay | Admitting: Internal Medicine

## 2019-10-04 ENCOUNTER — Encounter: Payer: Self-pay | Admitting: Internal Medicine

## 2019-10-04 DIAGNOSIS — G9340 Encephalopathy, unspecified: Secondary | ICD-10-CM | POA: Insufficient documentation

## 2019-10-04 DIAGNOSIS — E871 Hypo-osmolality and hyponatremia: Secondary | ICD-10-CM | POA: Insufficient documentation

## 2019-10-04 DIAGNOSIS — E872 Acidosis, unspecified: Secondary | ICD-10-CM | POA: Insufficient documentation

## 2019-10-04 DIAGNOSIS — G629 Polyneuropathy, unspecified: Secondary | ICD-10-CM | POA: Insufficient documentation

## 2019-10-04 DIAGNOSIS — E876 Hypokalemia: Secondary | ICD-10-CM | POA: Insufficient documentation

## 2019-10-04 DIAGNOSIS — E870 Hyperosmolality and hypernatremia: Secondary | ICD-10-CM | POA: Insufficient documentation

## 2019-10-12 ENCOUNTER — Non-Acute Institutional Stay (SKILLED_NURSING_FACILITY): Payer: Medicare Other | Admitting: Internal Medicine

## 2019-10-12 ENCOUNTER — Encounter: Payer: Self-pay | Admitting: Internal Medicine

## 2019-10-12 DIAGNOSIS — G629 Polyneuropathy, unspecified: Secondary | ICD-10-CM | POA: Diagnosis not present

## 2019-10-12 DIAGNOSIS — I10 Essential (primary) hypertension: Secondary | ICD-10-CM

## 2019-10-12 DIAGNOSIS — G9340 Encephalopathy, unspecified: Secondary | ICD-10-CM | POA: Diagnosis not present

## 2019-10-12 DIAGNOSIS — E870 Hyperosmolality and hypernatremia: Secondary | ICD-10-CM

## 2019-10-12 DIAGNOSIS — Z8659 Personal history of other mental and behavioral disorders: Secondary | ICD-10-CM

## 2019-10-13 ENCOUNTER — Encounter: Payer: Self-pay | Admitting: Internal Medicine

## 2019-10-13 MED ORDER — GABAPENTIN 300 MG PO CAPS: 300.0000 mg | ORAL_CAPSULE | Freq: Three times a day (TID) | ORAL | 0 refills | Status: DC

## 2019-10-13 MED ORDER — AMLODIPINE BESYLATE 5 MG PO TABS: 5.0000 mg | ORAL_TABLET | Freq: Two times a day (BID) | ORAL | 0 refills | Status: DC

## 2019-10-13 MED ORDER — ALENDRONATE SODIUM 70 MG PO TABS: 70.0000 mg | ORAL_TABLET | ORAL | 0 refills | Status: DC

## 2019-10-13 MED ORDER — ATORVASTATIN CALCIUM 40 MG PO TABS: 40.0000 mg | ORAL_TABLET | Freq: Every day | ORAL | 0 refills | Status: DC

## 2019-10-13 MED ORDER — PRO-STAT SUGAR FREE PO LIQD: 30.0000 mL | Freq: Every day | ORAL | 0 refills | Status: DC

## 2019-10-13 MED ORDER — METOPROLOL TARTRATE 25 MG PO TABS: 25.0000 mg | ORAL_TABLET | Freq: Two times a day (BID) | ORAL | 0 refills | Status: DC

## 2019-10-13 MED ORDER — TIZANIDINE HCL 2 MG PO TABS: 2.0000 mg | ORAL_TABLET | Freq: Two times a day (BID) | ORAL | 0 refills | Status: DC

## 2019-10-13 MED ORDER — TRAZODONE HCL 50 MG PO TABS: 50.0000 mg | ORAL_TABLET | Freq: Two times a day (BID) | ORAL | 0 refills | Status: DC

## 2019-10-13 MED ORDER — SERTRALINE HCL 100 MG PO TABS: 100.0000 mg | ORAL_TABLET | Freq: Every day | ORAL | 0 refills | Status: DC

## 2019-10-13 MED ORDER — BUPROPION HCL 100 MG PO TABS: 100.0000 mg | ORAL_TABLET | Freq: Two times a day (BID) | ORAL | 0 refills | Status: DC

## 2019-10-13 NOTE — Progress Notes (Signed)
This is a discharge note.  Level of care skilled.  Facility is [facility].  Chief complaint-discharge note.  History of present illness.  Patient is a very pleasant 73 year old female with a history of hypertension as well as hyperlipidemia osteoarthritis and depression.  She presented to the hospital via EMS after a fall.  She complained of generalized pain.  ER work-up showed metabolic acidosis with urine positive for ketones and leukocytosis of an unknown source she also was hypothermic tachycardic and blood pressure was within normal range.  She was admitted for sepsis with which was initially suspected and was started on empiric antibiotics-blood cultures were negative chest x-ray showed no evidence of pneumonia and her leukocytosis resolved-sepsis-was ruled out and antibiotics were discontinued.  She was hyponatremic that was treated with IV fluids and she had confusion secondary to metabolic acidosis which was thought secondary to the diarrhea and dehydration she presented with.  She was given IV bicarb drip and potassium and phosphorus replacement and her mental status gradually improved.  She is here for PT and OT and has done quite well she will be going home early next week  She does live by herself but apparently does have a support system and she feels comfortable going home.    Vital signs appear to be stable nursing does not report any issues she appears to have done quite well during her stay here  Past Medical History:  Diagnosis Date  . Anemia    none since hysterectomy  . Arthritis    LlTHA 11'15(had a fall)-"remains tender"  . Depression   . Heart murmur    history of  . Hyperlipidemia   . Hypertension   . Stroke [facility])    4-5 yrs ago(left sided weakness) -no residual  . Transfusion history    age 29- "hysterectomy"         Past Surgical History:  Procedure Laterality Date  . ABDOMINAL HYSTERECTOMY    . COLONOSCOPY WITH  PROPOFOL N/A 12/25/2014   Procedure: COLONOSCOPY WITH PROPOFOL;  Surgeon: Charolett Bumpers, MD;  Location: [facility];  Service: Endoscopy;  Laterality: N/A;  . FRACTURE SURGERY  01/22/12   fx of eye socket from fall-"retained plate"  . HAND SURGERY Left    nerve release -many yrs ago  . HIP ARTHROPLASTY Left 09/16/2014   Procedure: ARTHROPLASTY BIPOLAR HIP;  Surgeon: Sheral Apley, MD;  Location: [facility] OR;  Service: Orthopedics;  Laterality: Left;         Allergies as of 10/02/2019      Reactions   Percodan [oxycodone-aspirin] Nausea Only   Oxycodone Nausea Only         Medication List       Accurate as of October 02, 2019  8:21 PM. If you have any questions, ask your nurse or doctor.        STOP taking these medications   acetaminophen 500 MG tablet Commonly known as: TYLENOL Stopped by: Merrilee Seashore, MD   ARIPiprazole 2 MG tablet Commonly known as: Abilify Stopped by: Merrilee Seashore, MD   ARIPiprazole 5 MG tablet Commonly known as: ABILIFY Stopped by: Merrilee Seashore, MD   aspirin EC 325 MG tablet Stopped by: Merrilee Seashore, MD   CALCIUM-VITAMIN D PO Stopped by: Merrilee Seashore, MD   carbidopa-levodopa 25-100 MG tablet Commonly known as: SINEMET IR Stopped by: Merrilee Seashore, MD   Melatonin 3-10 MG Tabs Stopped by: Merrilee Seashore, MD   Melatonin 5 MG Tabs Stopped by: Merrilee Seashore,  MD   multivitamin tablet Stopped by: Inocencio Homes, MD   multivitamin-lutein Caps capsule Stopped by: Inocencio Homes, MD   terbinafine 250 MG tablet Commonly known as: LAMISIL Stopped by: Inocencio Homes, MD     TAKE these medications   alendronate 70 MG tablet Commonly known as: FOSAMAX Take 70 mg by mouth once a week. Take with 8 oz of water 30 minutes prior to food/meds. Do not lie down for 30 minutes.   amLODipine 5 MG tablet Commonly known as: NORVASC Take 5 mg by mouth 2 (two) times daily.   atorvastatin 40 MG tablet  Commonly known as: LIPITOR Take 40 mg by mouth daily.   buPROPion 100 MG tablet Commonly known as: WELLBUTRIN Take 100 mg by mouth 2 (two) times daily. What changed: Another medication with the same name was removed. Continue taking this medication, and follow the directions you see here. Changed by: Inocencio Homes, MD   feeding supplement (PRO-STAT SUGAR FREE 64) Liqd Take 30 mLs by mouth daily.   gabapentin 300 MG capsule Commonly known as: NEURONTIN Take 300 mg by mouth 3 (three) times daily.   metoprolol tartrate 25 MG tablet Commonly known as: LOPRESSOR 25 mg 2 (two) times daily.   sertraline 100 MG tablet Commonly known as: ZOLOFT Take 100 mg by mouth daily. What changed: Another medication with the same name was removed. Continue taking this medication, and follow the directions you see here. Changed by: Inocencio Homes, MD   tiZANidine 2 MG tablet Commonly known as: ZANAFLEX Take 2 mg by mouth 2 (two) times daily. QAM and QHS   traZODone 50 MG tablet Commonly known as: DESYREL Take 50 mg by mouth 2 (two) times daily. QAM and QHS What changed: Another medication with the same name was removed. Continue taking this medication, and follow the directions you see here. Changed by: Inocencio Homes, MD       No orders of the defined types were placed in this encounter.       Immunization History  Administered Date(s) Administered  . Influenza, High Dose Seasonal PF 08/13/2017  . Zoster Recombinat (Shingrix) 07/12/2019    Social History        Tobacco Use  . Smoking status: Never Smoker  . Smokeless tobacco: Never Used  Substance Use Topics  . Alcohol use: Yes    Alcohol/week: 7.0 standard drinks    Types: 7 Cans of beer per week    Family history is        Family History  Problem Relation Age of Onset  . Hyperlipidemia Mother   . Hypertension Mother   . Diabetes Mother   . Heart disease Mother        aortic vavle   replaced  . Hypertension Father   . Hyperlipidemia Father   . Heart disease Father        aortic valve replaced  . Alzheimer's disease Father      Review of systems.  General no complaints of fever chills says she feels well.  Skin is not complaining of rashes or itching.  Head ears eyes nose mouth and throat is not complaining of visual changes or sore throat.  Respiratory denies shortness of breath or cough.  Cardiac is not complaining of any chest pain has trace lower extremity edema she describes as baseline.  GI is not complaining of abdominal discomfort nausea vomiting diarrhea constipation.  GU is not complaining of dysuria.  Musculoskeletal says she has gained strength she does use  a walker and feels she is doing well does not complain of pain.  Neurologic does not complain of dizziness headache numbness has gained strength.  And psych does have a significant history of depression but this appears to be very stable currently does not complain of being depressed nursing staff says she has done really well in this regard.  Physical exam.  Temperature 97.1 pulse 68 respirations 18 blood pressure 130/79.  In general this is a very pleasant elderly female in no distress sitting comfortably in her chair.  Her skin is warm and dry.  Eyes visual acuity it appears to be intact sclera and conjunctive are clear.  She does have prescription lenses.  Oropharynx is clear mucous membranes moist.  Chest is clear to auscultation there is no labored breathing.  Heart is regular rate and rhythm without murmur gallop or rub she has trace lower extremity edema.  Her abdomen is soft nontender with active bowel sounds.  Musculoskeletal is able to move all extremities x4 it appears at baseline she is now currently using a walker.  Neurologic is grossly intact no lateralizing findings her speech is clear  Psych she is very pleasant and appropriate alert and oriented     Labs.  October 09, 2019.  Sodium 145 potassium 4.6 BUN 11.6 creatinine 0.71.  October 03, 2019.  WBC 5.6 hemoglobin 11.2 platelets 372.  Assessment and plan.  1.  History of weakness-sepsis ruled out in hospital-at this point she appears to have done well with her therapy has gained strength she is looking forward to going home-would benefit from continued PT and OT.  2.  History of electrolyte abnormalities with at one point hyponatremia and then hypernatremia-this resolved with IV fluids and metabolic panels have shown stability in the facility will attempt to obtain an updated BMP before discharge but this appears to have stabilized.  3.   Acute Metabolic encephalopathy thought secondary to dehydration and electrolyte abnormalities this is resolved.  4.  History of depression this has been significant but well controlled here she is on trazodone 50 mg twice daily as well as Zoloft 100 mg a day in addition to Wellbutrin 100 mg twice daily.  5.-Hypertension she continues on metoprolol 25 mg twice daily and Norvasc 5 mg twice daily this appears stable recent blood pressures 130/79-123/56-  #6-history of neuropathy with chronic pain she is on Neurontin 300 mg 3 times daily she also has orders for Zanaflex 2 mg twice daily.  7.  History of hyperlipidemia not stated as uncontrolled she is on atorvastatin 40 mg a day.  Again she has done well here has gained strength she is looking forward to going home would benefit from therapy and she will need expedient follow-up with her primary care provider as well.  [id number]-of note greater than 30 minutes spent on this discharge summary-greater than 50% of time spent coordinating a plan of care for numerous diagnoses

## 2019-10-15 ENCOUNTER — Other Ambulatory Visit: Payer: Self-pay | Admitting: Internal Medicine

## 2019-10-18 ENCOUNTER — Ambulatory Visit: Payer: Medicare Other | Admitting: Physician Assistant

## 2019-10-20 ENCOUNTER — Ambulatory Visit: Payer: Medicare Other | Admitting: Physician Assistant

## 2019-10-30 ENCOUNTER — Emergency Department (HOSPITAL_BASED_OUTPATIENT_CLINIC_OR_DEPARTMENT_OTHER)
Admission: EM | Admit: 2019-10-30 | Discharge: 2019-10-30 | Disposition: A | Discharge status: Home / Self Care | Payer: Medicare Other | Attending: Emergency Medicine | Admitting: Emergency Medicine

## 2019-10-30 ENCOUNTER — Other Ambulatory Visit: Payer: Self-pay

## 2019-10-30 ENCOUNTER — Encounter (HOSPITAL_BASED_OUTPATIENT_CLINIC_OR_DEPARTMENT_OTHER): Payer: Self-pay

## 2019-10-30 DIAGNOSIS — Z79899 Other long term (current) drug therapy: Secondary | ICD-10-CM | POA: Insufficient documentation

## 2019-10-30 DIAGNOSIS — Z8673 Personal history of transient ischemic attack (TIA), and cerebral infarction without residual deficits: Secondary | ICD-10-CM | POA: Diagnosis not present

## 2019-10-30 DIAGNOSIS — I10 Essential (primary) hypertension: Secondary | ICD-10-CM | POA: Insufficient documentation

## 2019-10-30 DIAGNOSIS — K921 Melena: Secondary | ICD-10-CM | POA: Diagnosis present

## 2019-10-30 DIAGNOSIS — R195 Other fecal abnormalities: Secondary | ICD-10-CM | POA: Diagnosis not present

## 2019-10-30 LAB — COMPREHENSIVE METABOLIC PANEL
ALT: 12 U/L (ref 0–44)
AST: 18 U/L (ref 15–41)
Albumin: 4 g/dL (ref 3.5–5.0)
Alkaline Phosphatase: 72 U/L (ref 38–126)
Anion gap: 10 (ref 5–15)
BUN: 12 mg/dL (ref 8–23)
CO2: 23 mmol/L (ref 22–32)
Calcium: 9 mg/dL (ref 8.9–10.3)
Chloride: 106 mmol/L (ref 98–111)
Creatinine, Ser: 0.68 mg/dL (ref 0.44–1.00)
GFR calc Af Amer: >60 mL/min (ref >60)
GFR calc non Af Amer: >60 mL/min (ref >60)
Glucose, Bld: 100 mg/dL — ABNORMAL HIGH (ref 70–99)
Potassium: 3.7 mmol/L (ref 3.5–5.1)
Sodium: 139 mmol/L (ref 135–145)
Total Bilirubin: 0.7 mg/dL (ref 0.3–1.2)
Total Protein: 6.6 g/dL (ref 6.5–8.1)

## 2019-10-30 LAB — CBC WITH DIFFERENTIAL/PLATELET
Abs Immature Granulocytes: 0.01 10*3/uL (ref 0.00–0.07)
Basophils Absolute: 0 10*3/uL (ref 0.0–0.1)
Basophils Relative: 1 %
Eosinophils Absolute: 0.2 10*3/uL (ref 0.0–0.5)
Eosinophils Relative: 4 %
HCT: 34.1 % — ABNORMAL LOW (ref 36.0–46.0)
Hemoglobin: 11 g/dL — ABNORMAL LOW (ref 12.0–15.0)
Immature Granulocytes: 0 %
Lymphocytes Relative: 34 %
Lymphs Abs: 1.8 10*3/uL (ref 0.7–4.0)
MCH: 29.3 pg (ref 26.0–34.0)
MCHC: 32.3 g/dL (ref 30.0–36.0)
MCV: 90.7 fL (ref 80.0–100.0)
Monocytes Absolute: 0.6 10*3/uL (ref 0.1–1.0)
Monocytes Relative: 10 %
Neutro Abs: 2.7 10*3/uL (ref 1.7–7.7)
Neutrophils Relative %: 51 %
Platelets: 359 10*3/uL (ref 150–400)
RBC: 3.76 MIL/uL — ABNORMAL LOW (ref 3.87–5.11)
RDW: 13.4 % (ref 11.5–15.5)
WBC: 5.3 10*3/uL (ref 4.0–10.5)
nRBC: 0 % (ref 0.0–0.2)

## 2019-10-30 LAB — OCCULT BLOOD X 1 CARD TO LAB, STOOL: Fecal Occult Bld: NEGATIVE

## 2019-10-30 NOTE — Discharge Instructions (Addendum)
You were seen in the emergency department today for abnormally colored stools.  Your labs were overall reassuring.  There was no blood in your stool on the test that we ran.  We would like you to follow-up with your primary care provider and/or the gastroenterology doctor provided in your discharge instructions within 3 to 5 days.  Return to the emergency department for new or worsening symptoms including but not limited to bright red blood in your stool, abdominal pain, fever, lightheadedness, dizziness, passing out, or any other concerns.

## 2019-10-30 NOTE — ED Triage Notes (Signed)
Pt c/o "black stools" x 2 weeks-NAD-slow gait with own cane

## 2019-10-30 NOTE — ED Provider Notes (Signed)
Medical screening examination/treatment/procedure(s) were conducted as a shared visit with non-physician practitioner(s) and myself.  I personally evaluated the patient during the encounter.     Patient referred in by primary care doctor for evaluation of black stools for 2 weeks.  Work-up here no evidence of any significant anemia or change in hemoglobin.  Hemoccult test rectal stool was negative for blood.  Patient hemodynamically stable.  Other than have showing some evidence of hypertension.  Patient has a past history of hypertension hyperlipidemia prior stroke 4 to 5 years ago.  Patient stable for discharge home and follow-up with primary care doctor.  Patient's abdomen soft nontender.  Patient not tachycardic no fever.  Oxygen saturations in the upper 90s.   Fredia Sorrow, MD 10/30/19 1739

## 2019-10-30 NOTE — ED Provider Notes (Signed)
[facility] EMERGENCY DEPARTMENT Provider Note   CSN: [id number] Arrival date & time: 10/30/19  1537     History Chief Complaint  Patient presents with  . Melena    Dana [name] is a 73 y.o. female with a history of anemia, hypertension, hyperlipidemia, depression, and prior stroke who presents to the emergency department from her PCP office for evaluation of abnormal stools for the past 2 weeks.  Patient states that for the past 2 weeks her stools have been dark/black and sticky.  She states she is having 3-4 bowel movements per day which is increased from her baseline.  No alleviating or aggravating factors.  Seen by PCP who recommended ER evaluation.  Denies lightheadedness, dizziness, chest pain, dyspnea, syncope, abdominal pain, nausea, vomiting, fever, chills, recent antibiotics, or recent foreign travel.  She has not noted any bright red blood per rectum.  She takes a daily aspirin.  She does not take any blood thinners.  She has no recent new medications.  HPI     Past Medical History:  Diagnosis Date  . Anemia    none since hysterectomy  . Arthritis    LlTHA 11'15(had a fall)-"remains tender"  . Depression   . Heart murmur    history of  . Hyperlipidemia   . Hypertension   . Stroke [facility])    4-5 yrs ago(left sided weakness) -no residual  . Transfusion history    age 66- "hysterectomy"    Patient Active Problem List   Diagnosis Date Noted  . Metabolic acidosis 54/00/8676  . Acute encephalopathy 10/04/2019  . Hyponatremia 10/04/2019  . Hypokalemia 10/04/2019  . Hypernatremia 10/04/2019  . Polyneuropathy 10/04/2019  . Insomnia 09/07/2018  . Major depressive disorder 07/25/2018  . GAD (generalized anxiety disorder) 07/25/2018  . Hip fracture, left ([place]) 09/15/2014  . Hip fracture requiring operative repair ([place]) 09/15/2014  . Hip fracture ([place]) 09/15/2014  . Chronic back pain 09/15/2014  . History of stroke   . Hypertension   . Hyperlipidemia   .  Essential hypertension   . Spastic hemiplegia affecting nondominant side ([place]) 01/12/2012    Past Surgical History:  Procedure Laterality Date  . ABDOMINAL HYSTERECTOMY    . COLONOSCOPY WITH PROPOFOL N/A 12/25/2014   Procedure: COLONOSCOPY WITH PROPOFOL;  Surgeon: Garlan Fair, MD;  Location: [facility];  Service: Endoscopy;  Laterality: N/A;  . FRACTURE SURGERY  01/22/12   fx of eye socket from fall-"retained plate"  . HAND SURGERY Left    nerve release -many yrs ago  . HIP ARTHROPLASTY Left 09/16/2014   Procedure: ARTHROPLASTY BIPOLAR HIP;  Surgeon: Renette Butters, MD;  Location: [place];  Service: Orthopedics;  Laterality: Left;     OB History   No obstetric history on file.     Family History  Problem Relation Age of Onset  . Hyperlipidemia Mother   . Hypertension Mother   . Diabetes Mother   . Heart disease Mother        aortic vavle  replaced  . Hypertension Father   . Hyperlipidemia Father   . Heart disease Father        aortic valve replaced  . Alzheimer's disease Father     Social History   Tobacco Use  . Smoking status: Never Smoker  . Smokeless tobacco: Never Used  Substance Use Topics  . Alcohol use: Yes    Comment: hx of daily none since recent admn  . Drug use: Never    Home Medications  Prior to Admission medications   Medication Sig Start Date End Date Taking? Authorizing Provider  alendronate (FOSAMAX) 70 MG tablet Take 1 tablet (70 mg total) by mouth once a week. Take with 8 oz of water 30 minutes prior to food/meds. Do not lie down for 30 minutes. 10/13/19   Edmon Crape C, PA-C  Amino Acids-Protein Hydrolys (FEEDING SUPPLEMENT, PRO-STAT SUGAR FREE 64,) LIQD Take 30 mLs by mouth daily. 10/13/19   Edmon Crape C, PA-C  amLODipine (NORVASC) 5 MG tablet Take 1 tablet (5 mg total) by mouth 2 (two) times daily. 10/13/19   Edmon Crape C, PA-C  atorvastatin (LIPITOR) 40 MG tablet Take 1 tablet (40 mg total) by mouth daily. 10/13/19   Edmon Crape C,  PA-C  buPROPion (WELLBUTRIN) 100 MG tablet Take 1 tablet (100 mg total) by mouth 2 (two) times daily. 10/13/19   Edmon Crape C, PA-C  gabapentin (NEURONTIN) 300 MG capsule Take 1 capsule (300 mg total) by mouth 3 (three) times daily. 10/13/19   Edmon Crape C, PA-C  metoprolol tartrate (LOPRESSOR) 25 MG tablet Take 1 tablet (25 mg total) by mouth 2 (two) times daily. 10/13/19   Edmon Crape C, PA-C  sertraline (ZOLOFT) 100 MG tablet Take 1 tablet (100 mg total) by mouth daily. 10/13/19   Edmon Crape C, PA-C  tiZANidine (ZANAFLEX) 2 MG tablet Take 1 tablet (2 mg total) by mouth 2 (two) times daily. QAM and QHS 10/13/19   Lassen, Arlo C, PA-C  traZODone (DESYREL) 50 MG tablet Take 1 tablet (50 mg total) by mouth 2 (two) times daily. QAM and QHS 10/13/19   Edmon Crape C, PA-C    Allergies    Percodan [oxycodone-aspirin] and Oxycodone  Review of Systems   Review of Systems  Constitutional: Negative for chills and fever.  Respiratory: Negative for shortness of breath.   Cardiovascular: Negative for chest pain.  Gastrointestinal: Negative for abdominal pain, constipation, diarrhea, nausea and vomiting.       Positive for dark stools.  Neurological: Negative for dizziness, syncope, weakness, light-headedness and headaches.  All other systems reviewed and are negative.   Physical Exam Updated Vital Signs BP (!) 177/83 (BP Location: Right Arm)   Pulse 97   Temp 98.8 F (37.1 C) (Oral)   Resp 14   SpO2 98%   Physical Exam Vitals and nursing note reviewed. Exam conducted with a chaperone present.  Constitutional:      General: She is not in acute distress.    Appearance: She is well-developed. She is not toxic-appearing.  HENT:     Head: Normocephalic and atraumatic.  Eyes:     General:        Right eye: No discharge.        Left eye: No discharge.     Conjunctiva/sclera: Conjunctivae normal.  Cardiovascular:     Rate and Rhythm: Normal rate and regular rhythm.  Pulmonary:      Effort: Pulmonary effort is normal. No respiratory distress.     Breath sounds: Normal breath sounds. No wheezing, rhonchi or rales.  Abdominal:     General: There is no distension.     Palpations: Abdomen is soft.     Tenderness: There is no abdominal tenderness. There is no right CVA tenderness, left CVA tenderness, guarding or rebound.  Genitourinary:    Comments: Small nonthrombosed external hemorrhoids present.  DRE with early brown stool.  No bright red blood or obvious melena. Musculoskeletal:     Cervical back: Neck  supple.     Comments: Trace edema to the lower legs.  Skin:    General: Skin is warm and dry.     Findings: No rash.  Neurological:     Mental Status: She is alert.     Comments: Clear speech.   Psychiatric:        Behavior: Behavior normal.    ED Results / Procedures / Treatments   Labs (all labs ordered are listed, but only abnormal results are displayed) Labs Reviewed  CBC WITH DIFFERENTIAL/PLATELET - Abnormal; Notable for the following components:      Result Value   RBC 3.76 (*)    Hemoglobin 11.0 (*)    HCT 34.1 (*)    All other components within normal limits  OCCULT BLOOD X 1 CARD TO LAB, STOOL  COMPREHENSIVE METABOLIC PANEL    EKG None  Radiology No results found.  Procedures Procedures (including critical care time)  Medications Ordered in ED Medications - No data to display  ED Course  I have reviewed the triage vital signs and the nursing notes.  Pertinent labs & imaging results that were available during my care of the patient were reviewed by me and considered in my medical decision making (see chart for details).    MDM Rules/Calculators/A&P                      Patient presents to the emergency department with complaints of dark tarry/sticky stools for the past 2 weeks.  Patient is nontoxic-appearing, resting comfortably, vitals WNL with exception of elevated blood pressure, doubt HTN emergency.  Abdominal exam is benign,  nontender, no peritoneal signs.  DRE with brown stool, no melena or bright red blood--> fecal occult negative.  Labs with fairly stable hemoglobin and hematocrit, no significant change from prior.  No leukocytosis.  Platelets WNL.  CMP unremarkable.  Abdomen remains benign, do not suspect acute surgical process.  Unclear definitive etiology to patient's symptoms.  She appears appropriate for discharge from the emergency department with close PCP follow-up. I discussed results, treatment plan, need for follow-up, and return precautions with the patient. Provided opportunity for questions, patient confirmed understanding and is in agreement with plan.   Findings and plan of care discussed with supervising physician Dr. Deretha EmoryZackowski who has evaluated patient & is in agreement.   Final Clinical Impression(s) / ED Diagnoses Final diagnoses:  Abnormal stools    Rx / DC Orders ED Discharge Orders    None       Cherly Andersonetrucelli, Hiromi Knodel R, PA-C 10/30/19 1740    Vanetta MuldersZackowski, Scott, MD 11/02/19 [phone]

## 2019-11-09 ENCOUNTER — Emergency Department (HOSPITAL_COMMUNITY)
Admission: EM | Admit: 2019-11-09 | Discharge: 2019-11-09 | Disposition: A | Discharge status: Home / Self Care | Payer: Medicare Other | Attending: Emergency Medicine | Admitting: Emergency Medicine

## 2019-11-09 ENCOUNTER — Other Ambulatory Visit: Payer: Self-pay

## 2019-11-09 ENCOUNTER — Encounter (HOSPITAL_COMMUNITY): Payer: Self-pay | Admitting: Emergency Medicine

## 2019-11-09 ENCOUNTER — Emergency Department (HOSPITAL_COMMUNITY): Payer: Medicare Other

## 2019-11-09 DIAGNOSIS — S0101XA Laceration without foreign body of scalp, initial encounter: Secondary | ICD-10-CM | POA: Insufficient documentation

## 2019-11-09 DIAGNOSIS — Y939 Activity, unspecified: Secondary | ICD-10-CM | POA: Insufficient documentation

## 2019-11-09 DIAGNOSIS — W19XXXA Unspecified fall, initial encounter: Secondary | ICD-10-CM

## 2019-11-09 DIAGNOSIS — Y999 Unspecified external cause status: Secondary | ICD-10-CM | POA: Insufficient documentation

## 2019-11-09 DIAGNOSIS — Z79899 Other long term (current) drug therapy: Secondary | ICD-10-CM | POA: Diagnosis not present

## 2019-11-09 DIAGNOSIS — I10 Essential (primary) hypertension: Secondary | ICD-10-CM | POA: Insufficient documentation

## 2019-11-09 DIAGNOSIS — W01190A Fall on same level from slipping, tripping and stumbling with subsequent striking against furniture, initial encounter: Secondary | ICD-10-CM | POA: Diagnosis not present

## 2019-11-09 DIAGNOSIS — Z8673 Personal history of transient ischemic attack (TIA), and cerebral infarction without residual deficits: Secondary | ICD-10-CM | POA: Insufficient documentation

## 2019-11-09 DIAGNOSIS — S0990XA Unspecified injury of head, initial encounter: Secondary | ICD-10-CM | POA: Diagnosis present

## 2019-11-09 DIAGNOSIS — Y929 Unspecified place or not applicable: Secondary | ICD-10-CM | POA: Insufficient documentation

## 2019-11-09 MED ORDER — LIDOCAINE-EPINEPHRINE (PF) 2 %-1:200000 IJ SOLN
INTRAMUSCULAR | Status: AC
  Filled 2019-11-09: qty 20

## 2019-11-09 MED ORDER — ACETAMINOPHEN 500 MG PO TABS
1000.0000 mg | ORAL_TABLET | Freq: Once | ORAL | Status: AC
  Administered 2019-11-09: 1000 mg via ORAL
  Filled 2019-11-09: qty 2

## 2019-11-09 NOTE — ED Provider Notes (Signed)
[facility]-EMERGENCY DEPT Provider Note   CSN: [id number] Arrival date & time: 11/09/19  0229     History Chief Complaint  Patient presents with  . Fall  . Head Laceration    Dana [name] is a 73 y.o. female.  Patient with history of CVA, HTN, HLD, arthritis presents after a fall occurring just before arrival. She states she went to sit in a chair and missed the full seat, falling to the floor and hitting her right head against the dining room table. No LOC, nausea, vomiting. She is not anticoagulated. She denies other injury or pain. No recent illness, SOB, fever, diarrhea. No neck pain.  The history is provided by the patient. No language interpreter was used.  Fall Pertinent negatives include no chest pain, no abdominal pain, no headaches and no shortness of breath.  Head Laceration Pertinent negatives include no chest pain, no abdominal pain, no headaches and no shortness of breath.       Past Medical History:  Diagnosis Date  . Anemia    none since hysterectomy  . Arthritis    LlTHA 11'15(had a fall)-"remains tender"  . Depression   . Heart murmur    history of  . Hyperlipidemia   . Hypertension   . Stroke [facility])    4-5 yrs ago(left sided weakness) -no residual  . Transfusion history    age 18- "hysterectomy"    Patient Active Problem List   Diagnosis Date Noted  . Metabolic acidosis 10/04/2019  . Acute encephalopathy 10/04/2019  . Hyponatremia 10/04/2019  . Hypokalemia 10/04/2019  . Hypernatremia 10/04/2019  . Polyneuropathy 10/04/2019  . Insomnia 09/07/2018  . Major depressive disorder 07/25/2018  . GAD (generalized anxiety disorder) 07/25/2018  . Hip fracture, left (HCC) 09/15/2014  . Hip fracture requiring operative repair (HCC) 09/15/2014  . Hip fracture (HCC) 09/15/2014  . Chronic back pain 09/15/2014  . History of stroke   . Hypertension   . Hyperlipidemia   . Essential hypertension   . Spastic hemiplegia affecting  nondominant side (HCC) 01/12/2012    Past Surgical History:  Procedure Laterality Date  . ABDOMINAL HYSTERECTOMY    . COLONOSCOPY WITH PROPOFOL N/A 12/25/2014   Procedure: COLONOSCOPY WITH PROPOFOL;  Surgeon: Charolett Bumpers, MD;  Location: [facility] END[facility]COPY;  Service: Endoscopy;  Laterality: N/A;  . FRACTURE SURGERY  01/22/12   fx of eye socket from fall-"retained plate"  . HAND SURGERY Left    nerve release -many yrs ago  . HIP ARTHROPLASTY Left 09/16/2014   Procedure: ARTHROPLASTY BIPOLAR HIP;  Surgeon: Sheral Apley, MD;  Location: [facility];  Service: Orthopedics;  Laterality: Left;     OB History   No obstetric history on file.     Family History  Problem Relation Age of Onset  . Hyperlipidemia Mother   . Hypertension Mother   . Diabetes Mother   . Heart disease Mother        aortic vavle  replaced  . Hypertension Father   . Hyperlipidemia Father   . Heart disease Father        aortic valve replaced  . Alzheimer's disease Father     Social History   Tobacco Use  . Smoking status: Never Smoker  . Smokeless tobacco: Never Used  Substance Use Topics  . Alcohol use: Yes    Comment: hx of daily none since recent admn  . Drug use: Never    Home Medications Prior to Admission medications   Medication Sig  Start Date End Date Taking? Authorizing Provider  alendronate (FOSAMAX) 70 MG tablet Take 1 tablet (70 mg total) by mouth once a week. Take with 8 oz of water 30 minutes prior to food/meds. Do not lie down for 30 minutes. 10/13/19   Granville Lewis C, PA-C  Amino Acids-Protein Hydrolys (FEEDING SUPPLEMENT, PRO-STAT SUGAR FREE 64,) LIQD Take 30 mLs by mouth daily. 10/13/19   Granville Lewis C, PA-C  amLODipine (NORVASC) 5 MG tablet Take 1 tablet (5 mg total) by mouth 2 (two) times daily. 10/13/19   Granville Lewis C, PA-C  atorvastatin (LIPITOR) 40 MG tablet Take 1 tablet (40 mg total) by mouth daily. 10/13/19   Granville Lewis C, PA-C  buPROPion (WELLBUTRIN) 100 MG tablet Take 1 tablet  (100 mg total) by mouth 2 (two) times daily. 10/13/19   Granville Lewis C, PA-C  gabapentin (NEURONTIN) 300 MG capsule Take 1 capsule (300 mg total) by mouth 3 (three) times daily. 10/13/19   Granville Lewis C, PA-C  metoprolol tartrate (LOPRESSOR) 25 MG tablet Take 1 tablet (25 mg total) by mouth 2 (two) times daily. 10/13/19   Granville Lewis C, PA-C  sertraline (ZOLOFT) 100 MG tablet Take 1 tablet (100 mg total) by mouth daily. 10/13/19   Granville Lewis C, PA-C  tiZANidine (ZANAFLEX) 2 MG tablet Take 1 tablet (2 mg total) by mouth 2 (two) times daily. QAM and QHS 10/13/19   Lassen, Arlo C, PA-C  traZODone (DESYREL) 50 MG tablet Take 1 tablet (50 mg total) by mouth 2 (two) times daily. QAM and QHS 10/13/19   Granville Lewis C, PA-C    Allergies    Percodan [oxycodone-aspirin] and Oxycodone  Review of Systems   Review of Systems  Constitutional: Negative for chills and fever.  HENT: Negative.  Negative for facial swelling.   Respiratory: Negative.  Negative for shortness of breath.   Cardiovascular: Negative.  Negative for chest pain.  Gastrointestinal: Negative.  Negative for abdominal pain, nausea and vomiting.  Musculoskeletal: Negative.  Negative for back pain and neck pain.  Skin: Positive for wound (Right scalp).  Neurological: Negative.  Negative for syncope, weakness and headaches.  Psychiatric/Behavioral: Negative for confusion.    Physical Exam Updated Vital Signs BP (!) 147/68 (BP Location: Left Arm)   Pulse 65   Temp 97.9 F (36.6 C) (Oral)   Resp 19   SpO2 98%   Physical Exam Constitutional:      General: She is not in acute distress.    Appearance: She is well-developed. She is not ill-appearing.  HENT:     Head:     Comments: Laceration to right temporal scalp without significant hematoma. No hemotympanum. No facial injury or tenderness.  Pulmonary:     Effort: Pulmonary effort is normal.  Chest:     Chest wall: No tenderness.  Abdominal:     Tenderness: There is no abdominal  tenderness.  Musculoskeletal:        General: Normal range of motion.     Cervical back: Normal range of motion.     Comments: No limitation of movement of extremities. No deformities. No midline or paracervical tenderness.   Skin:    General: Skin is warm and dry.  Neurological:     Mental Status: She is alert and oriented to person, place, and time.     Sensory: No sensory deficit.     ED Results / Procedures / Treatments   Labs (all labs ordered are listed, but only abnormal results are  displayed) Labs Reviewed - No data to display  EKG None  Radiology No results found.  Procedures .Marland Kitchen.Laceration Repair  Date/Time: 11/09/2019 4:09 AM Performed by: Elpidio AnisUpstill, Aliani Caccavale, PA-C Authorized by: Elpidio AnisUpstill, Tinisha Etzkorn, PA-C   Consent:    Consent obtained:  Verbal   Consent given by:  Patient Anesthesia (see MAR for exact dosages):    Anesthesia method:  Local infiltration   Local anesthetic:  Lidocaine 2% WITH epi Laceration details:    Location:  Scalp   Scalp location:  R temporal   Length (cm):  2 Repair type:    Repair type:  Simple Exploration:    Hemostasis achieved with:  Direct pressure   Wound extent: no foreign bodies/material noted     Contaminated: no   Treatment:    Area cleansed with:  Saline   Amount of cleaning:  Standard Skin repair:    Repair method:  Staples   Number of staples:  2 Approximation:    Approximation:  Close Post-procedure details:    Dressing:  Open (no dressing)   (including critical care time)  Medications Ordered in ED Medications - No data to display  ED Course  I have reviewed the triage vital signs and the nursing notes.  Pertinent labs & imaging results that were available during my care of the patient were reviewed by me and considered in my medical decision making (see chart for details).    MDM Rules/Calculators/A&P                      Patient to ED With scalp laceration after mechanical fall from chair to floor. No LOC,  nausea. She is fully oriented and alert and considered a reliable historian.   No neurologic deficits on exam. Injury felt isolated to laceration. Head CT pending for full evaluation. Lac repaired as per above note. Anticipate discharge home.   Head CT negative for acute findings. No mental status changes during ED encounter. She is felt appropriate for discharge home.   Final Clinical Impression(s) / ED Diagnoses Final diagnoses:  None   1. Mechanical fall 2. Scalp laceration  Rx / DC Orders ED Discharge Orders    None       Elpidio AnisUpstill, Francesa Eugenio, Cordelia Poche-C 11/09/19 [id number]    Mesner, Barbara CowerJason, MD 11/10/19 0000

## 2019-11-09 NOTE — Discharge Instructions (Addendum)
Please call your primary care doctor to let her know of your fall with scalp injury tonight. The staples will need to be removed in 10 days by Dr. Drema Dallas. Please go to be re-evaluated with any concerning symptoms - fever, significant headache, drainage from the wound.   Return to the ED as needed for emergency care.

## 2019-11-09 NOTE — ED Triage Notes (Addendum)
Pt from home report to EMS that she was attempting to sit ina chair when it flip over landing on her . Has laceration right crown above ear bleeding controlled. dENIES loc

## 2019-11-09 NOTE — ED Provider Notes (Signed)
Medical screening examination/treatment/procedure(s) were conducted as a shared visit with non-physician practitioner(s) and myself.  I personally evaluated the patient during the encounter.  Patient with mechanical fall and subsequent head lac, repaired as per PA note. Plan for CT scan of head. No other obvious injuries. Mental status intact. Likely discharge if no injury.         Karlis Cregg, Corene Cornea, MD 11/10/19 0000

## 2019-11-17 ENCOUNTER — Other Ambulatory Visit: Payer: Self-pay | Admitting: Internal Medicine

## 2019-11-23 DIAGNOSIS — R259 Unspecified abnormal involuntary movements: Secondary | ICD-10-CM | POA: Insufficient documentation

## 2019-11-23 DIAGNOSIS — W19XXXA Unspecified fall, initial encounter: Secondary | ICD-10-CM | POA: Insufficient documentation

## 2019-11-23 DIAGNOSIS — R29818 Other symptoms and signs involving the nervous system: Secondary | ICD-10-CM | POA: Insufficient documentation

## 2019-11-23 DIAGNOSIS — Y92009 Unspecified place in unspecified non-institutional (private) residence as the place of occurrence of the external cause: Secondary | ICD-10-CM | POA: Insufficient documentation

## 2019-11-23 DIAGNOSIS — R627 Adult failure to thrive: Secondary | ICD-10-CM | POA: Insufficient documentation

## 2019-11-23 DIAGNOSIS — F423 Hoarding disorder: Secondary | ICD-10-CM | POA: Insufficient documentation

## 2019-11-27 ENCOUNTER — Encounter: Payer: Self-pay | Admitting: Physician Assistant

## 2019-11-27 ENCOUNTER — Ambulatory Visit (INDEPENDENT_AMBULATORY_CARE_PROVIDER_SITE_OTHER): Payer: Medicare Other | Admitting: Physician Assistant

## 2019-11-27 DIAGNOSIS — F331 Major depressive disorder, recurrent, moderate: Secondary | ICD-10-CM

## 2019-11-27 DIAGNOSIS — G4700 Insomnia, unspecified: Secondary | ICD-10-CM | POA: Diagnosis not present

## 2019-11-27 MED ORDER — BUPROPION HCL ER (XL) 150 MG PO TB24: 450.0000 mg | ORAL_TABLET | Freq: Every day | ORAL | 1 refills | Status: DC

## 2019-11-27 MED ORDER — SERTRALINE HCL 100 MG PO TABS: 200.0000 mg | ORAL_TABLET | Freq: Every day | ORAL | 1 refills | Status: DC

## 2019-11-27 MED ORDER — TRAZODONE HCL 50 MG PO TABS: 50.0000 mg | ORAL_TABLET | Freq: Every evening | ORAL | 1 refills | Status: DC | PRN

## 2019-11-27 MED ORDER — ARIPIPRAZOLE 10 MG PO TABS: 10.0000 mg | ORAL_TABLET | Freq: Every day | ORAL | 1 refills | Status: DC

## 2019-11-27 NOTE — Progress Notes (Signed)
[facility]  Patient ID: Dana [name],  MRN: [id number]  PCP: Juluis Rainier, MD  Date of Evaluation: 11/27/2019 Time spent:30 minutes  Chief Complaint:  Chief Complaint    Depression; Follow-up      HISTORY/CURRENT STATUS: HPI For routine med check.   See the changes in medical history documented below.  Patient states she is doing well under the circumstances.  "I would like to say I was great, but I am not."  She has had a bit more depression, with lack of motivation and energy.  It is hard to know whether that is related to her mental health or her physical problems right now.  It is likely a combination of both.  She does read for pleasure and that is about all she is able to do.  Continues to live alone.  She denies suicidal or homicidal thoughts.  Patient denies increased energy with decreased need for sleep, no increased talkativeness, no racing thoughts, no impulsivity or risky behaviors, no increased spending, no increased libido, no grandiosity.  She sleeps okay as long as she has the trazodone.  She does not take it every night and usually just needs 50 mg.  She does not feel this is related to the falls.  They can happen at any time of day.  States she does not wake up in the middle of the night, and get up and then fall.  Denies dizziness, syncope, seizures, numbness, tingling, tics, slurred speech, confusion. Denies muscle or joint pain, stiffness, or dystonia.  Individual Medical History/ Review of Systems: Changes? :Yes  was in hospital for 2 weeks in Nov. Had sepsis. Has had several falls. Also was in hospital overnight last week, d/t falls. No serious injuries.  She's been in rehab but at home now, getting home health care PT.  She has been instructed to use her walker, not her cane.  Allergies: Percodan [oxycodone-aspirin] and Oxycodone  Current Medications:  Current Outpatient Medications:  .  alendronate (FOSAMAX) 70 MG tablet, Take 1 tablet (70  mg total) by mouth once a week. Take with 8 oz of water 30 minutes prior to food/meds. Do not lie down for 30 minutes., Disp: 4 tablet, Rfl: 0 .  Amino Acids-Protein Hydrolys (FEEDING SUPPLEMENT, PRO-STAT SUGAR FREE 64,) LIQD, Take 30 mLs by mouth daily., Disp: 887 mL, Rfl: 0 .  amLODipine (NORVASC) 5 MG tablet, Take 1 tablet (5 mg total) by mouth 2 (two) times daily., Disp: 60 tablet, Rfl: 0 .  atorvastatin (LIPITOR) 40 MG tablet, Take 1 tablet (40 mg total) by mouth daily., Disp: 30 tablet, Rfl: 0 .  buPROPion (WELLBUTRIN XL) 150 MG 24 hr tablet, Take 3 tablets (450 mg total) by mouth daily., Disp: 270 tablet, Rfl: 1 .  gabapentin (NEURONTIN) 300 MG capsule, Take 1 capsule (300 mg total) by mouth 3 (three) times daily., Disp: 90 capsule, Rfl: 0 .  metoprolol tartrate (LOPRESSOR) 25 MG tablet, Take 1 tablet (25 mg total) by mouth 2 (two) times daily., Disp: 60 tablet, Rfl: 0 .  sertraline (ZOLOFT) 100 MG tablet, Take 2 tablets (200 mg total) by mouth daily., Disp: 180 tablet, Rfl: 1 .  tiZANidine (ZANAFLEX) 2 MG tablet, Take 1 tablet (2 mg total) by mouth 2 (two) times daily. QAM and QHS, Disp: 60 tablet, Rfl: 0 .  traZODone (DESYREL) 50 MG tablet, Take 1-2 tablets (50-100 mg total) by mouth at bedtime as needed for sleep., Disp: 180 tablet, Rfl: 1 .  ARIPiprazole (ABILIFY) 10  MG tablet, Take 1 tablet (10 mg total) by mouth daily., Disp: 30 tablet, Rfl: 1 Medication Side Effects: none  Family Medical/ Social History: Changes? No  MENTAL HEALTH EXAM:  There were no vitals taken for this visit.There is no height or weight on file to calculate BMI.  General Appearance: Unable to assess  Eye Contact:  Unable to assess  Speech:  Clear and Coherent  Volume:  Normal  Mood:  Euthymic  Affect:  Unable to assess  Thought Process:  Goal Directed and Descriptions of Associations: Intact  Orientation:  Full (Time, Place, and Person)  Thought Content: Logical   Suicidal Thoughts:  No  Homicidal  Thoughts:  No  Memory:  WNL  Judgement:  Good  Insight:  Good  Psychomotor Activity:  Unable to assess  Concentration:  Concentration: Good  Recall:  Good  Fund of Knowledge: Good  Language: Good  Assets:  Desire for Improvement  ADL's:  Intact  Cognition: WNL  Prognosis:  Good    DIAGNOSES:    ICD-10-CM   1. Major depressive disorder, recurrent episode, moderate (HCC)  F33.1   2. Insomnia, unspecified type  G47.00     Receiving Psychotherapy: No    RECOMMENDATIONS:  I spent 30 minutes with her, reviewing hospital records as well as discussing treatment options.  We need to change the medicine to help with the depression. She is already at the maximum dose of the Wellbutrin so it is not an option to increase that.  She is also at maximum dose on the Zoloft, although we could increase it slightly if needed since she has responded well to Abilify, we will increase that.  She understands and would like to try that. Increase Abilify to 10 mg. Continue Wellbutrin XL 150 mg, 3 p.o. daily Continue gabapentin 300 mg, 1 p.o. 3 times daily. Continue Zoloft 100 mg, 2 p.o. daily. Continue trazodone 50 mg, 1-2 nightly as needed sleep. Return in 4 to 6 weeks.  Donnal Moat, PA-C

## 2019-11-28 ENCOUNTER — Encounter: Payer: Self-pay | Admitting: Physician Assistant

## 2020-01-02 ENCOUNTER — Encounter: Payer: Self-pay | Admitting: Physician Assistant

## 2020-01-02 ENCOUNTER — Ambulatory Visit (INDEPENDENT_AMBULATORY_CARE_PROVIDER_SITE_OTHER): Payer: Medicare Other | Admitting: Physician Assistant

## 2020-01-02 DIAGNOSIS — G4700 Insomnia, unspecified: Secondary | ICD-10-CM | POA: Diagnosis not present

## 2020-01-02 DIAGNOSIS — F331 Major depressive disorder, recurrent, moderate: Secondary | ICD-10-CM | POA: Diagnosis not present

## 2020-01-02 MED ORDER — ARIPIPRAZOLE 10 MG PO TABS: 10.0000 mg | ORAL_TABLET | Freq: Every day | ORAL | 1 refills | Status: DC

## 2020-01-02 NOTE — Progress Notes (Signed)
[facility] Check  Patient ID: Dana [name],  MRN: [id number]  PCP: Dana Ruff, MD  Date of Evaluation: 01/02/2020 Time spent:20 minutes  Chief Complaint:  Chief Complaint    Depression; Anxiety; Medication Refill     Virtual Visit via Telephone Note  I connected with patient by a video enabled telemedicine application or telephone, with their informed consent, and verified patient privacy and that I am speaking with the correct person using two identifiers.  I am private, in my office and the patient is home.  Dana [name] does not have a computer and is unable to do a video visit.  I discussed the limitations, risks, security and privacy concerns of performing an evaluation and management service by telephone and the availability of in person appointments. I also discussed with the patient that there may be a patient responsible charge related to this service. The patient expressed understanding and agreed to proceed.   I discussed the assessment and treatment plan with the patient. The patient was provided an opportunity to ask questions and all were answered. The patient agreed with the plan and demonstrated an understanding of the instructions.   The patient was advised to call back or seek an in-person evaluation if the symptoms worsen or if the condition fails to improve as anticipated.  I provided 20 minutes of non-face-to-face time during this encounter.  HISTORY/CURRENT STATUS: HPI For routine f/u.  At [place], we increased the Abilify.  She states she feels a little better.  "I can see a glimmer of improvement."  She feels that it will continue to improve.  She does enjoy reading but is not able to do much else.  She is not driving currently and has not since October.  She had several falls in November and December which led to hospitalizations which then led to a stay at rehab.  She is still getting home health care rehab.  She is slowly gaining her strength back.  Due to  these physical problems, she does not have a lot of energy.  She does not cry easily.  Denies suicidal or homicidal thoughts.  She is sleeping pretty good.  Reports no recent falls in the past month.  Tremor is about the same.  She does see a neurologist and is on Sinemet.  Denies dizziness, no chronic muscle problems, she does have joint pain secondary to osteoarthritis.  Individual Medical History/ Review of Systems: Changes? :No    Past medications for mental health diagnoses include: Zoloft, Prozac, Wellbutrin, trazodone, Abilify  Allergies: Percodan [oxycodone-aspirin] and Oxycodone  Current Medications:  Current Outpatient Medications:  .  alendronate (FOSAMAX) 70 MG tablet, Take 1 tablet (70 mg total) by mouth once a week. Take with 8 oz of water 30 minutes prior to food/meds. Do not lie down for 30 minutes., Disp: 4 tablet, Rfl: 0 .  amLODipine (NORVASC) 5 MG tablet, Take 1 tablet (5 mg total) by mouth 2 (two) times daily., Disp: 60 tablet, Rfl: 0 .  ARIPiprazole (ABILIFY) 10 MG tablet, Take 1 tablet (10 mg total) by mouth daily., Disp: 90 tablet, Rfl: 1 .  atorvastatin (LIPITOR) 40 MG tablet, Take 1 tablet (40 mg total) by mouth daily., Disp: 30 tablet, Rfl: 0 .  buPROPion (WELLBUTRIN XL) 150 MG 24 hr tablet, Take 3 tablets (450 mg total) by mouth daily., Disp: 270 tablet, Rfl: 1 .  carbidopa-levodopa (SINEMET IR) 25-100 MG tablet, Take by mouth., Disp: , Rfl:  .  Cholecalciferol (VITAMIN D) 50 MCG (2000  UT) CAPS, Take by mouth., Disp: , Rfl:  .  gabapentin (NEURONTIN) 300 MG capsule, Take 1 capsule (300 mg total) by mouth 3 (three) times daily., Disp: 90 capsule, Rfl: 0 .  Melatonin 5 MG TABS, Take 5 mg by mouth at bedtime as needed., Disp: , Rfl:  .  metoprolol tartrate (LOPRESSOR) 25 MG tablet, Take 1 tablet (25 mg total) by mouth 2 (two) times daily., Disp: 60 tablet, Rfl: 0 .  Multiple Vitamin (MULTIVITAMIN) tablet, Take 1 tablet by mouth daily., Disp: , Rfl:  .  sertraline  (ZOLOFT) 100 MG tablet, Take 2 tablets (200 mg total) by mouth daily., Disp: 180 tablet, Rfl: 1 .  tiZANidine (ZANAFLEX) 2 MG tablet, Take 1 tablet (2 mg total) by mouth 2 (two) times daily. QAM and QHS, Disp: 60 tablet, Rfl: 0 .  traZODone (DESYREL) 50 MG tablet, Take 1-2 tablets (50-100 mg total) by mouth at bedtime as needed for sleep., Disp: 180 tablet, Rfl: 1 .  Amino Acids-Protein Hydrolys (FEEDING SUPPLEMENT, PRO-STAT SUGAR FREE 64,) LIQD, Take 30 mLs by mouth daily. (Patient not taking: Reported on 01/02/2020), Disp: 887 mL, Rfl: 0 Medication Side Effects: none  Family Medical/ Social History: Changes? No  MENTAL HEALTH EXAM:  There were no vitals taken for this visit.There is no height or weight on file to calculate BMI.  General Appearance: Unable to assess  Eye Contact:  Unable to assess  Speech:  Clear and Coherent  Volume:  Normal  Mood:  Euthymic  Affect:  Appropriate  Thought Process:  Goal Directed and Descriptions of Associations: Intact  Orientation:  Full (Time, Place, and Person)  Thought Content: Logical   Suicidal Thoughts:  No  Homicidal Thoughts:  No  Memory:  WNL  Judgement:  Good  Insight:  Good  Psychomotor Activity:  Unable to assess  Concentration:  Concentration: Good  Recall:  Good  Fund of Knowledge: Good  Language: Good  Assets:  Desire for Improvement  ADL's:  Intact  Cognition: WNL  Prognosis:  Good    DIAGNOSES:    ICD-10-CM   1. Major depressive disorder, recurrent episode, moderate (HCC)  F33.1   2. Insomnia, unspecified type  G47.00     Receiving Psychotherapy: No    RECOMMENDATIONS:  PDMP was reviewed. I spent 20 minutes with her. Continue Abilify 10 mg daily. Continue Wellbutrin XL 150 mg, 3 p.o. daily. Continue gabapentin 300 mg, 1 p.o. 3 times daily. Continue melatonin 5 mg nightly as needed. Continue Zoloft 100 mg, 2 p.o. daily. Continue trazodone 50 mg, 1-2 nightly as needed sleep. Return in 2 to 3 months.  Dana Overly, PA-C

## 2020-01-05 ENCOUNTER — Ambulatory Visit: Payer: Medicare Other | Attending: Internal Medicine

## 2020-01-05 DIAGNOSIS — Z23 Encounter for immunization: Secondary | ICD-10-CM | POA: Insufficient documentation

## 2020-01-05 NOTE — Progress Notes (Signed)
   Covid-19 Vaccination Clinic  Name:  Dana [name]    MRN: [id number] DOB: [birth year]  01/05/2020  [name] was observed post Covid-19 immunization for 15 minutes without incidence. She was provided with Vaccine Information Sheet and instruction to access the V-Safe system.   [name] was instructed to call 911 with any severe reactions post vaccine: Marland Kitchen Difficulty breathing  . Swelling of your face and throat  . A fast heartbeat  . A bad rash all over your body  . Dizziness and weakness    Immunizations Administered    Name Date Dose VIS Date Route   Pfizer COVID-19 Vaccine 01/05/2020  4:35 PM 0.3 mL 10/20/2019 Intramuscular   Manufacturer: [employer], [employer]   Lot: [id number]   NDC: [address]-7

## 2020-01-10 ENCOUNTER — Telehealth: Payer: Self-pay | Admitting: Adult Health

## 2020-01-10 NOTE — Telephone Encounter (Signed)
Pt is aware of her upcoming appointment on 04-22 with Shanda Bumps, NP.  Pt states she has not been allowed to drive since October.  Pt is asking is asking for a call to discuss her driving privileges.

## 2020-01-10 NOTE — Telephone Encounter (Signed)
I called pt and LMVM for her to return call.   

## 2020-01-11 NOTE — Telephone Encounter (Signed)
I called pt.  She is not driving per Dr. Zachery Dauer, pcp after her having 2 falls in 11/20 and 12/20.  Has been having PT HH since then by [facility]. She stated they have said she is doing well and can drive.  Dr. Zachery Dauer deferred driving to neuro.  I mover her appt to 01-22-20 at 1015 arrive 0945.  Seh appreciated call.

## 2020-01-22 ENCOUNTER — Other Ambulatory Visit: Payer: Self-pay

## 2020-01-22 ENCOUNTER — Ambulatory Visit: Payer: Medicare Other | Admitting: Adult Health

## 2020-01-22 ENCOUNTER — Encounter: Payer: Self-pay | Admitting: Adult Health

## 2020-01-22 VITALS — BP 120/70 | HR 77 | Temp 97.6°F | Ht 63.0 in | Wt 113.0 lb

## 2020-01-22 DIAGNOSIS — I10 Essential (primary) hypertension: Secondary | ICD-10-CM | POA: Diagnosis not present

## 2020-01-22 DIAGNOSIS — Z8673 Personal history of transient ischemic attack (TIA), and cerebral infarction without residual deficits: Secondary | ICD-10-CM

## 2020-01-22 DIAGNOSIS — G20 Parkinson's disease: Secondary | ICD-10-CM | POA: Diagnosis not present

## 2020-01-22 DIAGNOSIS — E785 Hyperlipidemia, unspecified: Secondary | ICD-10-CM

## 2020-01-22 MED ORDER — CARBIDOPA-LEVODOPA 25-100 MG PO TABS: 1.0000 | ORAL_TABLET | Freq: Three times a day (TID) | ORAL | 3 refills | Status: DC

## 2020-01-22 NOTE — Patient Instructions (Signed)
Your Plan:  Continue Sinemet 1 tab 3 times daily - refill placed  Continue to work with home health therapies for ongoing improvement  Graduated return to driving as recommended.  It is recommended that you first drive with another licensed driver in an empty parking lot. If you do well with this, you can drive on a quiet street with the licensed driver.  If you do well with this, you can drive on a busy street with a licensed driver.  If you continue to do well, you can be cleared to drive independently.  For the first month after resuming driving, it is recommend no nighttime, busy/heavy traffic roads or Interstate driving. Any concerns prior to driving such as not feeling well, weakness and off balance, please refrain from driving at that time   Follow up in 6 months or call earlier if needed       Thank you for coming to see [place] at [facility]. I hope we have been able to provide you high quality care today.  You may receive a patient satisfaction survey over the next few weeks. We would appreciate your feedback and comments so that we may continue to improve ourselves and the health of our patients.

## 2020-01-22 NOTE — Progress Notes (Signed)
[facility] [address] [place]. [place] [address] [phone]       OFFICE FOLLOW UP NOTE  Dana. Dana [name] Date of Birth:  [birth year] Medical Record Number:  [id number]   Referring MD/PCP:  Juluis Rainier Reason for Referral: Parkinsonian tremors [facility] provider: Dr. Pearlean Brownie  Chief complaint: Chief Complaint  Patient presents with  . Follow-up    pt alone, rm 9, pt here for follow up and her primary MD wanted neurologist to give ok driving.       HPI:   Dana [name] is a 74 year old female who is being seen today, 01/22/2020, for follow-up regarding likely Parkinson's disease with tremor concerns.  Has been stable with Sinemet with ongoing benefit taking 1 tablet 3 times daily for management of tremors.  Continues to have rest>action tremors but is able to maintain ADLs without difficulty.  She continues to live independently maintaining all ADLs majority of IADLs without difficulty.  Since prior visit in 02/2019, she has had multiple ED admissions due to reoccurring mechanical falls and even had short stay in SNF for rehab needs due to deconditioning and generalized weakness. Most recent ED admission 11/23/2019.  All work-up and imaging unremarkable during ED admissions.  Per patient, she was told by her PCP that she was unable to drive back in 76/1607 due to increased falls and is being seen today for possible driving clearance.  She denies any reoccurring falls since January and has been participating in home health therapy which she believes has greatly improved her overall gait and balance.  Continues to use a cane but will use Rollator walker for long distance.  No concerns regarding cognition, or memory loss.  Prior history of pontine stroke and continues on aspirin and atorvastatin for secondary stroke prevention.  No further concerns at this time.       History provided for reference purposes only Virtual visit 02/28/2019 JM: Dana [name] is a 74 y.o. female  who has been followed in this office for parkinsonian tremor.  She was initially scheduled for face-to-face follow-up office visit today at this time but due to [facility] office visit rescheduled for non-face-to-face telephone visit. Tremors continue to be stable with use of Sinemet 1 tab 3 times daily.  She takes 1 upon awaking (7-8am), 1 tab at noon and 1 tab late afternoon. She continues to be able to perform activities and ADLs without difficulty.  Continues to ambulate with cane without any recent falls.  At prior visit, loculated irregular heartbeat and referred to cardiology for potential cardiac monitor and further work-up.  48-hour cardiac monitor negative for concerning arrhythmias.  No further concerns at this time.  Interval history 11/22/2018 JM: Patient is being seen today for a follow-up visit.  Patient states improvement of her tremors since initiating Sinemet and is currently taking 1 tablet by mouth 3 times daily.  She is able to perform activities and ADLs at this time such as holding a cup of coffee or bathing.  She is tolerating Sinemet well without any reported side effects.  She continues to use a cane for ambulation and denies any recent falls.  No further concerns at this time.  Update 08/02/2018 PS: She returns for follow-up after last visit 2 months ago.  She states she has noticed some improvement in her tremors since starting Sinemet.  She is yet taking only 25/100 mg half a tablet 3 times daily.  She is tolerating it well without nausea, diarrhea, dizziness, sleepiness, hallucinations or delusions.  She still uses a cane to walk and states she has had no falls or injuries.  She did undergo MRI scan of the brain on 06/02/2018 which I personally reviewed which shows remote age right pontine infarct and age-appropriate changes of chronic microvascular ischemia and generalized cerebral atrophy.  Lab work done on 05/26/2018 shows normal vitamin B12, thyroid hormone, CBC and  comprehensive metabolic panel.  She denies any drooling of saliva bradykinesia.  She has no new complaints.  Initial visit 05/26/2018 PS;Dana [name] is a pleasant 74 year old Caucasian lady who is referred to see me today for hand tremor which is had for 4 years. She states that the tremor is been progressive increasing particularly in the last year or so. Is present mostly at rest but she notices it with activities like holding her hands up holding a cup of coffee. She also noted at times that she has been dropping objects with her right hand. She does notice slight tremor in the left hand as well but is not as bothersome. The patient has not noticed any changes in her voice or excessive drooling of saliva. She does admit that she is getting over all slower than needs more time to wear her clothes. She is also walking with a stooped posture and states that she's been falling a lot recently. She is a to major falls and fractured her finger as well as her hip. She denies tripping but states she falls without warning and cannot stop herself. She did not lose consciousness. She does have a remote history of a right pontine infarct in 2012 and had mild residual left-sided weakness that she still has that she had learned to compensate well for that. She uses a cane to ambulate but states this is mostly due to her hip surgery which did not produce anticipated benefit.She does have family history of tremor in her father but denies a definite history of Parkinson's disease.     ROS:   14 system review of systems is positive for tremor and gait impairment and all other systems negative  PMH:  Past Medical History:  Diagnosis Date  . Anemia    none since hysterectomy  . Arthritis    LlTHA 11'15(had a fall)-"remains tender"  . Depression   . Heart murmur    history of  . Hyperlipidemia   . Hypertension   . Stroke [facility])    4-5 yrs ago(left sided weakness) -no residual  . Transfusion history    age 58-  "hysterectomy"    Social History:  Social History   Socioeconomic History  . Marital status: Widowed    Spouse name: Not on file  . Number of children: Not on file  . Years of education: Not on file  . Highest education level: Not on file  Occupational History  . Not on file  Tobacco Use  . Smoking status: Never Smoker  . Smokeless tobacco: Never Used  Substance and Sexual Activity  . Alcohol use: Yes    Comment: hx of daily none since recent admn  . Drug use: Never  . Sexual activity: Not on file  Other Topics Concern  . Not on file  Social History Narrative   Widowed.    Social Determinants of Health   Financial Resource Strain:   . Difficulty of Paying Living Expenses:   Food Insecurity:   . Worried About Programme researcher, broadcasting/film/video in the Last Year:   . [employer] of Food in the Last Year:  Transportation Needs:   . Film/video editor (Medical):   Marland Kitchen Lack of Transportation (Non-Medical):   Physical Activity:   . Days of Exercise per Week:   . Minutes of Exercise per Session:   Stress:   . Feeling of Stress :   Social Connections:   . Frequency of Communication with Friends and Family:   . Frequency of Social Gatherings with Friends and Family:   . Attends Religious Services:   . Active Member of Clubs or Organizations:   . Attends Archivist Meetings:   Marland Kitchen Marital Status:   Intimate Partner Violence:   . Fear of Current or Ex-Partner:   . Emotionally Abused:   Marland Kitchen Physically Abused:   . Sexually Abused:     Medications:   Current Outpatient Medications on File Prior to Visit  Medication Sig Dispense Refill  . alendronate (FOSAMAX) 70 MG tablet Take 1 tablet (70 mg total) by mouth once a week. Take with 8 oz of water 30 minutes prior to food/meds. Do not lie down for 30 minutes. 4 tablet 0  . Amino Acids-Protein Hydrolys (FEEDING SUPPLEMENT, PRO-STAT SUGAR FREE 64,) LIQD Take 30 mLs by mouth daily. (Patient not taking: Reported on 01/02/2020) 887 mL 0  .  amLODipine (NORVASC) 5 MG tablet Take 1 tablet (5 mg total) by mouth 2 (two) times daily. 60 tablet 0  . ARIPiprazole (ABILIFY) 10 MG tablet Take 1 tablet (10 mg total) by mouth daily. 90 tablet 1  . atorvastatin (LIPITOR) 40 MG tablet Take 1 tablet (40 mg total) by mouth daily. 30 tablet 0  . buPROPion (WELLBUTRIN XL) 150 MG 24 hr tablet Take 3 tablets (450 mg total) by mouth daily. 270 tablet 1  . carbidopa-levodopa (SINEMET IR) 25-100 MG tablet Take by mouth.    . Cholecalciferol (VITAMIN D) 50 MCG (2000 UT) CAPS Take by mouth.    . gabapentin (NEURONTIN) 300 MG capsule Take 1 capsule (300 mg total) by mouth 3 (three) times daily. 90 capsule 0  . Melatonin 5 MG TABS Take 5 mg by mouth at bedtime as needed.    . metoprolol tartrate (LOPRESSOR) 25 MG tablet Take 1 tablet (25 mg total) by mouth 2 (two) times daily. 60 tablet 0  . Multiple Vitamin (MULTIVITAMIN) tablet Take 1 tablet by mouth daily.    . sertraline (ZOLOFT) 100 MG tablet Take 2 tablets (200 mg total) by mouth daily. 180 tablet 1  . tiZANidine (ZANAFLEX) 2 MG tablet Take 1 tablet (2 mg total) by mouth 2 (two) times daily. QAM and QHS 60 tablet 0  . traZODone (DESYREL) 50 MG tablet Take 1-2 tablets (50-100 mg total) by mouth at bedtime as needed for sleep. 180 tablet 1   No current facility-administered medications on file prior to visit.    Allergies:   Allergies  Allergen Reactions  . Percodan [Oxycodone-Aspirin] Nausea Only  . Oxycodone Nausea Only    Physical Exam General: Frail elderly Caucasian lady seated, in no evident distress Head: head normocephalic and atraumatic.   Neck: supple with no carotid or supraclavicular bruits Cardiovascular: Normal rate and rhythm.  No murmurs Musculoskeletal: no deformity Skin:  no rash/petichiae Vascular:  Normal pulses all extremities  Neurologic Exam Mental Status: Awake and fully alert.  Normal speech and language.  Oriented to place and time. Recent and remote memory intact.  Attention span, concentration and fund of knowledge appropriate. Mood and affect appropriate. Recall: 2/3, 3/3 after prompting; serial addition normal; animal naming 10  in 60 seconds  Cranial Nerves: Pupils equal, briskly reactive to light. Extraocular movements full without nystagmus. Visual fields full to confrontation. Hearing diminished slightly bilaterally. Facial sensation intact. Face, tongue, palate moves normally and symmetrically.  Motor: Mild weakness of left grip and intrinsic hand muscles -stable. Mild weakness of left ankle dorsiflexors and hip flexors -stable. Resting at times pill-rolling tremor of the right upper extremity. Mild intermittent action tremor R>L upper extremities. Mild cogwheel rigidity right. Mild bradykinesia. Rapid alternating movements are diminished right more than left.   Sensory.: intact to touch , pinprick , position and vibratory sensation.  Coordination: Rapid alternating movements decreased RUE.  Gait and Station: Arises from chair without great difficulty. Stance is slightly hunched. Gait demonstrates mildly shuffled steps but no evidence of imbalance and use of cane.  Limited arm swing left arm but no evidence of pill-rolling tremor.  Unable to assess right arm due to use of cane. Reflexes: 1+ and symmetric. Toes downgoing.       ASSESSMENT: 74 year old Caucasian lady with 6 year history of progressive upper extremity tremor and gait/balance difficulties and frequent falls likely due to Parkinson's disease. Remote history of right pontine lacunar infarct due to small vessel disease with mild residual left hemiparesis.  Patient is being seen today for follow-up visit of tremors which have improved after initiation of Sinemet.  Also requesting clearance to return to driving as previous difficulty with increasing falls     PLAN: -Continue Sinemet 1 tab 3 times daily for ongoing benefit of tremors likely related to Parkinson's disease -Prior increased falls  likely due to overall deconditioning, lack of physical exercise and sedentary lifestyle.  Greatly improved with HH therapies and has not had any falls in the past 2 months.  No cognitive or memory concerns during today's visit.  Cleared to return to driving from a neurological standpoint has no concerns appreciated during today's visit on a graduated return to driving basis which was discussed with patient.  Also advised to refrain from driving on days that she may not feel well, worsening gait or weakness.  Previously told to stop driving by PCP in 83/1517 and was advised to follow-up with [place] regarding clearance of return to driving.  Any questions or concerns regarding this clearance by PCP, please do not hesitate to reach out for further discussion -Continue aspirin and Lipitor for secondary stroke prevention  Follow-up in 6 months or call earlier if needed  I spent 33 minutes of face-to-face and non-face-to-face time with patient.  This included previsit chart review, lab review, study review, order entry, electronic health record documentation, patient education   Ihor Austin, [facility]  [facility] [address] [place], [place] [address]  Phone [phone] Fax [phone] Note: This document was prepared with digital dictation and possible smart phrase technology. Any transcriptional errors that result from this process are unintentional.

## 2020-01-31 ENCOUNTER — Ambulatory Visit: Payer: Medicare Other | Attending: Internal Medicine

## 2020-01-31 DIAGNOSIS — Z23 Encounter for immunization: Secondary | ICD-10-CM

## 2020-01-31 NOTE — Progress Notes (Signed)
   Covid-19 Vaccination Clinic  Name:  Dana [name]    MRN: [id number] DOB: 09-15-1946  01/31/2020  [name] was observed post Covid-19 immunization for 15 minutes without incident. She was provided with Vaccine Information Sheet and instruction to access the V-Safe system.   [name] was instructed to call 911 with any severe reactions post vaccine: Marland Kitchen Difficulty breathing  . Swelling of face and throat  . A fast heartbeat  . A bad rash all over body  . Dizziness and weakness   Immunizations Administered    Name Date Dose VIS Date Route   Pfizer COVID-19 Vaccine 01/31/2020  9:09 AM 0.3 mL 10/20/2019 Intramuscular   Manufacturer: [employer], [employer]   Lot: [id number]   NDC: [address]-5

## 2020-02-12 NOTE — Progress Notes (Signed)
I agree with the above plan 

## 2020-02-29 ENCOUNTER — Ambulatory Visit: Payer: Self-pay | Admitting: Adult Health

## 2020-03-15 ENCOUNTER — Ambulatory Visit: Payer: Medicare Other | Admitting: Physician Assistant

## 2020-03-20 ENCOUNTER — Encounter: Payer: Self-pay | Admitting: Physician Assistant

## 2020-03-20 ENCOUNTER — Ambulatory Visit (INDEPENDENT_AMBULATORY_CARE_PROVIDER_SITE_OTHER): Payer: Medicare Other | Admitting: Physician Assistant

## 2020-03-20 ENCOUNTER — Other Ambulatory Visit: Payer: Self-pay

## 2020-03-20 DIAGNOSIS — G4700 Insomnia, unspecified: Secondary | ICD-10-CM

## 2020-03-20 DIAGNOSIS — R251 Tremor, unspecified: Secondary | ICD-10-CM

## 2020-03-20 DIAGNOSIS — F331 Major depressive disorder, recurrent, moderate: Secondary | ICD-10-CM | POA: Diagnosis not present

## 2020-03-20 NOTE — Progress Notes (Signed)
[facility] Med Check  Patient ID: Dana [name],  MRN: [id number]  PCP: Juluis Rainier, MD  Date of Evaluation: 03/20/2020 Time spent:20 minutes  Chief Complaint:  Chief Complaint    Follow-up      HISTORY/CURRENT STATUS: HPI for routine 87-month med check.  Patient states she is doing okay.  She is now using her cane almost all the time.  She has not had any more falls since the winter.  She continues to live alone.  States she is able to do the things that she has to do.  Feels that her medications at the current doses are working well.  She is able to enjoy some things.  She loves reading and is now reading more often.  She is able to focus and concentrate on what she is reading.  Not isolating.  Not crying easily.  No suicidal or homicidal thoughts.  As long as she has the trazodone, states she is sleeping well.  Without it, she is unable to sleep much at all.  Denies dizziness, syncope, seizures, numbness, tingling, tremor, tics, slurred speech, confusion.   Individual Medical History/ Review of Systems: Changes? :No    Past medications for mental health diagnoses include: Zoloft, Prozac, Wellbutrin, trazodone, Abilify  Allergies: Percodan [oxycodone-aspirin] and Oxycodone  Current Medications:  Current Outpatient Medications:  .  alendronate (FOSAMAX) 70 MG tablet, Take 1 tablet (70 mg total) by mouth once a week. Take with 8 oz of water 30 minutes prior to food/meds. Do not lie down for 30 minutes., Disp: 4 tablet, Rfl: 0 .  amLODipine (NORVASC) 5 MG tablet, Take 1 tablet (5 mg total) by mouth 2 (two) times daily., Disp: 60 tablet, Rfl: 0 .  ARIPiprazole (ABILIFY) 10 MG tablet, Take 1 tablet (10 mg total) by mouth daily., Disp: 90 tablet, Rfl: 1 .  aspirin 81 MG chewable tablet, Chew 81 mg by mouth daily., Disp: , Rfl:  .  atorvastatin (LIPITOR) 40 MG tablet, Take 1 tablet (40 mg total) by mouth daily., Disp: 30 tablet, Rfl: 0 .  buPROPion (WELLBUTRIN XL) 150 MG 24  hr tablet, Take 3 tablets (450 mg total) by mouth daily., Disp: 270 tablet, Rfl: 1 .  carbidopa-levodopa (SINEMET IR) 25-100 MG tablet, Take 1 tablet by mouth 3 (three) times daily., Disp: 270 tablet, Rfl: 3 .  Cholecalciferol (VITAMIN D) 50 MCG (2000 UT) CAPS, Take by mouth., Disp: , Rfl:  .  gabapentin (NEURONTIN) 300 MG capsule, Take 1 capsule (300 mg total) by mouth 3 (three) times daily., Disp: 90 capsule, Rfl: 0 .  Melatonin 5 MG TABS, Take 5 mg by mouth at bedtime as needed., Disp: , Rfl:  .  metoprolol tartrate (LOPRESSOR) 25 MG tablet, Take 1 tablet (25 mg total) by mouth 2 (two) times daily., Disp: 60 tablet, Rfl: 0 .  Multiple Vitamin (MULTIVITAMIN) tablet, Take 1 tablet by mouth daily., Disp: , Rfl:  .  sertraline (ZOLOFT) 100 MG tablet, Take 2 tablets (200 mg total) by mouth daily., Disp: 180 tablet, Rfl: 1 .  tiZANidine (ZANAFLEX) 2 MG tablet, Take 1 tablet (2 mg total) by mouth 2 (two) times daily. QAM and QHS, Disp: 60 tablet, Rfl: 0 .  traZODone (DESYREL) 50 MG tablet, Take 1-2 tablets (50-100 mg total) by mouth at bedtime as needed for sleep., Disp: 180 tablet, Rfl: 1 Medication Side Effects: none  Family Medical/ Social History: Changes? No  MENTAL HEALTH EXAM:  There were no vitals taken for this visit.There is no height  or weight on file to calculate BMI.  General Appearance: Casual, Neat and Well Groomed  Eye Contact:  Good  Speech:  Clear and Coherent and Normal Rate  Volume:  Normal  Mood:  Euthymic  Affect:  Appropriate  Thought Process:  Goal Directed and Descriptions of Associations: Intact  Orientation:  Full (Time, Place, and Person)  Thought Content: Logical   Suicidal Thoughts:  No  Homicidal Thoughts:  No  Memory:  WNL  Judgement:  Good  Insight:  Good  Psychomotor Activity:  Walks with a cane very slowly and has tremor in her right hand more so than the left, at rest.  Concentration:  Concentration: Good  Recall:  Good  Fund of Knowledge: Good   Language: Good  Assets:  Desire for Improvement  ADL's:  Intact  Cognition: WNL  Prognosis:  Good    DIAGNOSES:    ICD-10-CM   1. Moderate episode of recurrent major depressive disorder (HCC)  F33.1   2. Insomnia, unspecified type  G47.00   3. Tremor  R25.1     Receiving Psychotherapy: No    RECOMMENDATIONS: PDMP was reviewed. I spent 20 minutes with her. Continue Abilify 10 mg 1 p.o. every morning. Continue Wellbutrin XL 150 mg, 3 p.o. daily. Continue Gabapentin 300 mg, 1 p.o. 3 times daily. Continue Zoloft 100 mg, 2 p.o. every morning. Continue trazodone 50 mg, 1-2 nightly as needed sleep. Return in 3 months.  Donnal Moat, PA-C

## 2020-03-26 ENCOUNTER — Emergency Department (HOSPITAL_COMMUNITY): Payer: Medicare Other

## 2020-03-26 ENCOUNTER — Other Ambulatory Visit: Payer: Self-pay

## 2020-03-26 ENCOUNTER — Emergency Department (HOSPITAL_COMMUNITY)
Admission: EM | Admit: 2020-03-26 | Discharge: 2020-03-26 | Disposition: A | Discharge status: Home / Self Care | Payer: Medicare Other | Source: Home / Self Care | Attending: Emergency Medicine | Admitting: Emergency Medicine

## 2020-03-26 ENCOUNTER — Encounter (HOSPITAL_COMMUNITY): Payer: Self-pay

## 2020-03-26 DIAGNOSIS — M25512 Pain in left shoulder: Secondary | ICD-10-CM | POA: Insufficient documentation

## 2020-03-26 DIAGNOSIS — S4992XA Unspecified injury of left shoulder and upper arm, initial encounter: Secondary | ICD-10-CM | POA: Insufficient documentation

## 2020-03-26 DIAGNOSIS — Y9389 Activity, other specified: Secondary | ICD-10-CM | POA: Insufficient documentation

## 2020-03-26 DIAGNOSIS — Y929 Unspecified place or not applicable: Secondary | ICD-10-CM | POA: Insufficient documentation

## 2020-03-26 DIAGNOSIS — S12031A Nondisplaced posterior arch fracture of first cervical vertebra, initial encounter for closed fracture: Secondary | ICD-10-CM | POA: Insufficient documentation

## 2020-03-26 DIAGNOSIS — Y999 Unspecified external cause status: Secondary | ICD-10-CM | POA: Insufficient documentation

## 2020-03-26 DIAGNOSIS — W010XXA Fall on same level from slipping, tripping and stumbling without subsequent striking against object, initial encounter: Secondary | ICD-10-CM | POA: Insufficient documentation

## 2020-03-26 DIAGNOSIS — S72115A Nondisplaced fracture of greater trochanter of left femur, initial encounter for closed fracture: Secondary | ICD-10-CM | POA: Diagnosis not present

## 2020-03-26 NOTE — ED Provider Notes (Signed)
[facility]-EMERGENCY DEPT Provider Note   CSN: [id number] Arrival date & time: 03/26/20  1452     History No chief complaint on file.   Dana [name] is a 74 y.o. female.  74 year old female who is here for mechanical fall just prior to arrival.  Patient states that she does use a walker to ambulate and that she lost her balance and fell onto her left shoulder.  No loss of consciousness.  Complains of mild lower cervical neck pain without radicular symptoms.  Notes left sharp shoulder pain is worse with any movement.  Denies any distal numbness or tingling.  No back chest or abdominal discomfort.  Denies any pain below the hips.        Past Medical History:  Diagnosis Date  . Anemia    none since hysterectomy  . Arthritis    LlTHA 11'15(had a fall)-"remains tender"  . Depression   . Heart murmur    history of  . Hyperlipidemia   . Hypertension   . Stroke [facility])    4-5 yrs ago(left sided weakness) -no residual  . Transfusion history    age 61- "hysterectomy"    Patient Active Problem List   Diagnosis Date Noted  . Metabolic acidosis 10/04/2019  . Acute encephalopathy 10/04/2019  . Hyponatremia 10/04/2019  . Hypokalemia 10/04/2019  . Hypernatremia 10/04/2019  . Polyneuropathy 10/04/2019  . Insomnia 09/07/2018  . Major depressive disorder 07/25/2018  . GAD (generalized anxiety disorder) 07/25/2018  . Hip fracture, left (HCC) 09/15/2014  . Hip fracture requiring operative repair (HCC) 09/15/2014  . Hip fracture (HCC) 09/15/2014  . Chronic back pain 09/15/2014  . History of stroke   . Hypertension   . Hyperlipidemia   . Essential hypertension   . Spastic hemiplegia affecting nondominant side (HCC) 01/12/2012    Past Surgical History:  Procedure Laterality Date  . ABDOMINAL HYSTERECTOMY    . COLONOSCOPY WITH PROPOFOL N/A 12/25/2014   Procedure: COLONOSCOPY WITH PROPOFOL;  Surgeon: Charolett Bumpers, MD;  Location: [facility];  Service:  Endoscopy;  Laterality: N/A;  . FRACTURE SURGERY  01/22/12   fx of eye socket from fall-"retained plate"  . HAND SURGERY Left    nerve release -many yrs ago  . HIP ARTHROPLASTY Left 09/16/2014   Procedure: ARTHROPLASTY BIPOLAR HIP;  Surgeon: Sheral Apley, MD;  Location: [facility] OR;  Service: Orthopedics;  Laterality: Left;     OB History   No obstetric history on file.     Family History  Problem Relation Age of Onset  . Hyperlipidemia Mother   . Hypertension Mother   . Diabetes Mother   . Heart disease Mother        aortic vavle  replaced  . Hypertension Father   . Hyperlipidemia Father   . Heart disease Father        aortic valve replaced  . Alzheimer's disease Father     Social History   Tobacco Use  . Smoking status: Never Smoker  . Smokeless tobacco: Never Used  Substance Use Topics  . Alcohol use: Yes    Comment: occasional beer  . Drug use: Never    Home Medications Prior to Admission medications   Medication Sig Start Date End Date Taking? Authorizing Provider  alendronate (FOSAMAX) 70 MG tablet Take 1 tablet (70 mg total) by mouth once a week. Take with 8 oz of water 30 minutes prior to food/meds. Do not lie down for 30 minutes. 10/13/19   Edmon Crape  C, PA-C  amLODipine (NORVASC) 5 MG tablet Take 1 tablet (5 mg total) by mouth 2 (two) times daily. 10/13/19   Edmon Crape C, PA-C  ARIPiprazole (ABILIFY) 10 MG tablet Take 1 tablet (10 mg total) by mouth daily. 01/02/20   Cherie Ouch, PA-C  aspirin 81 MG chewable tablet Chew 81 mg by mouth daily.    [provider]  atorvastatin (LIPITOR) 40 MG tablet Take 1 tablet (40 mg total) by mouth daily. 10/13/19   Edmon Crape C, PA-C  buPROPion (WELLBUTRIN XL) 150 MG 24 hr tablet Take 3 tablets (450 mg total) by mouth daily. 11/27/19   Melony Overly T, PA-C  carbidopa-levodopa (SINEMET IR) 25-100 MG tablet Take 1 tablet by mouth 3 (three) times daily. 01/22/20   Ihor Austin, NP  Cholecalciferol (VITAMIN D) 50  MCG (2000 UT) CAPS Take by mouth.    [provider]  gabapentin (NEURONTIN) 300 MG capsule Take 1 capsule (300 mg total) by mouth 3 (three) times daily. 10/13/19   Roena Malady, PA-C  Melatonin 5 MG TABS Take 5 mg by mouth at bedtime as needed.    [provider]  metoprolol tartrate (LOPRESSOR) 25 MG tablet Take 1 tablet (25 mg total) by mouth 2 (two) times daily. 10/13/19   Edmon Crape C, PA-C  Multiple Vitamin (MULTIVITAMIN) tablet Take 1 tablet by mouth daily.    [provider]  sertraline (ZOLOFT) 100 MG tablet Take 2 tablets (200 mg total) by mouth daily. 11/27/19   Melony Overly T, PA-C  tiZANidine (ZANAFLEX) 2 MG tablet Take 1 tablet (2 mg total) by mouth 2 (two) times daily. QAM and QHS 10/13/19   Lassen, Arlo C, PA-C  traZODone (DESYREL) 50 MG tablet Take 1-2 tablets (50-100 mg total) by mouth at bedtime as needed for sleep. 11/27/19   Cherie Ouch, PA-C    Allergies    Percodan [oxycodone-aspirin] and Oxycodone  Review of Systems   Review of Systems  All other systems reviewed and are negative.   Physical Exam Updated Vital Signs There were no vitals taken for this visit.  Physical Exam Vitals and nursing note reviewed.  Constitutional:      General: She is not in acute distress.    Appearance: Normal appearance. She is well-developed. She is not toxic-appearing.  HENT:     Head: Normocephalic and atraumatic.  Eyes:     General: Lids are normal.     Conjunctiva/sclera: Conjunctivae normal.     Pupils: Pupils are equal, round, and reactive to light.  Neck:     Thyroid: No thyroid mass.     Trachea: No tracheal deviation.   Cardiovascular:     Rate and Rhythm: Normal rate and regular rhythm.     Heart sounds: Normal heart sounds. No murmur. No gallop.   Pulmonary:     Effort: Pulmonary effort is normal. No respiratory distress.     Breath sounds: Normal breath sounds. No stridor. No decreased breath sounds, wheezing, rhonchi or rales.    Abdominal:     General: Bowel sounds are normal. There is no distension.     Palpations: Abdomen is soft.     Tenderness: There is no abdominal tenderness. There is no rebound.  Musculoskeletal:        General: Normal range of motion.     Left upper arm: Tenderness present. No swelling or bony tenderness.     Cervical back: Normal range of motion and neck supple.  Skin:  General: Skin is warm and dry.     Findings: No abrasion or rash.  Neurological:     Mental Status: She is alert and oriented to person, place, and time.     GCS: GCS eye subscore is 4. GCS verbal subscore is 5. GCS motor subscore is 6.     Cranial Nerves: No cranial nerve deficit.     Sensory: No sensory deficit.     Motor: Tremor present.  Psychiatric:        Speech: Speech normal.        Behavior: Behavior normal.     ED Results / Procedures / Treatments   Labs (all labs ordered are listed, but only abnormal results are displayed) Labs Reviewed - No data to display  EKG None  Radiology No results found.  Procedures Procedures (including critical care time)  Medications Ordered in ED Medications - No data to display  ED Course  I have reviewed the triage vital signs and the nursing notes.  Pertinent labs & imaging results that were available during my care of the patient were reviewed by me and considered in my medical decision making (see chart for details).    MDM Rules/Calculators/A&P                      Patient states that she lost her balance and fell today.  She denies any syncope associated with this.  X-ray of her shoulder negative per radiology and CT of her C-spine shows a nondisplaced fracture.  Discussed with neurosurgery on-call and they have requested an Aspen collar and they will see her in the office.  Patient comfortable with this. Final Clinical Impression(s) / ED Diagnoses Final diagnoses:  None    Rx / DC Orders ED Discharge Orders    None       Lacretia Leigh,  MD 03/26/20 [phone]

## 2020-03-26 NOTE — ED Triage Notes (Signed)
Patient BIBA from after syncopal episode. Patient reports falling in bathroom, complaining of L shoulder and L leg pain. Has swelling of R arm from previous fall. EMS reports pt was hypotensive on arrival at 90/50  BP 112/58 P 72 96% RA CBG 129  500 cc Bolus given

## 2020-03-27 ENCOUNTER — Encounter (HOSPITAL_COMMUNITY): Payer: Self-pay | Admitting: Emergency Medicine

## 2020-03-27 ENCOUNTER — Inpatient Hospital Stay (HOSPITAL_COMMUNITY)
Admission: EM | Admit: 2020-03-27 | Discharge: 2020-04-02 | DRG: 536 | Disposition: A | Discharge status: Skilled Nursing Facility | Payer: Medicare Other | Attending: Family Medicine | Admitting: Family Medicine

## 2020-03-27 ENCOUNTER — Emergency Department (HOSPITAL_COMMUNITY): Payer: Medicare Other

## 2020-03-27 ENCOUNTER — Inpatient Hospital Stay (HOSPITAL_COMMUNITY): Payer: Medicare Other

## 2020-03-27 DIAGNOSIS — Z7982 Long term (current) use of aspirin: Secondary | ICD-10-CM | POA: Diagnosis not present

## 2020-03-27 DIAGNOSIS — Z66 Do not resuscitate: Secondary | ICD-10-CM | POA: Diagnosis present

## 2020-03-27 DIAGNOSIS — M978XXA Periprosthetic fracture around other internal prosthetic joint, initial encounter: Secondary | ICD-10-CM

## 2020-03-27 DIAGNOSIS — Z885 Allergy status to narcotic agent status: Secondary | ICD-10-CM | POA: Diagnosis not present

## 2020-03-27 DIAGNOSIS — F411 Generalized anxiety disorder: Secondary | ICD-10-CM | POA: Diagnosis present

## 2020-03-27 DIAGNOSIS — Z20822 Contact with and (suspected) exposure to covid-19: Secondary | ICD-10-CM | POA: Diagnosis present

## 2020-03-27 DIAGNOSIS — F329 Major depressive disorder, single episode, unspecified: Secondary | ICD-10-CM | POA: Diagnosis present

## 2020-03-27 DIAGNOSIS — S12091A Other nondisplaced fracture of first cervical vertebra, initial encounter for closed fracture: Secondary | ICD-10-CM | POA: Diagnosis present

## 2020-03-27 DIAGNOSIS — Z7983 Long term (current) use of bisphosphonates: Secondary | ICD-10-CM

## 2020-03-27 DIAGNOSIS — I69354 Hemiplegia and hemiparesis following cerebral infarction affecting left non-dominant side: Secondary | ICD-10-CM | POA: Diagnosis not present

## 2020-03-27 DIAGNOSIS — Y92012 Bathroom of single-family (private) house as the place of occurrence of the external cause: Secondary | ICD-10-CM | POA: Diagnosis not present

## 2020-03-27 DIAGNOSIS — Z886 Allergy status to analgesic agent status: Secondary | ICD-10-CM

## 2020-03-27 DIAGNOSIS — Z79899 Other long term (current) drug therapy: Secondary | ICD-10-CM | POA: Diagnosis not present

## 2020-03-27 DIAGNOSIS — M810 Age-related osteoporosis without current pathological fracture: Secondary | ICD-10-CM | POA: Diagnosis present

## 2020-03-27 DIAGNOSIS — Z01818 Encounter for other preprocedural examination: Secondary | ICD-10-CM

## 2020-03-27 DIAGNOSIS — G20 Parkinson's disease: Secondary | ICD-10-CM | POA: Diagnosis present

## 2020-03-27 DIAGNOSIS — M9702XA Periprosthetic fracture around internal prosthetic left hip joint, initial encounter: Secondary | ICD-10-CM | POA: Diagnosis present

## 2020-03-27 DIAGNOSIS — R296 Repeated falls: Secondary | ICD-10-CM | POA: Diagnosis present

## 2020-03-27 DIAGNOSIS — Z96649 Presence of unspecified artificial hip joint: Secondary | ICD-10-CM

## 2020-03-27 DIAGNOSIS — Z90710 Acquired absence of both cervix and uterus: Secondary | ICD-10-CM | POA: Diagnosis not present

## 2020-03-27 DIAGNOSIS — W1830XA Fall on same level, unspecified, initial encounter: Secondary | ICD-10-CM | POA: Diagnosis present

## 2020-03-27 DIAGNOSIS — S72115A Nondisplaced fracture of greater trochanter of left femur, initial encounter for closed fracture: Principal | ICD-10-CM | POA: Diagnosis present

## 2020-03-27 DIAGNOSIS — M25512 Pain in left shoulder: Secondary | ICD-10-CM | POA: Diagnosis present

## 2020-03-27 DIAGNOSIS — I10 Essential (primary) hypertension: Secondary | ICD-10-CM | POA: Diagnosis present

## 2020-03-27 DIAGNOSIS — E785 Hyperlipidemia, unspecified: Secondary | ICD-10-CM | POA: Diagnosis present

## 2020-03-27 DIAGNOSIS — W19XXXA Unspecified fall, initial encounter: Secondary | ICD-10-CM | POA: Diagnosis present

## 2020-03-27 LAB — CBC WITH DIFFERENTIAL/PLATELET
Abs Immature Granulocytes: 0.03 10*3/uL (ref 0.00–0.07)
Basophils Absolute: 0 10*3/uL (ref 0.0–0.1)
Basophils Relative: 0 %
Eosinophils Absolute: 0.2 10*3/uL (ref 0.0–0.5)
Eosinophils Relative: 3 %
HCT: 33.7 % — ABNORMAL LOW (ref 36.0–46.0)
Hemoglobin: 10.8 g/dL — ABNORMAL LOW (ref 12.0–15.0)
Immature Granulocytes: 0 %
Lymphocytes Relative: 16 %
Lymphs Abs: 1.2 10*3/uL (ref 0.7–4.0)
MCH: 28.8 pg (ref 26.0–34.0)
MCHC: 32 g/dL (ref 30.0–36.0)
MCV: 89.9 fL (ref 80.0–100.0)
Monocytes Absolute: 0.7 10*3/uL (ref 0.1–1.0)
Monocytes Relative: 9 %
Neutro Abs: 5.4 10*3/uL (ref 1.7–7.7)
Neutrophils Relative %: 72 %
Platelets: 315 10*3/uL (ref 150–400)
RBC: 3.75 MIL/uL — ABNORMAL LOW (ref 3.87–5.11)
RDW: 13.7 % (ref 11.5–15.5)
WBC: 7.5 10*3/uL (ref 4.0–10.5)
nRBC: 0 % (ref 0.0–0.2)

## 2020-03-27 LAB — BASIC METABOLIC PANEL
Anion gap: 10 (ref 5–15)
BUN: 21 mg/dL (ref 8–23)
CO2: 26 mmol/L (ref 22–32)
Calcium: 9.6 mg/dL (ref 8.9–10.3)
Chloride: 105 mmol/L (ref 98–111)
Creatinine, Ser: 0.68 mg/dL (ref 0.44–1.00)
GFR calc Af Amer: >60 mL/min (ref >60)
GFR calc non Af Amer: >60 mL/min (ref >60)
Glucose, Bld: 103 mg/dL — ABNORMAL HIGH (ref 70–99)
Potassium: 3.9 mmol/L (ref 3.5–5.1)
Sodium: 141 mmol/L (ref 135–145)

## 2020-03-27 LAB — SARS CORONAVIRUS 2 BY RT PCR (HOSPITAL ORDER, PERFORMED IN ~~LOC~~ HOSPITAL LAB): SARS Coronavirus 2: NEGATIVE

## 2020-03-27 MED ORDER — CARBIDOPA-LEVODOPA 25-100 MG PO TABS
1.0000 | ORAL_TABLET | Freq: Three times a day (TID) | ORAL | Status: DC
  Filled 2020-03-27: qty 1

## 2020-03-27 MED ORDER — ACETAMINOPHEN 650 MG RE SUPP: 650.0000 mg | Freq: Four times a day (QID) | RECTAL | Status: DC | PRN

## 2020-03-27 MED ORDER — GABAPENTIN 300 MG PO CAPS
300.0000 mg | ORAL_CAPSULE | Freq: Three times a day (TID) | ORAL | Status: DC
  Administered 2020-03-27 – 2020-04-02 (×17): 300 mg via ORAL
  Filled 2020-03-27 (×17): qty 1

## 2020-03-27 MED ORDER — OXYCODONE HCL 5 MG PO TABS
5.0000 mg | ORAL_TABLET | ORAL | Status: DC | PRN
  Administered 2020-03-28: 5 mg via ORAL
  Filled 2020-03-27: qty 1

## 2020-03-27 MED ORDER — ATORVASTATIN CALCIUM 40 MG PO TABS
40.0000 mg | ORAL_TABLET | Freq: Every day | ORAL | Status: DC
  Administered 2020-03-28 – 2020-04-02 (×6): 40 mg via ORAL
  Filled 2020-03-27 (×6): qty 1

## 2020-03-27 MED ORDER — METOPROLOL TARTRATE 25 MG PO TABS
25.0000 mg | ORAL_TABLET | Freq: Two times a day (BID) | ORAL | Status: DC
  Administered 2020-03-27 – 2020-04-02 (×12): 25 mg via ORAL
  Filled 2020-03-27 (×12): qty 1

## 2020-03-27 MED ORDER — ADULT MULTIVITAMIN W/MINERALS CH
1.0000 | ORAL_TABLET | Freq: Every day | ORAL | Status: DC
  Administered 2020-03-28 – 2020-04-02 (×6): 1 via ORAL
  Filled 2020-03-27 (×6): qty 1

## 2020-03-27 MED ORDER — ACETAMINOPHEN 500 MG PO TABS
1000.0000 mg | ORAL_TABLET | Freq: Three times a day (TID) | ORAL | Status: DC
  Administered 2020-03-27 – 2020-04-02 (×17): 1000 mg via ORAL
  Filled 2020-03-27 (×17): qty 2

## 2020-03-27 MED ORDER — SERTRALINE HCL 100 MG PO TABS
200.0000 mg | ORAL_TABLET | Freq: Every day | ORAL | Status: DC
  Administered 2020-03-28 – 2020-04-02 (×6): 200 mg via ORAL
  Filled 2020-03-27 (×6): qty 2

## 2020-03-27 MED ORDER — VITAMIN D 25 MCG (1000 UNIT) PO TABS
2000.0000 [IU] | ORAL_TABLET | Freq: Every day | ORAL | Status: DC
  Administered 2020-03-28 – 2020-04-02 (×6): 2000 [IU] via ORAL
  Filled 2020-03-27 (×6): qty 2

## 2020-03-27 MED ORDER — OXYCODONE HCL 5 MG PO TABS
2.5000 mg | ORAL_TABLET | ORAL | Status: DC | PRN
  Administered 2020-03-28: 2.5 mg via ORAL
  Filled 2020-03-27: qty 1

## 2020-03-27 MED ORDER — ACETAMINOPHEN 325 MG PO TABS: 650.0000 mg | ORAL_TABLET | Freq: Four times a day (QID) | ORAL | Status: DC | PRN

## 2020-03-27 MED ORDER — ENOXAPARIN SODIUM 40 MG/0.4ML ~~LOC~~ SOLN
40.0000 mg | SUBCUTANEOUS | Status: DC
  Administered 2020-03-27 – 2020-03-28 (×2): 40 mg via SUBCUTANEOUS
  Filled 2020-03-27 (×2): qty 0.4

## 2020-03-27 MED ORDER — AMLODIPINE BESYLATE 5 MG PO TABS
5.0000 mg | ORAL_TABLET | Freq: Two times a day (BID) | ORAL | Status: DC
  Administered 2020-03-27 – 2020-04-02 (×9): 5 mg via ORAL
  Filled 2020-03-27 (×12): qty 1

## 2020-03-27 MED ORDER — TRAZODONE HCL 50 MG PO TABS
50.0000 mg | ORAL_TABLET | Freq: Every day | ORAL | Status: DC
  Administered 2020-03-27 – 2020-04-01 (×6): 50 mg via ORAL
  Filled 2020-03-27 (×6): qty 1

## 2020-03-27 MED ORDER — CARBIDOPA-LEVODOPA 25-100 MG PO TABS
1.0000 | ORAL_TABLET | Freq: Three times a day (TID) | ORAL | Status: DC
  Administered 2020-03-27 – 2020-04-02 (×18): 1 via ORAL
  Filled 2020-03-27 (×20): qty 1

## 2020-03-27 MED ORDER — ARIPIPRAZOLE 10 MG PO TABS
10.0000 mg | ORAL_TABLET | Freq: Every day | ORAL | Status: DC
  Administered 2020-03-28 – 2020-04-02 (×6): 10 mg via ORAL
  Filled 2020-03-27 (×6): qty 1

## 2020-03-27 MED ORDER — BUPROPION HCL ER (XL) 300 MG PO TB24
450.0000 mg | ORAL_TABLET | Freq: Every day | ORAL | Status: DC
  Administered 2020-03-28 – 2020-04-02 (×6): 450 mg via ORAL
  Filled 2020-03-27 (×6): qty 1

## 2020-03-27 MED ORDER — POLYETHYLENE GLYCOL 3350 17 G PO PACK
17.0000 g | PACK | Freq: Every day | ORAL | Status: DC | PRN
  Filled 2020-03-27: qty 1

## 2020-03-27 MED ORDER — ASPIRIN 81 MG PO CHEW
81.0000 mg | CHEWABLE_TABLET | Freq: Every day | ORAL | Status: DC
  Administered 2020-03-28 – 2020-04-02 (×6): 81 mg via ORAL
  Filled 2020-03-27 (×6): qty 1

## 2020-03-27 MED ORDER — ACETAMINOPHEN 325 MG PO TABS
650.0000 mg | ORAL_TABLET | Freq: Once | ORAL | Status: AC
  Administered 2020-03-27: 650 mg via ORAL
  Filled 2020-03-27: qty 2

## 2020-03-27 MED ORDER — TIZANIDINE HCL 4 MG PO TABS
2.0000 mg | ORAL_TABLET | Freq: Two times a day (BID) | ORAL | Status: DC
  Administered 2020-03-27 – 2020-04-02 (×12): 2 mg via ORAL
  Filled 2020-03-27 (×12): qty 1

## 2020-03-27 MED ORDER — MELATONIN 5 MG PO TABS
5.0000 mg | ORAL_TABLET | Freq: Every day | ORAL | Status: DC
  Administered 2020-03-27 – 2020-04-01 (×6): 5 mg via ORAL
  Filled 2020-03-27 (×6): qty 1

## 2020-03-27 NOTE — ED Provider Notes (Signed)
[facility]-EMERGENCY DEPT Provider Note   CSN: [id number] Arrival date & time: 03/27/20  1053     History Chief Complaint  Patient presents with  . Hip Pain    Dana [name] is a 74 y.o. female.  HPI      Dana [name] is a 74 y.o. female, with a history of hyperlipidemia, HTN, stroke, presenting to the ED with left hip pain following a fall that occurred yesterday.  Her pain is moderate at rest, severe with attempted movement, nonradiating from the anterior left hip. Patient states she fell in her bathroom yesterday when she lost her balance.  She typically ambulates using a walker, however, the walker does not fit in her bathroom and was not using it at the time of the fall.  She was seen in the ED yesterday, however, was not complaining of hip pain at that time.  She states her hip pain really started to bother her when she left the ED. She lives alone in a private residence. She states she has not been able to put weight on her left leg and therefore has not been able to make herself food and has been in the same depend brief since yesterday as well. She took Tylenol yesterday, which adequately controlled her pain at that time. Denies head injury, additional neck pain, back pain, chest pain, shortness of breath, syncope, dizziness, urinary symptoms, recent illness, abdominal pain, N/V/D, numbness, weakness, or any other complaints.  Past Medical History:  Diagnosis Date  . Anemia    none since hysterectomy  . Arthritis    LlTHA 11'15(had a fall)-"remains tender"  . Depression   . Heart murmur    history of  . Hyperlipidemia   . Hypertension   . Stroke [facility])    4-5 yrs ago(left sided weakness) -no residual  . Transfusion history    age 24- "hysterectomy"    Patient Active Problem List   Diagnosis Date Noted  . Fall 03/27/2020  . Metabolic acidosis 10/04/2019  . Acute encephalopathy 10/04/2019  . Hyponatremia 10/04/2019  . Hypokalemia 10/04/2019   . Hypernatremia 10/04/2019  . Polyneuropathy 10/04/2019  . Insomnia 09/07/2018  . Major depressive disorder 07/25/2018  . GAD (generalized anxiety disorder) 07/25/2018  . Hip fracture, left (HCC) 09/15/2014  . Hip fracture requiring operative repair (HCC) 09/15/2014  . Hip fracture (HCC) 09/15/2014  . Chronic back pain 09/15/2014  . History of stroke   . Hypertension   . Hyperlipidemia   . Essential hypertension   . Spastic hemiplegia affecting nondominant side (HCC) 01/12/2012    Past Surgical History:  Procedure Laterality Date  . ABDOMINAL HYSTERECTOMY    . COLONOSCOPY WITH PROPOFOL N/A 12/25/2014   Procedure: COLONOSCOPY WITH PROPOFOL;  Surgeon: Charolett Bumpers, MD;  Location: [facility];  Service: Endoscopy;  Laterality: N/A;  . FRACTURE SURGERY  01/22/12   fx of eye socket from fall-"retained plate"  . HAND SURGERY Left    nerve release -many yrs ago  . HIP ARTHROPLASTY Left 09/16/2014   Procedure: ARTHROPLASTY BIPOLAR HIP;  Surgeon: Sheral Apley, MD;  Location[facility] OR;  Service: Orthopedics;  Laterality: Left;     OB History   No obstetric history on file.     Family History  Problem Relation Age of Onset  . Hyperlipidemia Mother   . Hypertension Mother   . Diabetes Mother   . Heart disease Mother        aortic vavle  replaced  . Hypertension  Father   . Hyperlipidemia Father   . Heart disease Father        aortic valve replaced  . Alzheimer's disease Father     Social History   Tobacco Use  . Smoking status: Never Smoker  . Smokeless tobacco: Never Used  Substance Use Topics  . Alcohol use: Yes    Comment: occasional beer  . Drug use: Never    Home Medications Prior to Admission medications   Medication Sig Start Date End Date Taking? Authorizing Provider  alendronate (FOSAMAX) 70 MG tablet Take 1 tablet (70 mg total) by mouth once a week. Take with 8 oz of water 30 minutes prior to food/meds. Do not lie down for 30 minutes. 10/13/19  Yes  Lassen, Arlo C, PA-C  amLODipine (NORVASC) 5 MG tablet Take 1 tablet (5 mg total) by mouth 2 (two) times daily. 10/13/19  Yes Lassen, Arlo C, PA-C  ARIPiprazole (ABILIFY) 10 MG tablet Take 1 tablet (10 mg total) by mouth daily. 01/02/20  Yes Cherie Ouch, PA-C  aspirin 81 MG chewable tablet Chew 81 mg by mouth daily.   Yes [provider]  atorvastatin (LIPITOR) 40 MG tablet Take 1 tablet (40 mg total) by mouth daily. 10/13/19  Yes Lassen, Arlo C, PA-C  buPROPion (WELLBUTRIN XL) 150 MG 24 hr tablet Take 3 tablets (450 mg total) by mouth daily. 11/27/19  Yes Hurst, Rosey Bath T, PA-C  carbidopa-levodopa (SINEMET IR) 25-100 MG tablet Take 1 tablet by mouth 3 (three) times daily. 01/22/20  Yes McCue, Shanda Bumps, NP  Cholecalciferol (VITAMIN D) 50 MCG (2000 UT) CAPS Take 2,000 Units by mouth daily.    Yes [provider]  gabapentin (NEURONTIN) 300 MG capsule Take 1 capsule (300 mg total) by mouth 3 (three) times daily. 10/13/19  Yes Lassen, Arlo C, PA-C  Melatonin 5 MG TABS Take 5 mg by mouth at bedtime.    Yes [provider]  metoprolol tartrate (LOPRESSOR) 25 MG tablet Take 1 tablet (25 mg total) by mouth 2 (two) times daily. 10/13/19  Yes Lassen, Arlo C, PA-C  Multiple Vitamin (MULTIVITAMIN) tablet Take 1 tablet by mouth daily.   Yes [provider]  sertraline (ZOLOFT) 100 MG tablet Take 2 tablets (200 mg total) by mouth daily. 11/27/19  Yes Hurst, Rosey Bath T, PA-C  tiZANidine (ZANAFLEX) 2 MG tablet Take 1 tablet (2 mg total) by mouth 2 (two) times daily. QAM and QHS 10/13/19  Yes Lassen, Arlo C, PA-C  traZODone (DESYREL) 50 MG tablet Take 1-2 tablets (50-100 mg total) by mouth at bedtime as needed for sleep. Patient taking differently: Take 50 mg by mouth at bedtime.  11/27/19  Yes Melony Overly T, PA-C    Allergies    Percodan [oxycodone-aspirin] and Oxycodone  Review of Systems   Review of Systems  Constitutional: Negative for chills, diaphoresis and fever.    Respiratory: Negative for cough and shortness of breath.   Cardiovascular: Negative for chest pain.  Gastrointestinal: Negative for abdominal pain, diarrhea, nausea and vomiting.  Genitourinary: Negative for dysuria.  Musculoskeletal: Positive for arthralgias. Negative for back pain and neck pain (none acute).  Neurological: Negative for dizziness, syncope, weakness, numbness and headaches.  All other systems reviewed and are negative.   Physical Exam Updated Vital Signs BP 110/60   Pulse 87   Temp 98.2 F (36.8 C) (Oral)   Resp 16   SpO2 96%   Physical Exam Vitals and nursing note reviewed.  Constitutional:  General: She is not in acute distress.    Appearance: She is well-developed. She is not diaphoretic.  HENT:     Head: Normocephalic and atraumatic.     Mouth/Throat:     Mouth: Mucous membranes are moist.     Pharynx: Oropharynx is clear.  Eyes:     Conjunctiva/sclera: Conjunctivae normal.  Cardiovascular:     Rate and Rhythm: Normal rate and regular rhythm.     Pulses: Normal pulses.          Radial pulses are 2+ on the right side and 2+ on the left side.       Posterior tibial pulses are 2+ on the right side and 2+ on the left side.     Heart sounds: Normal heart sounds.     Comments: Tactile temperature in the extremities appropriate and equal bilaterally. Pulmonary:     Effort: Pulmonary effort is normal. No respiratory distress.     Breath sounds: Normal breath sounds.  Abdominal:     Palpations: Abdomen is soft.     Tenderness: There is no abdominal tenderness. There is no guarding.  Musculoskeletal:     Cervical back: Neck supple.     Right lower leg: No edema.     Left lower leg: No edema.     Comments: Tenderness to left anterior hip without noted deformity, swelling, or instability. No tenderness to the rest of the left lower extremity. There is old appearing bruising to the right anterior knee with mild localized tenderness, but no other  abnormalities noted. The patient's other extremities were examined and the joints were ranged without noted deformity, instability, swelling, color abnormality, or pain with range of motion. Patient is in an Aspen cervical collar and this was not removed for the exam. No midline spinal tenderness in the back.  Overall trauma exam performed without any abnormalities noted other than those mentioned.  Lymphadenopathy:     Cervical: No cervical adenopathy.  Skin:    General: Skin is warm and dry.  Neurological:     Mental Status: She is alert and oriented to person, place, and time.     Comments: No noted acute cognitive deficit. Sensation grossly intact to light touch in the extremities.   Grip strengths equal bilaterally.   Strength 4/5 in the left ankle.  Strength testing in the rest of the left lower extremity was not performed due to the pain in the patient's hip. Strength 4/5 in all other extremities.  Coordination intact.  Cranial nerves III-XII grossly intact.  Handles oral secretions without noted difficulty.  No noted phonation or speech deficit. No facial droop.   Psychiatric:        Mood and Affect: Mood and affect normal.        Speech: Speech normal.        Behavior: Behavior normal.     ED Results / Procedures / Treatments   Labs (all labs ordered are listed, but only abnormal results are displayed) Labs Reviewed  CBC WITH DIFFERENTIAL/PLATELET - Abnormal; Notable for the following components:      Result Value   RBC 3.75 (*)    Hemoglobin 10.8 (*)    HCT 33.7 (*)    All other components within normal limits  BASIC METABOLIC PANEL - Abnormal; Notable for the following components:   Glucose, Bld 103 (*)    All other components within normal limits  SARS CORONAVIRUS 2 BY RT PCR (HOSPITAL ORDER, [place] [facility])  Hemoglobin  Date Value Ref Range Status  03/27/2020 10.8 (L) 12.0 - 15.0 g/dL Final  32/10/2481 50.0 (L) 12.0 - 15.0 g/dL Final   37/02/8888 16.9 11.1 - 15.9 g/dL Final  45/01/8881 80.0 (L) 12.0 - 15.0 g/dL Final  34/91/7915 05.6 12.0 - 15.0 g/dL Final    EKG None  Radiology CT Cervical Spine Wo Contrast  Result Date: 03/26/2020 CLINICAL DATA:  Syncopal episode. Left shoulder left leg pain. EXAM: CT CERVICAL SPINE WITHOUT CONTRAST TECHNIQUE: Multidetector CT imaging of the cervical spine was performed without intravenous contrast. Multiplanar CT image reconstructions were also generated. COMPARISON:  None. FINDINGS: Alignment: Normal. Skull base and vertebrae: Nondisplaced fracture of the anterior arch of C1 with slight posterior subluxation of the left lateral mass of C1 relative to C2. No primary bone lesion or focal pathologic process. Soft tissues and spinal canal: No prevertebral fluid or swelling. No visible canal hematoma. Disc levels: Degenerative disc disease with disc height loss at C6-7, C7-T1 and T1-2. 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 Mild bilateral facet arthropathy at C2-3. At C3-4 there is bilateral facet hypertrophic changes and right foraminal narrowing. At C4-5 there is a mild broad-based disc bulge, bilateral facet arthropathy. At C5-6 there is moderate bilateral facet arthropathy, bilateral uncovertebral degenerative changes and mild left foraminal narrowing. At C6-7 there is a broad-based disc osteophyte complex, bilateral facet arthropathy and bilateral foraminal narrowing. At C7-T1 there is bilateral facet arthropathy. At T1-2 there is bilateral facet arthropathy. Upper chest: Lung apices are clear. Other: Bilateral carotid artery atherosclerosis. IMPRESSION: 1. Nondisplaced fracture of the anterior arch of C1 with slight posterior subluxation of the left lateral mass of C1 relative to C2. 2. Cervical spine spondylosis as described above. Electronically Signed   By: Elige Ko   On: 03/26/2020 17:02   CT Hip Left Wo Contrast  Result Date: 03/27/2020 CLINICAL DATA:   Left hip pain. Constant pain. EXAM: CT OF THE LEFT HIP WITHOUT CONTRAST TECHNIQUE: Multidetector CT imaging of the left hip was performed according to the standard protocol. Multiplanar CT image reconstructions were also generated. COMPARISON:  None. FINDINGS: Bones/Joint/Cartilage Left total hip arthroplasty. No periarticular fluid collection or osteolysis. Acute nondisplaced fracture of the left greater trochanter extending obliquely and medially into the proximal femoral diaphysis. Normal alignment. No joint effusion. Mild osteoarthritis of the left SI joint. Ligaments Ligaments are suboptimally evaluated by CT. Muscles and Tendons No intramuscular hematoma.  Muscles are normal. Soft tissue No fluid collection or hematoma. No soft tissue mass. IMPRESSION: Left total hip arthroplasty. Acute nondisplaced fracture of the left greater trochanter extending obliquely and medially into the proximal femoral diaphysis. Electronically Signed   By: Elige Ko   On: 03/27/2020 14:02   DG Shoulder Left  Result Date: 03/26/2020 CLINICAL DATA:  Fall in bathroom today. Left shoulder pain. Initial encounter. EXAM: LEFT SHOULDER - 2+ VIEW COMPARISON:  None. FINDINGS: There is no evidence of fracture or dislocation. There is no evidence of arthropathy or other focal bone abnormality. Generalized osteopenia noted. Soft tissues are unremarkable. IMPRESSION: No acute findings. Osteopenia. Electronically Signed   By: Danae Orleans M.D.   On: 03/26/2020 15:52   DG Hip Unilat W or Wo Pelvis 2-3 Views Left  Result Date: 03/27/2020 CLINICAL DATA:  Fall yesterday. Persistent left hip pain. Initial encounter. EXAM: DG HIP (WITH OR WITHOUT PELVIS) 2-3V LEFT COMPARISON:  05/29/2019 FINDINGS: Unipolar left hip prosthesis is seen in expected position. No evidence of fracture or  dislocation. No other osseous abnormality identified. IMPRESSION: Left hip prosthesis in appropriate position.  No acute findings. Electronically Signed   By:  Danae OrleansJohn A Stahl M.D.   On: 03/27/2020 12:13    Procedures Procedures (including critical care time)  Medications Ordered in ED Medications  carbidopa-levodopa (SINEMET IR) 25-100 MG per tablet immediate release 1 tablet (has no administration in time range)  acetaminophen (TYLENOL) tablet 650 mg (650 mg Oral Given 03/27/20 1131)    ED Course  I have reviewed the triage vital signs and the nursing notes.  Pertinent labs & imaging results that were available during my care of the patient were reviewed by me and considered in my medical decision making (see chart for details).  Clinical Course as of Mar 27 1528  Wed Mar 27, 2020  1441 Spoke with Dr. Eulah PontMurphy, orthopedic surgeon.  Plan for nonoperative mgt. Patient will need to be able to ambulate with touchdown using a walker. He will see the patient tonight or tomorrow morning.   [[facility]]  1459 Spoke with Dr. Lowell GuitarPowell, hospitalist.  Agrees to admit the patient.   [[facility]]    Clinical Course User Index [[facility]] Ezrael Sam, Hillard DankerShawn C, PA-C   MDM Rules/Calculators/A&P                      Patient presents with left hip pain following a fall that occurred yesterday. No evidence of neurovascular compromise. I personally reviewed and interpreted the patient's labs and imaging studies. X-ray of the left hip without acute abnormality.  CT shows nondisplaced fracture through the greater trochanter. Patient unable to ambulate.  Patient lives alone and suspect unsafe to go home.  Admitted for further management.  Orthopedics to see the patient while she is admitted.  Patient underwent left hip arthroplasty in 2015 performed by Dr. Wandra Feinstein. Murphy.  She would like to stay with his practice.  Findings and plan of care discussed with Valeria BatmanJoe Zammit, MD.   Final Clinical Impression(s) / ED Diagnoses Final diagnoses:  Periprosthetic fracture of hip, initial encounter    Rx / DC Orders ED Discharge Orders    None       Concepcion LivingJoy, Louvenia Golomb C, PA-C 03/27/20 1530    Bethann BerkshireZammit,  Joseph, MD 03/27/20 1728

## 2020-03-27 NOTE — Progress Notes (Signed)
I have reviewed her x-rays. Plan for non-operative management of leftprosthetic hip fracture as stem appears stable.   Touchdown weight-bearing formal consult to follow   [employer]

## 2020-03-27 NOTE — ED Notes (Signed)
Called to give report. Nurse at lunch, [employer] to give report

## 2020-03-27 NOTE — ED Notes (Signed)
Changed pts brief and bedding. Placed soiled pants in pt belongings bag.

## 2020-03-27 NOTE — H&P (Signed)
History and Physical    [name] YBO:[id number] DOB: [birth year] DOA: 03/27/2020  PCP: Juluis Rainier, MD  Patient coming from: home  I have personally briefly reviewed patient's old medical records in [facility]  Chief Complaint: left hip  HPI: Dana [name] is Dana [name] 74 y.o. female with medical history significant of HLD, anemia, depression, HTN, CVA with residual L sided weakness who presents after Dana [name] fall.   Patient notes she fell on Saturday, facedown.  She did not seek care after this, but on Tuesday, fell 3 additional times.  The 3rd time she fell on the bathroom floor and hit her left side.  She was seen in the ED and had Dana [name] nondisplaced fracture of her C spine.  When she got home she was not able to walk.  Dana [name] neighbor had to carry her inside.  She came back to the ED today due to L lower extremity pain and inability to walk and was found to have Dana [name] nondisplaced fracture of the L greater trochanter.    ED Course: Imaging, labs, ortho consult.  Admit to hospitalist.   Review of Systems: As per HPI otherwise all other systems reviewed and are negative.  Past Medical History:  Diagnosis Date  . Anemia    none since hysterectomy  . Arthritis    LlTHA 11'15(had Dana [name] fall)-"remains tender"  . Depression   . Heart murmur    history of  . Hyperlipidemia   . Hypertension   . Stroke [facility])    4-5 yrs ago(left sided weakness) -no residual  . Transfusion history    age 62- "hysterectomy"    Past Surgical History:  Procedure Laterality Date  . ABDOMINAL HYSTERECTOMY    . COLONOSCOPY WITH PROPOFOL N/Niklaus Mamaril 12/25/2014   Procedure: COLONOSCOPY WITH PROPOFOL;  Surgeon: Charolett Bumpers, MD;  Location: [facility];  Service: Endoscopy;  Laterality: N/Macari Zalesky;  . FRACTURE SURGERY  01/22/12   fx of eye socket from fall-"retained plate"  . HAND SURGERY Left    nerve release -many yrs ago  . HIP ARTHROPLASTY Left 09/16/2014   Procedure: ARTHROPLASTY BIPOLAR HIP;  Surgeon: Sheral Apley, MD;   Location: [facility] OR;  Service: Orthopedics;  Laterality: Left;    Social History  reports that she has never smoked. She has never used smokeless tobacco. She reports current alcohol use. She reports that she does not use drugs.  Allergies  Allergen Reactions  . Percodan [Oxycodone-Aspirin] Nausea Only  . Oxycodone Nausea Only    Family History  Problem Relation Age of Onset  . Hyperlipidemia Mother   . Hypertension Mother   . Diabetes Mother   . Heart disease Mother        aortic vavle  replaced  . Hypertension Father   . Hyperlipidemia Father   . Heart disease Father        aortic valve replaced  . Alzheimer's disease Father    Prior to Admission medications   Medication Sig Start Date End Date Taking? Authorizing Provider  alendronate (FOSAMAX) 70 MG tablet Take 1 tablet (70 mg total) by mouth once Francess Mullen week. Take with 8 oz of water 30 minutes prior to food/meds. Do not lie down for 30 minutes. 10/13/19  Yes Lassen, Arlo C, PA-C  amLODipine (NORVASC) 5 MG tablet Take 1 tablet (5 mg total) by mouth 2 (two) times daily. 10/13/19  Yes Lassen, Arlo C, PA-C  ARIPiprazole (ABILIFY) 10 MG tablet Take 1 tablet (10 mg total) by mouth daily. 01/02/20  Yes Cherie Ouch, PA-C  aspirin 81 MG chewable tablet Chew 81 mg by mouth daily.   Yes [provider]  atorvastatin (LIPITOR) 40 MG tablet Take 1 tablet (40 mg total) by mouth daily. 10/13/19  Yes Lassen, Arlo C, PA-C  buPROPion (WELLBUTRIN XL) 150 MG 24 hr tablet Take 3 tablets (450 mg total) by mouth daily. 11/27/19  Yes Hurst, Rosey Bath T, PA-C  carbidopa-levodopa (SINEMET IR) 25-100 MG tablet Take 1 tablet by mouth 3 (three) times daily. 01/22/20  Yes McCue, Shanda Bumps, NP  Cholecalciferol (VITAMIN D) 50 MCG (2000 UT) CAPS Take 2,000 Units by mouth daily.    Yes [provider]  gabapentin (NEURONTIN) 300 MG capsule Take 1 capsule (300 mg total) by mouth 3 (three) times daily. 10/13/19  Yes Lassen, Arlo C, PA-C  Melatonin 5 MG TABS  Take 5 mg by mouth at bedtime.    Yes [provider]  metoprolol tartrate (LOPRESSOR) 25 MG tablet Take 1 tablet (25 mg total) by mouth 2 (two) times daily. 10/13/19  Yes Lassen, Arlo C, PA-C  Multiple Vitamin (MULTIVITAMIN) tablet Take 1 tablet by mouth daily.   Yes [provider]  sertraline (ZOLOFT) 100 MG tablet Take 2 tablets (200 mg total) by mouth daily. 11/27/19  Yes Hurst, Rosey Bath T, PA-C  tiZANidine (ZANAFLEX) 2 MG tablet Take 1 tablet (2 mg total) by mouth 2 (two) times daily. QAM and QHS 10/13/19  Yes Lassen, Arlo C, PA-C  traZODone (DESYREL) 50 MG tablet Take 1-2 tablets (50-100 mg total) by mouth at bedtime as needed for sleep. Patient taking differently: Take 50 mg by mouth at bedtime.  11/27/19  Yes Cherie Ouch, [place]    Physical Exam: Vitals:   03/27/20 1445 03/27/20 1530 03/27/20 1615 03/27/20 1630  BP: 126/69 118/60 (!) 121/58 122/71  Pulse: 80 82 81 80  Resp: 18   18  Temp:      TempSrc:      SpO2: 97% 97% 99% 97%    Constitutional: NAD, calm, comfortable Vitals:   03/27/20 1445 03/27/20 1530 03/27/20 1615 03/27/20 1630  BP: 126/69 118/60 (!) 121/58 122/71  Pulse: 80 82 81 80  Resp: 18   18  Temp:      TempSrc:      SpO2: 97% 97% 99% 97%   Eyes: PERRL, lids and conjunctivae normal ENMT: Mucous membranes are moist. Posterior pharynx clear of any exudate or lesions.Normal dentition.  Neck: normal, supple, no masses, no thyromegaly Respiratory: clear to auscultation bilaterally, no wheezing, no crackles. Cardiovascular: Regular rate and rhythm, no murmurs / rubs / gallops.  Abdomen: no tenderness, no masses palpated. No hepatosplenomegaly. Bowel sounds positive.  Musculoskeletal: pain to LLE Skin: no rashes, lesions, ulcers. No induration Neurologic: CN 2-12 grossly intact. Sensation intact, DTR normal. Strength 5/5 in all 4.  Psychiatric: Normal judgment and insight. Alert and oriented x 3. Normal mood.   Labs on Admission: I have  personally reviewed following labs and imaging studies  CBC: Recent Labs  Lab 03/27/20 1133  WBC 7.5  NEUTROABS 5.4  HGB 10.8*  HCT 33.7*  MCV 89.9  PLT 315    Basic Metabolic Panel: Recent Labs  Lab 03/27/20 1133  NA 141  K 3.9  CL 105  CO2 26  GLUCOSE 103*  BUN 21  CREATININE 0.68  CALCIUM 9.6    GFR: Estimated Creatinine Clearance: 49.2 mL/min (by C-G formula based on SCr of 0.68 mg/dL).  Liver Function Tests: No results  for input(s): AST, ALT, ALKPHOS, BILITOT, PROT, ALBUMIN in the last 168 hours.  Urine analysis:    Component Value Date/Time   COLORURINE YELLOW 03/11/2011 1828   APPEARANCEUR CLEAR 03/11/2011 1828   LABSPEC 1.013 03/11/2011 1828   PHURINE 7.0 03/11/2011 1828   GLUCOSEU NEGATIVE 03/11/2011 1828   HGBUR NEGATIVE 03/11/2011 1828   BILIRUBINUR NEGATIVE 03/11/2011 1828   KETONESUR NEGATIVE 03/11/2011 1828   PROTEINUR NEGATIVE 03/11/2011 1828   UROBILINOGEN 0.2 03/11/2011 1828   NITRITE NEGATIVE 03/11/2011 1828   LEUKOCYTESUR  03/11/2011 1828    NEGATIVE MICROSCOPIC NOT DONE ON URINES WITH NEGATIVE PROTEIN, BLOOD, LEUKOCYTES, NITRITE, OR GLUCOSE <1000 mg/dL.    Radiological Exams on Admission: CT Cervical Spine Wo Contrast  Result Date: 03/26/2020 CLINICAL DATA:  Syncopal episode. Left shoulder left leg pain. EXAM: CT CERVICAL SPINE WITHOUT CONTRAST TECHNIQUE: Multidetector CT imaging of the cervical spine was performed without intravenous contrast. Multiplanar CT image reconstructions were also generated. COMPARISON:  None. FINDINGS: Alignment: Normal. Skull base and vertebrae: Nondisplaced fracture of the anterior arch of C1 with slight posterior subluxation of the left lateral mass of C1 relative to C2. No primary bone lesion or focal pathologic process. Soft tissues and spinal canal: No prevertebral fluid or swelling. No visible canal hematoma. Disc levels: Degenerative disc disease with disc height loss at C6-7, C7-T1 and T1-2. 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 Mild bilateral facet arthropathy at C2-3. At C3-4 there is bilateral facet hypertrophic changes and right foraminal narrowing. At C4-5 there is Yailyn Strack mild broad-based disc bulge, bilateral facet arthropathy. At C5-6 there is moderate bilateral facet arthropathy, bilateral uncovertebral degenerative changes and mild left foraminal narrowing. At C6-7 there is Miquan Tandon broad-based disc osteophyte complex, bilateral facet arthropathy and bilateral foraminal narrowing. At C7-T1 there is bilateral facet arthropathy. At T1-2 there is bilateral facet arthropathy. Upper chest: Lung apices are clear. Other: Bilateral carotid artery atherosclerosis. IMPRESSION: 1. Nondisplaced fracture of the anterior arch of C1 with slight posterior subluxation of the left lateral mass of C1 relative to C2. 2. Cervical spine spondylosis as described above. Electronically Signed   By: Elige Ko   On: 03/26/2020 17:02   CT Hip Left Wo Contrast  Result Date: 03/27/2020 CLINICAL DATA:  Left hip pain. Constant pain. EXAM: CT OF THE LEFT HIP WITHOUT CONTRAST TECHNIQUE: Multidetector CT imaging of the left hip was performed according to the standard protocol. Multiplanar CT image reconstructions were also generated. COMPARISON:  None. FINDINGS: Bones/Joint/Cartilage Left total hip arthroplasty. No periarticular fluid collection or osteolysis. Acute nondisplaced fracture of the left greater trochanter extending obliquely and medially into the proximal femoral diaphysis. Normal alignment. No joint effusion. Mild osteoarthritis of the left SI joint. Ligaments Ligaments are suboptimally evaluated by CT. Muscles and Tendons No intramuscular hematoma.  Muscles are normal. Soft tissue No fluid collection or hematoma. No soft tissue mass. IMPRESSION: Left total hip arthroplasty. Acute nondisplaced fracture of the left greater trochanter extending obliquely and medially into the proximal femoral  diaphysis. Electronically Signed   By: Elige Ko   On: 03/27/2020 14:02   DG CHEST PORT 1 VIEW  Result Date: 03/27/2020 CLINICAL DATA:  Preoperative hip fracture. EXAM: PORTABLE CHEST 1 VIEW COMPARISON:  September 25, 2019 FINDINGS: There is no evidence of acute infiltrate, pleural effusion or pneumothorax. The heart size and mediastinal contours are within normal limits. There is mild to moderate severity calcification of the thoracic aorta. The visualized skeletal structures are unremarkable. IMPRESSION:  No active disease. Electronically Signed   By: Virgina Norfolk M.D.   On: 03/27/2020 15:40   DG Shoulder Left  Result Date: 03/26/2020 CLINICAL DATA:  Fall in bathroom today. Left shoulder pain. Initial encounter. EXAM: LEFT SHOULDER - 2+ VIEW COMPARISON:  None. FINDINGS: There is no evidence of fracture or dislocation. There is no evidence of arthropathy or other focal bone abnormality. Generalized osteopenia noted. Soft tissues are unremarkable. IMPRESSION: No acute findings. Osteopenia. Electronically Signed   By: Marlaine Hind M.D.   On: 03/26/2020 15:52   DG Hip Unilat W or Wo Pelvis 2-3 Views Left  Result Date: 03/27/2020 CLINICAL DATA:  Fall yesterday. Persistent left hip pain. Initial encounter. EXAM: DG HIP (WITH OR WITHOUT PELVIS) 2-3V LEFT COMPARISON:  05/29/2019 FINDINGS: Unipolar left hip prosthesis is seen in expected position. No evidence of fracture or dislocation. No other osseous abnormality identified. IMPRESSION: Left hip prosthesis in appropriate position.  No acute findings. Electronically Signed   By: Marlaine Hind M.D.   On: 03/27/2020 12:13    EKG: Independently reviewed. NSR  Assessment/Plan Active Problems:   Fall  Acute Nondisplaced Fracture of L Greater Trochanter: Dr. Percell Miller reviewed films, plan for nonoperative management Orthopedics to see formally (pending) Touchdown weight bearing  PT/OT Pain control, bowel regimen   Nondisplaced Fx of the Anterior  Arch of C1 with slight posterior subluxation of the L lateral Mass of C1 relative to C2:  Per EDP note from 5/18, neurosurgery recommended aspen collar and outpatient follow up  Recurrent Falls: Pt notes this is longstanding issue, follow therapy evaluation.    Depression: continue wellbutrin, abilify, zoloft, trazodone  HTN: continue norvasc, metoprolol  HLD: continue lipitor  Parkinson's disease: continue sinemet (pt denied hx of parkinson's to me, but in neurology note from 02/28/2019, appears neurology suspected parkinson's at that time as likely cause of tremor, gait/balance difficulties, and frequent falls) - continue sinemet  Osteoporosis: fosamax on hold, follow outpatient  CVA with residual left sided weakness: aspirin/statin  Continue tizanidine and gabapentin  DVT prophylaxis: lovenox  Code Status:   dnr  Family Communication:  None at bedside, declined me calling  Disposition Plan:   Patient is from:  home  Anticipated DC to:  Suspect she'll need snf placement with her inability to walk, currently lives at home  Anticipated DC date:  2-3 day hospital stay for pain control/rehab/orthopedic eval and placement  Anticipated DC barriers: Therapy eval, ortho eval, pain control  Consults called:  orthopedics  Admission status:  inpatient   Fayrene Helper MD [facility]  How to contact the [facility] Attending or Consulting provider [place] or covering provider during after hours [place], for this patient?   1. Check the care team in [facility] and look for Kaidan Harpster) attending/consulting [facility] provider listed and b) the [facility] team listed 2. Log into [email or web address] and use [place]'s universal password to access. If you do not have the password, please contact the hospital operator. 3. Locate the [facility] provider you are looking for under [facility] and page to Caileen Veracruz number that you can be directly reached. 4. If you still have difficulty reaching the provider, please page the [facility] (Director  on Call) for the Hospitalists listed on amion for assistance.  03/27/2020, 7:46 PM

## 2020-03-27 NOTE — ED Triage Notes (Signed)
Per [facility] pt from home for left hip pain and not able to bear weight on her left leg. Uses cane/walker at home but unable to get around due to pain. Pt lives alone. EMS reports not safe for pt to go back home alone. Pt was seen here yesterday for a fall and reports her left hip was hurting when she left here. Pt denies no new falls or injuries after leaving here.

## 2020-03-27 NOTE — ED Notes (Signed)
Changed pts brief, and bedding. Placed pure wick, education provided.

## 2020-03-27 NOTE — ED Notes (Signed)
Transport called. All pt belongings placed in bag.

## 2020-03-28 DIAGNOSIS — Z96649 Presence of unspecified artificial hip joint: Secondary | ICD-10-CM

## 2020-03-28 DIAGNOSIS — M978XXA Periprosthetic fracture around other internal prosthetic joint, initial encounter: Secondary | ICD-10-CM

## 2020-03-28 LAB — COMPREHENSIVE METABOLIC PANEL
ALT: <5 U/L (ref 0–44)
AST: 20 U/L (ref 15–41)
Albumin: 3.5 g/dL (ref 3.5–5.0)
Alkaline Phosphatase: 69 U/L (ref 38–126)
Anion gap: 10 (ref 5–15)
BUN: 14 mg/dL (ref 8–23)
CO2: 22 mmol/L (ref 22–32)
Calcium: 8.8 mg/dL — ABNORMAL LOW (ref 8.9–10.3)
Chloride: 105 mmol/L (ref 98–111)
Creatinine, Ser: 0.65 mg/dL (ref 0.44–1.00)
GFR calc Af Amer: >60 mL/min (ref >60)
GFR calc non Af Amer: >60 mL/min (ref >60)
Glucose, Bld: 109 mg/dL — ABNORMAL HIGH (ref 70–99)
Potassium: 3.4 mmol/L — ABNORMAL LOW (ref 3.5–5.1)
Sodium: 137 mmol/L (ref 135–145)
Total Bilirubin: 0.6 mg/dL (ref 0.3–1.2)
Total Protein: 6.4 g/dL — ABNORMAL LOW (ref 6.5–8.1)

## 2020-03-28 LAB — CBC
HCT: 33.3 % — ABNORMAL LOW (ref 36.0–46.0)
Hemoglobin: 10.7 g/dL — ABNORMAL LOW (ref 12.0–15.0)
MCH: 28.8 pg (ref 26.0–34.0)
MCHC: 32.1 g/dL (ref 30.0–36.0)
MCV: 89.5 fL (ref 80.0–100.0)
Platelets: 280 10*3/uL (ref 150–400)
RBC: 3.72 MIL/uL — ABNORMAL LOW (ref 3.87–5.11)
RDW: 13.4 % (ref 11.5–15.5)
WBC: 6.7 10*3/uL (ref 4.0–10.5)
nRBC: 0 % (ref 0.0–0.2)

## 2020-03-28 NOTE — Evaluation (Addendum)
Occupational Therapy Evaluation Patient Details Name: Dana [name] MRN: [id number] DOB: [birth year] Today's Date: 03/28/2020    History of Present Illness Pt s/p multiple falls with non-operative non-displaced fx of C-spine and L greater trochanter..  Pt with hx of CVA with L side residual weakness and L THR(15)   Clinical Impression   [name] is a 74 year old woman typically independent who presents to hospital s/p fall at home. On evaluation patient presents with complaints of pain in left hip, decreased ROM and strength of left upper and lower extremity, decreased activity tolerance and impaired sitting and standing balance resulting in significant impairments in ability to perform bed mobility, standing, ambulation and ADLs. Patient is limited by TDW bearing status secondary to non surgical intervention of left greater trochanter fracture. Patient wearing j-collar and able to verbalize wear schedule. Patient will benefit from skilled OT services to improve deficits and learn compensatory strategies as needed in order to improve functional abilities in order to return to PLOF. Patient will need short term rehab at discharge.    Follow Up Recommendations  SNF    Equipment Recommendations  (Defer to next level of care.)    Recommendations for Other Services       Precautions / Restrictions Precautions Precautions: Fall Required Braces or Orthoses: Cervical Brace Cervical Brace: Hard collar;At all times Restrictions Weight Bearing Restrictions: Yes LLE Weight Bearing: Touchdown weight bearing      Mobility Bed Mobility Overal bed mobility: Needs Assistance Bed Mobility: Supine to Sit;Sit to Supine Rolling: Mod assist   Supine to sit: +2 for physical assistance;+2 for safety/equipment;Mod assist Sit to supine: Max assist;+2 for physical assistance;+2 for safety/equipment   General bed mobility comments: Physical assist to manage LEs and to control trunk.  Pt assisted  with pad to move to EOB sitting;   Transfers Overall transfer level: Needs assistance Equipment used: Rolling walker (2 wheeled) Transfers: Sit to/from Stand Sit to Stand: Max assist;+2 safety/equipment;+2 physical assistance;From elevated surface         General transfer comment: multimodal cues for LE management and use of UEs to self assist;  Physical assist to block feet and to bring wt up and fwd and to balance in standing with support of RW.    Balance Overall balance assessment: Needs assistance Sitting-balance support: Single extremity supported Sitting balance-Leahy Scale: Poor     Standing balance support: Bilateral upper extremity supported Standing balance-Leahy Scale: Zero                             ADL either performed or assessed with clinical judgement   ADL Overall ADL's : Needs assistance/impaired Eating/Feeding: Set up;Bed level   Grooming: Set up;Bed level   Upper Body Bathing: Set up;Bed level   Lower Body Bathing: +2 for physical assistance;Sit to/from stand;Maximal assistance;Cueing for safety   Upper Body Dressing : Minimal assistance;Bed level   Lower Body Dressing: +2 for physical assistance;Total assistance;Sit to/from stand;Cueing for safety   Toilet Transfer: +2 for physical assistance;+2 for safety/equipment;BSC;RW;Stand-pivot   Toileting- Clothing Manipulation and Hygiene: Total assistance;+2 for physical assistance;Sit to/from stand     Tub/Shower Transfer Details (indicate cue type and reason): unable at this time. Functional mobility during ADLs: +2 for physical assistance;Rolling walker;Cueing for safety       Vision Baseline Vision/History: Wears glasses       Perception     Praxis      Pertinent Vitals/Pain  Pain Assessment: Faces Faces Pain Scale: Hurts a little bit Pain Location: L hip intermittently with movement - pt denies pain at rest Pain Descriptors / Indicators: Grimacing;Guarding;Sharp Pain  Intervention(s): Limited activity within patient's tolerance;Monitored during session;Premedicated before session     Hand Dominance Right   Extremity/Trunk Assessment Upper Extremity Assessment Upper Extremity Assessment: LUE deficits/detail RUE Deficits / Details: RUE 4/5 strength LUE Deficits / Details: residual left sided weakness. grossly functional ROM throughout 4-/5   Lower Extremity Assessment Lower Extremity Assessment: Defer to PT evaluation LLE: Unable to fully assess due to pain   Cervical / Trunk Assessment Cervical / Trunk Assessment: Kyphotic   Communication Communication Communication: No difficulties   Cognition Arousal/Alertness: Awake/alert Behavior During Therapy: WFL for tasks assessed/performed Overall Cognitive Status: Within Functional Limits for tasks assessed                                     General Comments       Exercises Exercises: General Lower Extremity General Exercises - Lower Extremity Ankle Circles/Pumps: AROM;Both;10 reps;Supine   Shoulder Instructions      Home Living Family/patient expects to be discharged to:: Skilled nursing facility Living Arrangements: Alone   Type of Home: House Home Access: Stairs to enter Entrance Stairs-Number of Steps: 1   Home Layout: One level     Bathroom Shower/Tub: Teacher, early years/pre: Standard Bathroom Accessibility: Yes   Home Equipment: Environmental consultant - 2 wheels;Cane - single point;Walker - 4 wheels          Prior Functioning/Environment Level of Independence: Independent with assistive device(s)                 OT Problem List: Decreased strength;Decreased activity tolerance;Impaired balance (sitting and/or standing);Decreased safety awareness;Decreased knowledge of use of DME or AE;Pain;Decreased knowledge of precautions      OT Treatment/Interventions: Self-care/ADL training;Therapeutic exercise;Therapeutic activities;DME and/or AE  instruction;Balance training;Patient/family education    OT Goals(Current goals can be found in the care plan section) Acute Rehab OT Goals Patient Stated Goal: to go to rehab OT Goal Formulation: With patient Time For Goal Achievement: 04/11/20 Potential to Achieve Goals: Fair ADL Goals Pt Will Perform Grooming: sitting;with supervision Pt Will Perform Upper Body Bathing: with set-up;sitting Pt Will Perform Lower Body Bathing: with min assist;with set-up;sit to/from stand Pt Will Perform Upper Body Dressing: with set-up;sitting Pt Will Perform Lower Body Dressing: with mod assist;sit to/from stand;sitting/lateral leans Pt Will Transfer to Toilet: with min assist;stand pivot transfer;bedside commode Pt Will Perform Toileting - Clothing Manipulation and hygiene: with mod assist;sit to/from stand  OT Frequency: Min 2X/week   Barriers to D/C:            Co-evaluation PT/OT/SLP Co-Evaluation/Treatment: Yes Reason for Co-Treatment: For patient/therapist safety PT goals addressed during session: Mobility/safety with mobility OT goals addressed during session: ADL's and self-care      AM-PAC OT "6 Clicks" Daily Activity     Outcome Measure Help from another person eating meals?: A Little Help from another person taking care of personal grooming?: A Little Help from another person toileting, which includes using toliet, bedpan, or urinal?: A Lot Help from another person bathing (including washing, rinsing, drying)?: A Lot Help from another person to put on and taking off regular upper body clothing?: A Little Help from another person to put on and taking off regular lower body clothing?: Total 6 Click Score:  14   End of Session Equipment Utilized During Treatment: Gait belt;Rolling walker Nurse Communication: Mobility status  Activity Tolerance: Patient tolerated treatment well Patient left: in bed;with call bell/phone within reach;with bed alarm set  OT Visit Diagnosis:  Unsteadiness on feet (R26.81);Other abnormalities of gait and mobility (R26.89);Repeated falls (R29.6);Muscle weakness (generalized) (M62.81);Pain Pain - Right/Left: Left Pain - part of body: Hip                Time: 1520-1557 OT Time Calculation (min): 37 min Charges:  OT Evaluation $OT Eval Moderate Complexity: 1 Mod  Danay Mckellar, OTR/L Acute Care Rehab Services  Office [phone]   Kelli Churn 03/28/2020, 5:06 PM

## 2020-03-28 NOTE — Progress Notes (Signed)
PT Cancellation Note  Patient Details Name: Dana [name] MRN: [id number] DOB: [birth year]   Cancelled Treatment:     PT order received but eval deferred pending ortho consult.  Will follow.   Jiali Linney 03/28/2020, 7:32 AM

## 2020-03-28 NOTE — Progress Notes (Signed)
PROGRESS NOTE    Dana [name]  ZOX:[id number] DOB: Feb 07, 1946 DOA: 03/27/2020 PCP: Juluis Rainier, MD    Chief Complaint  Patient presents with  . Hip Pain    Brief Narrative:  Bryli [name] is Dana [name] 74 y.o. female with medical history significant of HLD, anemia, depression, HTN, CVA with residual L sided weakness who presents after Francesca Strome fall.   Patient notes she fell on Saturday, facedown.  She did not seek care after this, but on Tuesday, fell 3 additional times.  The 3rd time she fell on the bathroom floor and hit her left side.  She was seen in the ED and had Naoki Migliaccio nondisplaced fracture of her C spine.  When she got home she was not able to walk.  Yamila Cragin neighbor had to carry her inside.  She came back to the ED today due to L lower extremity pain and inability to walk and was found to have Artice Bergerson nondisplaced fracture of the L greater trochanter.    Assessment & Plan:   Active Problems:   Fall  Acute Nondisplaced Fracture of L Greater Trochanter: Dr. Eulah Pont reviewed films, plan for nonoperative management Orthopedics recommending TDWB LLE, follow up with Dr. Eulah Pont in 2-3 weeks Touchdown weight bearing  PT/OT Pain control, bowel regimen   Nondisplaced Fx of the Anterior Arch of C1 with slight posterior subluxation of the L lateral Mass of C1 relative to C2:  Per EDP note from 5/18, neurosurgery recommended aspen collar and outpatient follow up  Recurrent Falls: Pt notes this is longstanding issue, follow therapy evaluation.    Depression: continue wellbutrin, abilify, zoloft, trazodone  HTN: continue norvasc, metoprolol  HLD: continue lipitor  Parkinson's disease: continue sinemet (pt denied hx of parkinson's to me, but in neurology note from 02/28/2019, appears neurology suspected parkinson's at that time as likely cause of tremor, gait/balance difficulties, and frequent falls) - continue sinemet  Osteoporosis: fosamax on hold, follow outpatient  CVA with residual left sided  weakness: aspirin/statin  Continue tizanidine and gabapentin  DVT prophylaxis: lovenox Code Status: DNR Family Communication: none at bedside Disposition:   Status is: Inpatient  Remains inpatient appropriate because:Needs continued inpatient stay for pain control, therapy, and likely placement with admission for pain/recurrent falls   Dispo: The patient is from: Home              Anticipated d/c is to: SNF              Anticipated d/c date is: 2 days              Patient currently is not medically stable to d/c.   Consultants:   orthopedics  Procedures:   none  Antimicrobials:  Anti-infectives (From admission, onward)   None      Subjective: C/o L hip pain  Objective: Vitals:   03/27/20 1800 03/27/20 2139 03/28/20 0402 03/28/20 1355  BP:  125/65 (!) 135/57 (!) 114/55  Pulse:  83 64 79  Resp:  18 20 17   Temp:  98 F (36.7 C) 98 F (36.7 C) 98 F (36.7 C)  TempSrc:  Oral Axillary   SpO2:  97% 96% 95%  Weight: 51.3 kg     Height: 5\' 3"  (1.6 m)       Intake/Output Summary (Last 24 hours) at 03/28/2020 1451 Last data filed at 03/28/2020 1232 Gross per 24 hour  Intake 720 ml  Output 700 ml  Net 20 ml   Filed Weights   03/27/20 1800  Weight: 51.3  kg    Examination:  General exam: Appears calm and comfortable  Respiratory system: Clear to auscultation. Respiratory effort normal. Cardiovascular system: S1 & S2 heard, RRR. Gastrointestinal system: Abdomen is nondistended, soft and nontender.  Central nervous system: Alert and oriented. No focal neurological deficits. Extremities: Symmetric 5 x 5 power. Skin: No rashes, lesions or ulcers Psychiatry: Judgement and insight appear normal. Mood & affect appropriate.     Data Reviewed: I have personally reviewed following labs and imaging studies  CBC: Recent Labs  Lab 03/27/20 1133 03/28/20 0324  WBC 7.5 6.7  NEUTROABS 5.4  --   HGB 10.8* 10.7*  HCT 33.7* 33.3*  MCV 89.9 89.5  PLT 315 280     Basic Metabolic Panel: Recent Labs  Lab 03/27/20 1133 03/28/20 0324  NA 141 137  K 3.9 3.4*  CL 105 105  CO2 26 22  GLUCOSE 103* 109*  BUN 21 14  CREATININE 0.68 0.65  CALCIUM 9.6 8.8*    GFR: Estimated Creatinine Clearance: 49.2 mL/min (by C-G formula based on SCr of 0.65 mg/dL).  Liver Function Tests: Recent Labs  Lab 03/28/20 0324  AST 20  ALT <5  ALKPHOS 69  BILITOT 0.6  PROT 6.4*  ALBUMIN 3.5    CBG: No results for input(s): GLUCAP in the last 168 hours.   Recent Results (from the past 240 hour(s))  SARS Coronavirus 2 by RT PCR (hospital order, performed in [facility] hospital lab) Nasopharyngeal Nasopharyngeal Swab     Status: None   Collection Time: 03/27/20  2:00 PM   Specimen: Nasopharyngeal Swab  Result Value Ref Range Status   SARS Coronavirus 2 NEGATIVE NEGATIVE Final    Comment: (NOTE) SARS-CoV-2 target nucleic acids are NOT DETECTED. The SARS-CoV-2 RNA is generally detectable in upper and lower respiratory specimens during the acute phase of infection. The lowest concentration of SARS-CoV-2 viral copies this assay can detect is 250 copies / mL. Rosamund Nyland negative result does not preclude SARS-CoV-2 infection and should not be used as the sole basis for treatment or other patient management decisions.  Reg Bircher negative result may occur with improper specimen collection / handling, submission of specimen other than nasopharyngeal swab, presence of viral mutation(s) within the areas targeted by this assay, and inadequate number of viral copies (<250 copies / mL). Ruth Kovich negative result must be combined with clinical observations, patient history, and epidemiological information. Fact Sheet for Patients:   BoilerBrush.com.cy Fact Sheet for Healthcare Providers: https://pope.com/ This test is not yet approved or cleared  by the [place] FDA and has been authorized for detection and/or diagnosis of SARS-CoV-2  by FDA under an Emergency Use Authorization (EUA).  This EUA will remain in effect (meaning this test can be used) for the duration of the COVID-19 declaration under Section 564(b)(1) of the Act, 21 U.S.C. section 360bbb-3(b)(1), unless the authorization is terminated or revoked sooner. Performed at [facility], [address], [place], [place] [address]          Radiology Studies: CT Cervical Spine Wo Contrast  Result Date: 03/26/2020 CLINICAL DATA:  Syncopal episode. Left shoulder left leg pain. EXAM: CT CERVICAL SPINE WITHOUT CONTRAST TECHNIQUE: Multidetector CT imaging of the cervical spine was performed without intravenous contrast. Multiplanar CT image reconstructions were also generated. COMPARISON:  None. FINDINGS: Alignment: Normal. Skull base and vertebrae: Nondisplaced fracture of the anterior arch of C1 with slight posterior subluxation of the left lateral mass of C1 relative to C2. No primary bone lesion  or focal pathologic process. Soft tissues and spinal canal: No prevertebral fluid or swelling. No visible canal hematoma. Disc levels: Degenerative disc disease with disc height loss at C6-7, C7-T1 and T1-2. 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 Mild bilateral facet arthropathy at C2-3. At C3-4 there is bilateral facet hypertrophic changes and right foraminal narrowing. At C4-5 there is Alexis Reber mild broad-based disc bulge, bilateral facet arthropathy. At C5-6 there is moderate bilateral facet arthropathy, bilateral uncovertebral degenerative changes and mild left foraminal narrowing. At C6-7 there is Abdulrahman Bracey broad-based disc osteophyte complex, bilateral facet arthropathy and bilateral foraminal narrowing. At C7-T1 there is bilateral facet arthropathy. At T1-2 there is bilateral facet arthropathy. Upper chest: Lung apices are clear. Other: Bilateral carotid artery atherosclerosis. IMPRESSION: 1. Nondisplaced fracture of the anterior arch of C1  with slight posterior subluxation of the left lateral mass of C1 relative to C2. 2. Cervical spine spondylosis as described above. Electronically Signed   By: Elige Ko   On: 03/26/2020 17:02   CT Hip Left Wo Contrast  Result Date: 03/27/2020 CLINICAL DATA:  Left hip pain. Constant pain. EXAM: CT OF THE LEFT HIP WITHOUT CONTRAST TECHNIQUE: Multidetector CT imaging of the left hip was performed according to the standard protocol. Multiplanar CT image reconstructions were also generated. COMPARISON:  None. FINDINGS: Bones/Joint/Cartilage Left total hip arthroplasty. No periarticular fluid collection or osteolysis. Acute nondisplaced fracture of the left greater trochanter extending obliquely and medially into the proximal femoral diaphysis. Normal alignment. No joint effusion. Mild osteoarthritis of the left SI joint. Ligaments Ligaments are suboptimally evaluated by CT. Muscles and Tendons No intramuscular hematoma.  Muscles are normal. Soft tissue No fluid collection or hematoma. No soft tissue mass. IMPRESSION: Left total hip arthroplasty. Acute nondisplaced fracture of the left greater trochanter extending obliquely and medially into the proximal femoral diaphysis. Electronically Signed   By: Elige Ko   On: 03/27/2020 14:02   DG CHEST PORT 1 VIEW  Result Date: 03/27/2020 CLINICAL DATA:  Preoperative hip fracture. EXAM: PORTABLE CHEST 1 VIEW COMPARISON:  September 25, 2019 FINDINGS: There is no evidence of acute infiltrate, pleural effusion or pneumothorax. The heart size and mediastinal contours are within normal limits. There is mild to moderate severity calcification of the thoracic aorta. The visualized skeletal structures are unremarkable. IMPRESSION: No active disease. Electronically Signed   By: Aram Candela M.D.   On: 03/27/2020 15:40   DG Shoulder Left  Result Date: 03/26/2020 CLINICAL DATA:  Fall in bathroom today. Left shoulder pain. Initial encounter. EXAM: LEFT SHOULDER - 2+ VIEW  COMPARISON:  None. FINDINGS: There is no evidence of fracture or dislocation. There is no evidence of arthropathy or other focal bone abnormality. Generalized osteopenia noted. Soft tissues are unremarkable. IMPRESSION: No acute findings. Osteopenia. Electronically Signed   By: Danae Orleans M.D.   On: 03/26/2020 15:52   DG Hip Unilat W or Wo Pelvis 2-3 Views Left  Result Date: 03/27/2020 CLINICAL DATA:  Fall yesterday. Persistent left hip pain. Initial encounter. EXAM: DG HIP (WITH OR WITHOUT PELVIS) 2-3V LEFT COMPARISON:  05/29/2019 FINDINGS: Unipolar left hip prosthesis is seen in expected position. No evidence of fracture or dislocation. No other osseous abnormality identified. IMPRESSION: Left hip prosthesis in appropriate position.  No acute findings. Electronically Signed   By: Danae Orleans M.D.   On: 03/27/2020 12:13        Scheduled Meds: . acetaminophen  1,000 mg Oral Q8H  . amLODipine  5 mg Oral  BID  . ARIPiprazole  10 mg Oral Daily  . aspirin  81 mg Oral Daily  . atorvastatin  40 mg Oral Daily  . buPROPion  450 mg Oral Daily  . carbidopa-levodopa  1 tablet Oral TID  . cholecalciferol  2,000 Units Oral Daily  . enoxaparin (LOVENOX) injection  40 mg Subcutaneous Q24H  . gabapentin  300 mg Oral TID  . melatonin  5 mg Oral QHS  . metoprolol tartrate  25 mg Oral BID  . multivitamin with minerals  1 tablet Oral Daily  . sertraline  200 mg Oral Daily  . tiZANidine  2 mg Oral BID  . traZODone  50 mg Oral QHS   Continuous Infusions:   LOS: 1 day    Time spent: over 30 min    Fayrene Helper, MD [facility]   To contact the attending provider between 7A-7P or the covering provider during after hours 7P-7A, please log into the web site [email or web address] and access using universal [place] password for that web site. If you do not have the password, please call the hospital operator.  03/28/2020, 2:51 PM

## 2020-03-28 NOTE — Consult Note (Signed)
Reason for Consult:Left hip fx Referring Physician: [name] is an 74 y.o. female.  HPI: Dana [name] [name] was at home and lost her balance and fell onto her left side and front. She had immediate left hip pain and could not get up. She was brought to the ED where x-rays showed Dana [name] periprosthetic left hip fx and Dana [name] c-spine fx and orthopedic surgery was consulted. She lives at home by herself and normally walks with Dana [name] rollator.   Past Medical History:  Diagnosis Date  . Anemia    none since hysterectomy  . Arthritis    LlTHA 11'15(had Dana [name] fall)-"remains tender"  . Depression   . Heart murmur    history of  . Hyperlipidemia   . Hypertension   . Stroke [facility])    4-5 yrs ago(left sided weakness) -no residual  . Transfusion history    age 67- "hysterectomy"    Past Surgical History:  Procedure Laterality Date  . ABDOMINAL HYSTERECTOMY    . COLONOSCOPY WITH PROPOFOL N/Dana [name] 12/25/2014   Procedure: COLONOSCOPY WITH PROPOFOL;  Surgeon: Charolett Bumpers, MD;  Location: [facility];  Service: Endoscopy;  Laterality: N/Dana [name];  . FRACTURE SURGERY  01/22/12   fx of eye socket from fall-"retained plate"  . HAND SURGERY Left    nerve release -many yrs ago  . HIP ARTHROPLASTY Left 09/16/2014   Procedure: ARTHROPLASTY BIPOLAR HIP;  Surgeon: Sheral Apley, MD;  Location: [facility] OR;  Service: Orthopedics;  Laterality: Left;    Family History  Problem Relation Age of Onset  . Hyperlipidemia Mother   . Hypertension Mother   . Diabetes Mother   . Heart disease Mother        aortic vavle  replaced  . Hypertension Father   . Hyperlipidemia Father   . Heart disease Father        aortic valve replaced  . Alzheimer's disease Father     Social History:  reports that she has never smoked. She has never used smokeless tobacco. She reports current alcohol use. She reports that she does not use drugs.  Allergies:  Allergies  Allergen Reactions  . Percodan [Oxycodone-Aspirin] Nausea Only  . Oxycodone Nausea  Only    Medications: I have reviewed the patient's current medications.  Results for orders placed or performed during the hospital encounter of 03/27/20 (from the past 48 hour(s))  CBC with Differential     Status: Abnormal   Collection Time: 03/27/20 11:33 AM  Result Value Ref Range   WBC 7.5 4.0 - 10.5 K/uL   RBC 3.75 (L) 3.87 - 5.11 MIL/uL   Hemoglobin 10.8 (L) 12.0 - 15.0 g/dL   HCT 35.5 (L) 73.2 - 20.2 %   MCV 89.9 80.0 - 100.0 fL   MCH 28.8 26.0 - 34.0 pg   MCHC 32.0 30.0 - 36.0 g/dL   RDW 54.2 70.6 - 23.7 %   Platelets 315 150 - 400 K/uL   nRBC 0.0 0.0 - 0.2 %   Neutrophils Relative % 72 %   Neutro Abs 5.4 1.7 - 7.7 K/uL   Lymphocytes Relative 16 %   Lymphs Abs 1.2 0.7 - 4.0 K/uL   Monocytes Relative 9 %   Monocytes Absolute 0.7 0.1 - 1.0 K/uL   Eosinophils Relative 3 %   Eosinophils Absolute 0.2 0.0 - 0.5 K/uL   Basophils Relative 0 %   Basophils Absolute 0.0 0.0 - 0.1 K/uL   Immature Granulocytes 0 %   Abs Immature Granulocytes 0.03  0.00 - 0.07 K/uL    Comment: Performed at [facility], [place] [address] [place], [place] [address]  Basic metabolic panel     Status: Abnormal   Collection Time: 03/27/20 11:33 AM  Result Value Ref Range   Sodium 141 135 - 145 mmol/L   Potassium 3.9 3.5 - 5.1 mmol/L   Chloride 105 98 - 111 mmol/L   CO2 26 22 - 32 mmol/L   Glucose, Bld 103 (H) 70 - 99 mg/dL    Comment: Glucose reference range applies only to samples taken after fasting for at least 8 hours.   BUN 21 8 - 23 mg/dL   Creatinine, Ser 0.68 0.44 - 1.00 mg/dL   Calcium 9.6 8.9 - 10.3 mg/dL   GFR calc non Af Amer >60 >60 mL/min   GFR calc Af Amer >60 >60 mL/min   Anion gap 10 5 - 15    Comment: Performed at [facility], [place] [address], [place], [place] [address]  SARS Coronavirus 2 by RT PCR (hospital order, performed in [facility] hospital lab) Nasopharyngeal Nasopharyngeal Swab     Status: None   Collection Time: 03/27/20  2:00 PM    Specimen: Nasopharyngeal Swab  Result Value Ref Range   SARS Coronavirus 2 NEGATIVE NEGATIVE    Comment: (NOTE) SARS-CoV-2 target nucleic acids are NOT DETECTED. The SARS-CoV-2 RNA is generally detectable in upper and lower respiratory specimens during the acute phase of infection. The lowest concentration of SARS-CoV-2 viral copies this assay can detect is 250 copies / mL. Dana [name] negative result does not preclude SARS-CoV-2 infection and should not be used as the sole basis for treatment or other patient management decisions.  Dana [name] negative result may occur with improper specimen collection / handling, submission of specimen other than nasopharyngeal swab, presence of viral mutation(s) within the areas targeted by this assay, and inadequate number of viral copies (<250 copies / mL). Dana [name] negative result must be combined with clinical observations, patient history, and epidemiological information. Fact Sheet for Patients:   StrictlyIdeas.no Fact Sheet for Healthcare Providers: BankingDealers.co.za This test is not yet approved or cleared  by the [place] FDA and has been authorized for detection and/or diagnosis of SARS-CoV-2 by FDA under an Emergency Use Authorization (EUA).  This EUA will remain in effect (meaning this test can be used) for the duration of the COVID-19 declaration under Section 564(b)(1) of the Act, 21 U.S.C. section 360bbb-3(b)(1), unless the authorization is terminated or revoked sooner. Performed at [facility], [place] [address], [place], [place] [address]   Comprehensive metabolic panel     Status: Abnormal   Collection Time: 03/28/20  3:24 AM  Result Value Ref Range   Sodium 137 135 - 145 mmol/L   Potassium 3.4 (L) 3.5 - 5.1 mmol/L   Chloride 105 98 - 111 mmol/L   CO2 22 22 - 32 mmol/L   Glucose, Bld 109 (H) 70 - 99 mg/dL    Comment: Glucose reference range applies only to samples taken after  fasting for at least 8 hours.   BUN 14 8 - 23 mg/dL   Creatinine, Ser 0.65 0.44 - 1.00 mg/dL   Calcium 8.8 (L) 8.9 - 10.3 mg/dL   Total Protein 6.4 (L) 6.5 - 8.1 g/dL   Albumin 3.5 3.5 - 5.0 g/dL   AST 20 15 - 41 U/L   ALT <5 0 - 44 U/L   Alkaline Phosphatase 69 38 - 126 U/L  Total Bilirubin 0.6 0.3 - 1.2 mg/dL   GFR calc non Af Amer >60 >60 mL/min   GFR calc Af Amer >60 >60 mL/min   Anion gap 10 5 - 15    Comment: Performed at [facility], [address], [place], [place] [address]  CBC     Status: Abnormal   Collection Time: 03/28/20  3:24 AM  Result Value Ref Range   WBC 6.7 4.0 - 10.5 K/uL   RBC 3.72 (L) 3.87 - 5.11 MIL/uL   Hemoglobin 10.7 (L) 12.0 - 15.0 g/dL   HCT 62.3 (L) 76.2 - 83.1 %   MCV 89.5 80.0 - 100.0 fL   MCH 28.8 26.0 - 34.0 pg   MCHC 32.1 30.0 - 36.0 g/dL   RDW 51.7 61.6 - 07.3 %   Platelets 280 150 - 400 K/uL   nRBC 0.0 0.0 - 0.2 %    Comment: Performed at [facility], [address], [place], [place] [address]    CT Cervical Spine Wo Contrast  Result Date: 03/26/2020 CLINICAL DATA:  Syncopal episode. Left shoulder left leg pain. EXAM: CT CERVICAL SPINE WITHOUT CONTRAST TECHNIQUE: Multidetector CT imaging of the cervical spine was performed without intravenous contrast. Multiplanar CT image reconstructions were also generated. COMPARISON:  None. FINDINGS: Alignment: Normal. Skull base and vertebrae: Nondisplaced fracture of the anterior arch of C1 with slight posterior subluxation of the left lateral mass of C1 relative to C2. No primary bone lesion or focal pathologic process. Soft tissues and spinal canal: No prevertebral fluid or swelling. No visible canal hematoma. Disc levels: Degenerative disc disease with disc height loss at C6-7, C7-T1 and T1-2. 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 Mild bilateral facet arthropathy at C2-3. At C3-4 there is bilateral facet hypertrophic changes and  right foraminal narrowing. At C4-5 there is Dana [name] mild broad-based disc bulge, bilateral facet arthropathy. At C5-6 there is moderate bilateral facet arthropathy, bilateral uncovertebral degenerative changes and mild left foraminal narrowing. At C6-7 there is Dana [name] broad-based disc osteophyte complex, bilateral facet arthropathy and bilateral foraminal narrowing. At C7-T1 there is bilateral facet arthropathy. At T1-2 there is bilateral facet arthropathy. Upper chest: Lung apices are clear. Other: Bilateral carotid artery atherosclerosis. IMPRESSION: 1. Nondisplaced fracture of the anterior arch of C1 with slight posterior subluxation of the left lateral mass of C1 relative to C2. 2. Cervical spine spondylosis as described above. Electronically Signed   By: Elige Ko   On: 03/26/2020 17:02   CT Hip Left Wo Contrast  Result Date: 03/27/2020 CLINICAL DATA:  Left hip pain. Constant pain. EXAM: CT OF THE LEFT HIP WITHOUT CONTRAST TECHNIQUE: Multidetector CT imaging of the left hip was performed according to the standard protocol. Multiplanar CT image reconstructions were also generated. COMPARISON:  None. FINDINGS: Bones/Joint/Cartilage Left total hip arthroplasty. No periarticular fluid collection or osteolysis. Acute nondisplaced fracture of the left greater trochanter extending obliquely and medially into the proximal femoral diaphysis. Normal alignment. No joint effusion. Mild osteoarthritis of the left SI joint. Ligaments Ligaments are suboptimally evaluated by CT. Muscles and Tendons No intramuscular hematoma.  Muscles are normal. Soft tissue No fluid collection or hematoma. No soft tissue mass. IMPRESSION: Left total hip arthroplasty. Acute nondisplaced fracture of the left greater trochanter extending obliquely and medially into the proximal femoral diaphysis. Electronically Signed   By: Elige Ko   On: 03/27/2020 14:02   DG CHEST PORT 1 VIEW  Result Date: 03/27/2020 CLINICAL DATA:  Preoperative hip  fracture. EXAM: PORTABLE CHEST 1 VIEW COMPARISON:  September 25, 2019 FINDINGS: There is no evidence of acute infiltrate, pleural effusion or pneumothorax. The heart size and mediastinal contours are within normal limits. There is mild to moderate severity calcification of the thoracic aorta. The visualized skeletal structures are unremarkable. IMPRESSION: No active disease. Electronically Signed   By: Aram Candela M.D.   On: 03/27/2020 15:40   DG Shoulder Left  Result Date: 03/26/2020 CLINICAL DATA:  Fall in bathroom today. Left shoulder pain. Initial encounter. EXAM: LEFT SHOULDER - 2+ VIEW COMPARISON:  None. FINDINGS: There is no evidence of fracture or dislocation. There is no evidence of arthropathy or other focal bone abnormality. Generalized osteopenia noted. Soft tissues are unremarkable. IMPRESSION: No acute findings. Osteopenia. Electronically Signed   By: Danae Orleans M.D.   On: 03/26/2020 15:52   DG Hip Unilat W or Wo Pelvis 2-3 Views Left  Result Date: 03/27/2020 CLINICAL DATA:  Fall yesterday. Persistent left hip pain. Initial encounter. EXAM: DG HIP (WITH OR WITHOUT PELVIS) 2-3V LEFT COMPARISON:  05/29/2019 FINDINGS: Unipolar left hip prosthesis is seen in expected position. No evidence of fracture or dislocation. No other osseous abnormality identified. IMPRESSION: Left hip prosthesis in appropriate position.  No acute findings. Electronically Signed   By: Danae Orleans M.D.   On: 03/27/2020 12:13    Review of Systems  HENT: Negative for ear discharge, ear pain, hearing loss and tinnitus.   Eyes: Negative for photophobia and pain.  Respiratory: Negative for cough and shortness of breath.   Cardiovascular: Negative for chest pain.  Gastrointestinal: Negative for abdominal pain, nausea and vomiting.  Genitourinary: Negative for dysuria, flank pain, frequency and urgency.  Musculoskeletal: Positive for arthralgias (Left hip) and neck pain. Negative for back pain and myalgias.   Neurological: Negative for dizziness and headaches.  Hematological: Does not bruise/bleed easily.  Psychiatric/Behavioral: The patient is not nervous/anxious.    Blood pressure (!) 135/57, pulse 64, temperature 98 F (36.7 C), temperature source Axillary, resp. rate 20, height 5\' 3"  (1.6 m), weight 51.3 kg, SpO2 96 %. Physical Exam  Constitutional: She appears well-developed and well-nourished. No distress.  HENT:  Head: Normocephalic.  Eyes: Conjunctivae are normal. Right eye exhibits no discharge. Left eye exhibits no discharge. No scleral icterus.  Neck:  C-collar  Cardiovascular: Normal rate and regular rhythm.  Respiratory: Effort normal. No respiratory distress.  Musculoskeletal:     Comments: LLE No traumatic wounds, ecchymosis, or rash  Mild TTP hip  No knee or ankle effusion  Knee stable to varus/ valgus and anterior/posterior stress  Sens DPN, SPN, TN intact  Motor EHL, ext, flex, evers 5/5  DP 1+, PT 0, No significant edema  Neurological: She is alert.  Skin: Skin is warm and dry. She is not diaphoretic.  Psychiatric: She has Dana [name] normal mood and affect. Her behavior is normal.    Assessment/Plan: Left hip fx -- May be TDWB LLE, anticipate need for rehab stay prior to return home. F/u with Dr. in 2-3 weeks. C-spine fx Multiple medical problems including HLD, anemia, depression, HTN, CVA with residual L sided weakness -- per primary service    Eulah Pont, PA-C Orthopedic Surgery [phone] 03/28/2020, 9:37 AM

## 2020-03-28 NOTE — Evaluation (Signed)
Physical Therapy Evaluation Patient Details Name: Dana [name] MRN: [id number] DOB: 24-May-1946 Today's Date: 03/28/2020   History of Present Illness  Pt s/p multiple falls with non-operative non-displaced fx of C-spine and L greater trochanter..  Pt with hx of CVA with L side residual weakness and L THR(15)  Clinical Impression  Pt admitted as above and presenting with functional mobility limitations 2* generalized weakness, L hip pain with movement, TDWB on L LE, and significant balance deficits.  Pt would benefit from follow up rehab at SNF level to maximize IND and safety prior to return home with limited assist.    Follow Up Recommendations SNF    Equipment Recommendations  None recommended by PT    Recommendations for Other Services       Precautions / Restrictions Precautions Precautions: Fall Required Braces or Orthoses: Cervical Brace Cervical Brace: Hard collar;At all times Restrictions Weight Bearing Restrictions: Yes LLE Weight Bearing: Touchdown weight bearing      Mobility  Bed Mobility Overal bed mobility: Needs Assistance Bed Mobility: Supine to Sit;Sit to Supine Rolling: Mod assist   Supine to sit: +2 for physical assistance;+2 for safety/equipment;Mod assist Sit to supine: Max assist;+2 for physical assistance;+2 for safety/equipment   General bed mobility comments: Physical assist to manage LEs and to control trunk.  Pt assisted with pad to move to EOB sitting;   Transfers Overall transfer level: Needs assistance Equipment used: Rolling walker (2 wheeled) Transfers: Sit to/from Stand Sit to Stand: Max assist;+2 safety/equipment;+2 physical assistance;From elevated surface         General transfer comment: multimodal cues for LE management and use of UEs to self assist;  Physical assist to block feet and to bring wt up and fwd and to balance in standing with support of RW.  Ambulation/Gait             General Gait Details: Pt attempted  standing x 2 but unable to balance sufficiently well or off load L LE sufficiently to initiate step safely or comply with TDWB  Stairs            Wheelchair Mobility    Modified Rankin (Stroke Patients Only)       Balance Overall balance assessment: Needs assistance Sitting-balance support: Single extremity supported Sitting balance-Leahy Scale: Poor     Standing balance support: Bilateral upper extremity supported Standing balance-Leahy Scale: Zero                               Pertinent Vitals/Pain Pain Assessment: Faces Faces Pain Scale: Hurts even more Pain Location: L hip intermittently with movement - pt denies pain at rest Pain Descriptors / Indicators: Grimacing;Guarding;Sharp Pain Intervention(s): Limited activity within patient's tolerance;Monitored during session;Premedicated before session    Home Living Family/patient expects to be discharged to:: Skilled nursing facility Living Arrangements: Alone   Type of Home: House Home Access: Stairs to enter   [employer] of Steps: 1 Home Layout: One level Home Equipment: Walker - 2 wheels;Cane - single point;Walker - 4 wheels      Prior Function Level of Independence: Independent with assistive device(s)               Hand Dominance   Dominant Hand: Right    Extremity/Trunk Assessment   Upper Extremity Assessment Upper Extremity Assessment: Defer to OT evaluation    Lower Extremity Assessment Lower Extremity Assessment: Generalized weakness;LLE deficits/detail LLE: Unable to fully assess due to  pain    Cervical / Trunk Assessment Cervical / Trunk Assessment: Kyphotic  Communication   Communication: No difficulties  Cognition Arousal/Alertness: Awake/alert Behavior During Therapy: WFL for tasks assessed/performed Overall Cognitive Status: Within Functional Limits for tasks assessed                                        General Comments       Exercises General Exercises - Lower Extremity Ankle Circles/Pumps: AROM;Both;10 reps;Supine   Assessment/Plan    PT Assessment Patient needs continued PT services  PT Problem List Decreased strength;Decreased range of motion;Decreased activity tolerance;Decreased balance;Decreased mobility;Decreased knowledge of use of DME;Pain       PT Treatment Interventions DME instruction;Gait training;Functional mobility training;Therapeutic activities;Therapeutic exercise;Balance training;Patient/family education    PT Goals (Current goals can be found in the Care Plan section)  Acute Rehab PT Goals Patient Stated Goal: Regain IND PT Goal Formulation: With patient Time For Goal Achievement: 04/11/20 Potential to Achieve Goals: Fair    Frequency Min 3X/week   Barriers to discharge Decreased caregiver support Home alone    Co-evaluation PT/OT/SLP Co-Evaluation/Treatment: Yes Reason for Co-Treatment: For patient/therapist safety PT goals addressed during session: Mobility/safety with mobility OT goals addressed during session: ADL's and self-care       AM-PAC PT "6 Clicks" Mobility  Outcome Measure Help needed turning from your back to your side while in a flat bed without using bedrails?: A Lot Help needed moving from lying on your back to sitting on the side of a flat bed without using bedrails?: A Lot Help needed moving to and from a bed to a chair (including a wheelchair)?: A Lot Help needed standing up from a chair using your arms (e.g., wheelchair or bedside chair)?: A Lot Help needed to walk in hospital room?: Total Help needed climbing 3-5 steps with a railing? : Total 6 Click Score: 10    End of Session Equipment Utilized During Treatment: Gait belt Activity Tolerance: Patient limited by fatigue;Patient limited by pain Patient left: in bed;with call bell/phone within reach;with bed alarm set Nurse Communication: Mobility status PT Visit Diagnosis: History of falling  (Z91.81);Difficulty in walking, not elsewhere classified (R26.2);Muscle weakness (generalized) (M62.81);Other symptoms and signs involving the nervous system (R29.898)    Time: 1524-1600 PT Time Calculation (min) (ACUTE ONLY): 36 min   Charges:   PT Evaluation $PT Eval Low Complexity: 1 Low          [facility] Pager [phone] Office [phone]   Jilian West 03/28/2020, 4:44 PM

## 2020-03-29 LAB — CBC WITH DIFFERENTIAL/PLATELET
Abs Immature Granulocytes: 0.03 10*3/uL (ref 0.00–0.07)
Basophils Absolute: 0.1 10*3/uL (ref 0.0–0.1)
Basophils Relative: 1 %
Eosinophils Absolute: 0.3 10*3/uL (ref 0.0–0.5)
Eosinophils Relative: 4 %
HCT: 34.1 % — ABNORMAL LOW (ref 36.0–46.0)
Hemoglobin: 10.7 g/dL — ABNORMAL LOW (ref 12.0–15.0)
Immature Granulocytes: 0 %
Lymphocytes Relative: 22 %
Lymphs Abs: 1.8 10*3/uL (ref 0.7–4.0)
MCH: 28 pg (ref 26.0–34.0)
MCHC: 31.4 g/dL (ref 30.0–36.0)
MCV: 89.3 fL (ref 80.0–100.0)
Monocytes Absolute: 0.6 10*3/uL (ref 0.1–1.0)
Monocytes Relative: 8 %
Neutro Abs: 5.3 10*3/uL (ref 1.7–7.7)
Neutrophils Relative %: 65 %
Platelets: 319 10*3/uL (ref 150–400)
RBC: 3.82 MIL/uL — ABNORMAL LOW (ref 3.87–5.11)
RDW: 13.6 % (ref 11.5–15.5)
WBC: 8.1 10*3/uL (ref 4.0–10.5)
nRBC: 0 % (ref 0.0–0.2)

## 2020-03-29 LAB — COMPREHENSIVE METABOLIC PANEL
ALT: 6 U/L (ref 0–44)
AST: 22 U/L (ref 15–41)
Albumin: 3.5 g/dL (ref 3.5–5.0)
Alkaline Phosphatase: 71 U/L (ref 38–126)
Anion gap: 5 (ref 5–15)
BUN: 12 mg/dL (ref 8–23)
CO2: 29 mmol/L (ref 22–32)
Calcium: 8.7 mg/dL — ABNORMAL LOW (ref 8.9–10.3)
Chloride: 105 mmol/L (ref 98–111)
Creatinine, Ser: 0.63 mg/dL (ref 0.44–1.00)
GFR calc Af Amer: >60 mL/min (ref >60)
GFR calc non Af Amer: >60 mL/min (ref >60)
Glucose, Bld: 118 mg/dL — ABNORMAL HIGH (ref 70–99)
Potassium: 3.8 mmol/L (ref 3.5–5.1)
Sodium: 139 mmol/L (ref 135–145)
Total Bilirubin: 0.8 mg/dL (ref 0.3–1.2)
Total Protein: 6.5 g/dL (ref 6.5–8.1)

## 2020-03-29 LAB — MAGNESIUM: Magnesium: 1.9 mg/dL (ref 1.7–2.4)

## 2020-03-29 LAB — PHOSPHORUS: Phosphorus: 4.3 mg/dL (ref 2.5–4.6)

## 2020-03-29 NOTE — Progress Notes (Signed)
PT is recommending SNF. Met with pt at bedside. She agrees with SNF and she prefers to go to [facility] or [facility]. Informed pt that I'll fax her information to the facilities that she prefers and other facilities in the area and she agreed.  Faxed CSW initial referral to different facilities.  Will contact [facility] after bed offer.

## 2020-03-29 NOTE — Progress Notes (Signed)
PROGRESS NOTE    [name]  UKG:[id number] DOB: 07-Jan-1946 DOA: 03/27/2020 PCP: Juluis Rainier, MD    Chief Complaint  Patient presents with  . Hip Pain    Brief Narrative:  Dana [name] is Dana [name] 74 y.o. female with medical history significant of HLD, anemia, depression, HTN, CVA with residual L sided weakness who presents after Kunio Cummiskey fall.   Patient notes she fell on Saturday, facedown.  She did not seek care after this, but on Tuesday, fell 3 additional times.  The 3rd time she fell on the bathroom floor and hit her left side.  She was seen in the ED and had Berton Butrick nondisplaced fracture of her C spine.  When she got home she was not able to walk.  Madisynn Plair neighbor had to carry her inside.  She came back to the ED today due to L lower extremity pain and inability to walk and was found to have Mackenzy Eisenberg nondisplaced fracture of the L greater trochanter.    Assessment & Plan:   Active Problems:   Fall  Acute Nondisplaced Fracture of L Greater Trochanter: Dr. Eulah Pont reviewed films, plan for nonoperative management Orthopedics recommending TDWB LLE, follow up with Dr. Eulah Pont in 2-3 weeks Touchdown weight bearing  PT/OT Pain control, bowel regimen Plan for SNF when bed available  Nondisplaced Fx of the Anterior Arch of C1 with slight posterior subluxation of the L lateral Mass of C1 relative to C2:  Per EDP note from 5/18, neurosurgery recommended aspen collar and outpatient follow up  Recurrent Falls: Pt notes this is longstanding issue, follow therapy evaluation.    Depression: continue wellbutrin, abilify, zoloft, trazodone  HTN: continue norvasc, metoprolol  HLD: continue lipitor  Parkinson's disease: continue sinemet (pt denied hx of parkinson's to me, but in neurology note from 02/28/2019, appears neurology suspected parkinson's at that time as likely cause of tremor, gait/balance difficulties, and frequent falls) - continue sinemet  Osteoporosis: fosamax on hold, follow  outpatient  CVA with residual left sided weakness: aspirin/statin  Continue tizanidine and gabapentin  DVT prophylaxis: lovenox Code Status: DNR Family Communication: none at bedside Disposition:   Status is: Inpatient  Remains inpatient appropriate because:Needs continued inpatient stay for pain control, therapy, and likely placement with admission for pain/recurrent falls   Dispo: The patient is from: Home              Anticipated d/c is to: SNF              Anticipated d/c date is: 2 days              Patient currently is not medically stable to d/c.   Consultants:   orthopedics  Procedures:   none  Antimicrobials:  Anti-infectives (From admission, onward)   None      Subjective: Doing ok this AM, continued hip pain  Objective: Vitals:   03/28/20 1355 03/28/20 2253 03/29/20 0559 03/29/20 1401  BP: (!) 114/55 (!) 144/81 123/66 107/60  Pulse: 79 83 82 80  Resp: 17 17 17 18   Temp: 98 F (36.7 C) 98.3 F (36.8 C) 98.7 F (37.1 C) 99.6 F (37.6 C)  TempSrc:  Oral  Oral  SpO2: 95% 100% 94% 95%  Weight:      Height:        Intake/Output Summary (Last 24 hours) at 03/29/2020 1552 Last data filed at 03/29/2020 0600 Gross per 24 hour  Intake 400 ml  Output 1350 ml  Net -950 ml   03/31/2020  03/27/20 1800  Weight: 51.3 kg    Examination:  General: No acute distress. Cardiovascular: Heart sounds show Kailan Laws regular rate, and rhythm.  Lungs: Clear to auscultation bilaterally  Abdomen: Soft, nontender, nondistended  Neurological: Alert and oriented 3. Moves all extremities 4 . Cranial nerves II through XII grossly intact. Skin: Warm and dry. No rashes or lesions. Extremities: No clubbing or cyanosis. No edema.  Data Reviewed: I have personally reviewed following labs and imaging studies  CBC: Recent Labs  Lab 03/27/20 1133 03/28/20 0324 03/29/20 0252  WBC 7.5 6.7 8.1  NEUTROABS 5.4  --  5.3  HGB 10.8* 10.7* 10.7*  HCT 33.7* 33.3* 34.1*   MCV 89.9 89.5 89.3  PLT 315 280 319    Basic Metabolic Panel: Recent Labs  Lab 03/27/20 1133 03/28/20 0324 03/29/20 0252  NA 141 137 139  K 3.9 3.4* 3.8  CL 105 105 105  CO2 26 22 29   GLUCOSE 103* 109* 118*  BUN 21 14 12   CREATININE 0.68 0.65 0.63  CALCIUM 9.6 8.8* 8.7*  MG  --   --  1.9  PHOS  --   --  4.3    GFR: Estimated Creatinine Clearance: 49.2 mL/min (by C-G formula based on SCr of 0.63 mg/dL).  Liver Function Tests: Recent Labs  Lab 03/28/20 0324 03/29/20 0252  AST 20 22  ALT <5 6  ALKPHOS 69 71  BILITOT 0.6 0.8  PROT 6.4* 6.5  ALBUMIN 3.5 3.5    CBG: No results for input(s): GLUCAP in the last 168 hours.   Recent Results (from the past 240 hour(s))  SARS Coronavirus 2 by RT PCR (hospital order, performed in [facility] hospital lab) Nasopharyngeal Nasopharyngeal Swab     Status: None   Collection Time: 03/27/20  2:00 PM   Specimen: Nasopharyngeal Swab  Result Value Ref Range Status   SARS Coronavirus 2 NEGATIVE NEGATIVE Final    Comment: (NOTE) SARS-CoV-2 target nucleic acids are NOT DETECTED. The SARS-CoV-2 RNA is generally detectable in upper and lower respiratory specimens during the acute phase of infection. The lowest concentration of SARS-CoV-2 viral copies this assay can detect is 250 copies / mL. Eiko Mcgowen negative result does not preclude SARS-CoV-2 infection and should not be used as the sole basis for treatment or other patient management decisions.  Jazarah Capili negative result may occur with improper specimen collection / handling, submission of specimen other than nasopharyngeal swab, presence of viral mutation(s) within the areas targeted by this assay, and inadequate number of viral copies (<250 copies / mL). Mihran Lebarron negative result must be combined with clinical observations, patient history, and epidemiological information. Fact Sheet for Patients:   [facility] Fact Sheet for Healthcare  Providers: 03/29/20 This test is not yet approved or cleared  by the [employer] FDA and has been authorized for detection and/or diagnosis of SARS-CoV-2 by FDA under an Emergency Use Authorization (EUA).  This EUA will remain in effect (meaning this test can be used) for the duration of the COVID-19 declaration under Section 564(b)(1) of the Act, 21 U.S.C. section 360bbb-3(b)(1), unless the authorization is terminated or revoked sooner. Performed at [facility], [address], [place], [place]          Radiology Studies: No results found.      Scheduled Meds: . acetaminophen  1,000 mg Oral Q8H  . amLODipine  5 mg Oral BID  . ARIPiprazole  10 mg Oral Daily  . aspirin  81 mg Oral Daily  .  atorvastatin  40 mg Oral Daily  . buPROPion  450 mg Oral Daily  . carbidopa-levodopa  1 tablet Oral TID  . cholecalciferol  2,000 Units Oral Daily  . enoxaparin (LOVENOX) injection  40 mg Subcutaneous Q24H  . gabapentin  300 mg Oral TID  . melatonin  5 mg Oral QHS  . metoprolol tartrate  25 mg Oral BID  . multivitamin with minerals  1 tablet Oral Daily  . sertraline  200 mg Oral Daily  . tiZANidine  2 mg Oral BID  . traZODone  50 mg Oral QHS   Continuous Infusions:   LOS: 2 days    Time spent: over 30 min    Fayrene Helper, MD [facility]   To contact the attending provider between 7A-7P or the covering provider during after hours 7P-7A, please log into the web site [email or web address] and access using universal [place] password for that web site. If you do not have the password, please call the hospital operator.  03/29/2020, 3:52 PM

## 2020-03-30 DIAGNOSIS — M978XXA Periprosthetic fracture around other internal prosthetic joint, initial encounter: Secondary | ICD-10-CM

## 2020-03-30 DIAGNOSIS — Z96649 Presence of unspecified artificial hip joint: Secondary | ICD-10-CM

## 2020-03-30 LAB — COMPREHENSIVE METABOLIC PANEL
ALT: <5 U/L (ref 0–44)
AST: 20 U/L (ref 15–41)
Albumin: 3.3 g/dL — ABNORMAL LOW (ref 3.5–5.0)
Alkaline Phosphatase: 65 U/L (ref 38–126)
Anion gap: 6 (ref 5–15)
BUN: 20 mg/dL (ref 8–23)
CO2: 28 mmol/L (ref 22–32)
Calcium: 8.8 mg/dL — ABNORMAL LOW (ref 8.9–10.3)
Chloride: 105 mmol/L (ref 98–111)
Creatinine, Ser: 0.74 mg/dL (ref 0.44–1.00)
GFR calc Af Amer: >60 mL/min (ref >60)
GFR calc non Af Amer: >60 mL/min (ref >60)
Glucose, Bld: 104 mg/dL — ABNORMAL HIGH (ref 70–99)
Potassium: 4.1 mmol/L (ref 3.5–5.1)
Sodium: 139 mmol/L (ref 135–145)
Total Bilirubin: 0.5 mg/dL (ref 0.3–1.2)
Total Protein: 6.2 g/dL — ABNORMAL LOW (ref 6.5–8.1)

## 2020-03-30 LAB — CBC WITH DIFFERENTIAL/PLATELET
Abs Immature Granulocytes: 0.02 10*3/uL (ref 0.00–0.07)
Basophils Absolute: 0.1 10*3/uL (ref 0.0–0.1)
Basophils Relative: 1 %
Eosinophils Absolute: 0.3 10*3/uL (ref 0.0–0.5)
Eosinophils Relative: 5 %
HCT: 35.2 % — ABNORMAL LOW (ref 36.0–46.0)
Hemoglobin: 11 g/dL — ABNORMAL LOW (ref 12.0–15.0)
Immature Granulocytes: 0 %
Lymphocytes Relative: 29 %
Lymphs Abs: 2 10*3/uL (ref 0.7–4.0)
MCH: 28.4 pg (ref 26.0–34.0)
MCHC: 31.3 g/dL (ref 30.0–36.0)
MCV: 91 fL (ref 80.0–100.0)
Monocytes Absolute: 0.6 10*3/uL (ref 0.1–1.0)
Monocytes Relative: 8 %
Neutro Abs: 3.9 10*3/uL (ref 1.7–7.7)
Neutrophils Relative %: 57 %
Platelets: 340 10*3/uL (ref 150–400)
RBC: 3.87 MIL/uL (ref 3.87–5.11)
RDW: 14.1 % (ref 11.5–15.5)
WBC: 6.9 10*3/uL (ref 4.0–10.5)
nRBC: 0 % (ref 0.0–0.2)

## 2020-03-30 LAB — MAGNESIUM: Magnesium: 2 mg/dL (ref 1.7–2.4)

## 2020-03-30 LAB — PHOSPHORUS: Phosphorus: 3.8 mg/dL (ref 2.5–4.6)

## 2020-03-30 MED ORDER — LIDOCAINE 5 % EX PTCH
1.0000 | MEDICATED_PATCH | CUTANEOUS | Status: DC
  Administered 2020-03-30 – 2020-04-01 (×3): 1 via TRANSDERMAL
  Filled 2020-03-30 (×4): qty 1

## 2020-03-30 NOTE — Progress Notes (Signed)
Occupational Therapy Treatment Patient Details Name: Dana [name] MRN: [id number] DOB: 03/07/1946 Today's Date: 03/30/2020    History of present illness Pt s/p multiple falls with non-operative non-displaced fx of C-spine and L greater trochanter..  Pt with hx of CVA with L side residual weakness and L THR(15)   OT comments  Treatment focused on preparatory movement/skills needed for sit to stand needed for self care task;. including sitting balance, maintaining weight bearing status on LLE, leaning forward, scooting,use of upper extremities for lift off, lifting buttocks off the bed. Patient required max assist for handling techniques/tactile cues and verbal cues to elicit safe movement from patient. Patient with max assist to scoot to the left to the bed and then twice up to the head of the bed. Patient continues to be limited by sudden onsets of sharp pain in left lower extremity and difficulty not extending trunk and right lower extremity. Cont POC.    Follow Up Recommendations  SNF    Equipment Recommendations       Recommendations for Other Services      Precautions / Restrictions Precautions Precautions: Fall Required Braces or Orthoses: Cervical Brace Cervical Brace: Hard collar;At all times Restrictions Weight Bearing Restrictions: Yes LLE Weight Bearing: Touchdown weight bearing       Mobility Bed Mobility Overal bed mobility: Needs Assistance Bed Mobility: Supine to Sit;Sit to Supine     Supine to sit: +2 for physical assistance;+2 for safety/equipment;Mod assist Sit to supine: Max assist   General bed mobility comments: Max assist for sit to supine - needed assistance for trunk negotiation and bilateral lower extremities. Patinet able to lift right side of pelvis using RLE to slightly shift hips toward the middle of the bed . Max assist for bed mobility.  Transfers Overall transfer level: Needs assistance   Transfers: Lateral/Scoot Transfers           Lateral/Scoot Transfers: Max assist General transfer comment: Patient seated in recliner when therapist entered the room. Patient's legs lowered. Patient unable scoot forward in recliner requiring assistance. Patient max assist x 1 to perform 3 scoots to the left back into bed - with therapist assisting patinet with maintaining wb status, keeping RLE from extending, and tactile cues at pelvis to assist patient with lifting bottom of the bed to initiate scoot. patient holding onto therapist to achieve some pull forward. Verbal cues for patient to lean forward in preparation for mini squat. Patient performed 2 scoots up the bed to the right - with a little more difficulty keeping RLe on the the floor and lifting of her bottom off the bed.    Balance Overall balance assessment: Needs assistance Sitting-balance support: Single extremity supported Sitting balance-Leahy Scale: Poor Sitting balance - Comments: Patient asked to sit in the chair with hands on knees to maintain sitting balance. Patient had one loss of balance to the left.                                   ADL either performed or assessed with clinical judgement   ADL                                               Vision       Perception     Praxis  Cognition Arousal/Alertness: Awake/alert Behavior During Therapy: WFL for tasks assessed/performed Overall Cognitive Status: Within Functional Limits for tasks assessed                                          Exercises General Exercises - Lower Extremity Ankle Circles/Pumps: AROM;Both;10 reps;Supine Heel Slides: AAROM;Strengthening;Left;10 reps;Seated   Shoulder Instructions       General Comments Pt was able to achieve knee extension in the recliner. Positioned with pillows for optimal support and spoke with nursing tech regarding lateral +2 transfer back to bed.    Pertinent Vitals/ Pain       Pain Assessment:  0-10 Pain Score: 10-Worst pain ever Faces Pain Scale: Hurts whole lot Pain Location: L hip intermittently with movement - pt denies pain at rest Pain Descriptors / Indicators: Grimacing;Guarding;Sharp Pain Intervention(s): Limited activity within patient's tolerance;Monitored during session;Repositioned  Home Living                                          Prior Functioning/Environment              Frequency  Min 2X/week        Progress Toward Goals  OT Goals(current goals can now be found in the care plan section)        Plan Discharge plan remains appropriate    Co-evaluation          OT goals addressed during session: Other (comment)(functional mobility)      AM-PAC OT "6 Clicks" Daily Activity     Outcome Measure                    End of Session    OT Visit Diagnosis: Unsteadiness on feet (R26.81);Other abnormalities of gait and mobility (R26.89);Repeated falls (R29.6);Muscle weakness (generalized) (M62.81);Pain Pain - Right/Left: Left Pain - part of body: Hip   Activity Tolerance Patient limited by pain   Patient Left in bed;with call bell/phone within reach;with bed alarm set   Nurse Communication Mobility status        Time: 5093-2671 OT Time Calculation (min): 22 min  Charges: OT Treatments $Therapeutic Activity: 8-22 mins  Derl Barrow, OTR/L [place]  Office [phone]    Lenward Chancellor 03/30/2020, 4:21 PM

## 2020-03-30 NOTE — Plan of Care (Signed)
  Problem: Clinical Measurements: Goal: Respiratory complications will improve Outcome: Progressing   Problem: Clinical Measurements: Goal: Cardiovascular complication will be avoided Outcome: Progressing   Problem: Nutrition: Goal: Adequate nutrition will be maintained Outcome: Progressing   Problem: Coping: Goal: Level of anxiety will decrease Outcome: Progressing   Problem: Pain Managment: Goal: General experience of comfort will improve Outcome: Progressing   Problem: Safety: Goal: Ability to remain free from injury will improve Outcome: Progressing   Problem: Skin Integrity: Goal: Risk for impaired skin integrity will decrease Outcome: Progressing   

## 2020-03-30 NOTE — Progress Notes (Signed)
Physical Therapy Treatment Patient Details Name: Dana [name] MRN: [id number] DOB: Jul 10, 1946 Today's Date: 03/30/2020    History of Present Illness Pt s/p multiple falls with non-operative non-displaced fx of C-spine and L greater trochanter..  Pt with hx of CVA with L side residual weakness and L THR(15)    PT Comments    Pt presents with significant dependencies in mobility. Pt reports pain limiting her ability to mobilize. Pt is currently +2 max assist to laterally transfer to the recliner. Pt tolerated minimal LE ROM exercises. Pt is presently TTWB and at this present time due to pain level, residual weakness from previous CVA, and cervical fx, pt is unable to adhere to that WB status therefore lateral transfers were completed. Pt will continue to benefit from continued acute PT to maximize mobility and Independence for next venue of care.  Follow Up Recommendations  SNF     Equipment Recommendations  None recommended by PT    Recommendations for Other Services       Precautions / Restrictions Precautions Precautions: Fall Required Braces or Orthoses: Cervical Brace Cervical Brace: Hard collar;At all times Restrictions Weight Bearing Restrictions: Yes LLE Weight Bearing: Touchdown weight bearing    Mobility  Bed Mobility Overal bed mobility: Needs Assistance Bed Mobility: Supine to Sit;Sit to Supine     Supine to sit: +2 for physical assistance;+2 for safety/equipment;Mod assist     General bed mobility comments: Physical assist to manage LEs and to control trunk.  Pt assisted with pad to move to EOB sitting; Pt with left leg drawn up into hip and knee flexion. Pt was unable to tolerate me passively trying to extend the leg.  Transfers Overall transfer level: Needs assistance   Transfers: Lateral/Scoot Transfers          Lateral/Scoot Transfers: +2 physical assistance;Total assist;Max assist General transfer comment: completed a lateral transfer to the recliner  on pt's R side.  Ambulation/Gait                 Stairs             Wheelchair Mobility    Modified Rankin (Stroke Patients Only)       Balance Overall balance assessment: Needs assistance Sitting-balance support: Single extremity supported Sitting balance-Leahy Scale: Poor Sitting balance - Comments: Pt was able to maintain static sitting balance EOB when aligned in neutral position and feet supported                                    Cognition Arousal/Alertness: Awake/alert Behavior During Therapy: WFL for tasks assessed/performed Overall Cognitive Status: Within Functional Limits for tasks assessed                                        Exercises General Exercises - Lower Extremity Ankle Circles/Pumps: AROM;Both;10 reps;Supine Heel Slides: AAROM;Strengthening;Left;10 reps;Seated    General Comments General comments (skin integrity, edema, etc.): Pt was able to achieve knee extension in the recliner. Positioned with pillows for optimal support and spoke with nursing tech regarding lateral +2 transfer back to bed.      Pertinent Vitals/Pain Pain Assessment: 0-10 Pain Score: 10-Worst pain ever Pain Location: L hip intermittently with movement - pt denies pain at rest Pain Descriptors / Indicators: Grimacing;Guarding;Sharp Pain Intervention(s): Limited activity within patient's tolerance;Monitored during  session;Repositioned    Home Living                      Prior Function            PT Goals (current goals can now be found in the care plan section) Progress towards PT goals: Progressing toward goals    Frequency    Min 3X/week      PT Plan Current plan remains appropriate    Co-evaluation              AM-PAC PT "6 Clicks" Mobility   Outcome Measure  Help needed turning from your back to your side while in a flat bed without using bedrails?: A Lot Help needed moving from lying on your  back to sitting on the side of a flat bed without using bedrails?: A Lot Help needed moving to and from a bed to a chair (including a wheelchair)?: Total Help needed standing up from a chair using your arms (e.g., wheelchair or bedside chair)?: Total Help needed to walk in hospital room?: Total Help needed climbing 3-5 steps with a railing? : Total 6 Click Score: 8    End of Session   Activity Tolerance: Patient limited by fatigue;Patient limited by pain Patient left: in bed;with call bell/phone within reach;with chair alarm set Nurse Communication: Mobility status PT Visit Diagnosis: History of falling (Z91.81);Difficulty in walking, not elsewhere classified (R26.2);Muscle weakness (generalized) (M62.81);Other symptoms and signs involving the nervous system (R29.898)     Time: 1191-4782 PT Time Calculation (min) (ACUTE ONLY): 26 min  Charges:  $Therapeutic Exercise: 8-22 mins $Therapeutic Activity: 8-22 mins                       Greggory Stallion 03/30/2020, 2:38 PM

## 2020-03-30 NOTE — Progress Notes (Signed)
PROGRESS NOTE    Dana [name]  ZOX:[id number] DOB: 1946/05/12 DOA: 03/27/2020 PCP: Juluis Rainier, MD    Chief Complaint  Patient presents with  . Hip Pain    Brief Narrative:  [place] Severs is Dana [name] 74 y.o. female with medical history significant of HLD, anemia, depression, HTN, CVA with residual L sided weakness who presents after Dana [name] fall.   Patient notes she fell on Saturday, facedown.  She did not seek care after this, but on Tuesday, fell 3 additional times.  The 3rd time she fell on the bathroom floor and hit her left side.  She was seen in the ED and had Nashly Olsson nondisplaced fracture of her C spine.  When she got home she was not able to walk.  Monnica Saltsman neighbor had to carry her inside.  She came back to the ED today due to L lower extremity pain and inability to walk and was found to have Annett Boxwell nondisplaced fracture of the L greater trochanter.    Assessment & Plan:   Active Problems:   Fall  Acute Nondisplaced Fracture of L Greater Trochanter extending obliquely and medially into the proximal femoral diaphysis:  Dr. Eulah Pont reviewed films, plan for nonoperative management Orthopedics recommending TDWB LLE, follow up with Dr. Eulah Pont in 2-3 weeks Touchdown weight bearing  PT/OT Pain control, bowel regimen Plan for SNF when bed available  Nondisplaced Fx of the Anterior Arch of C1 with slight posterior subluxation of the L lateral Mass of C1 relative to C2:  Per EDP note from 5/18, neurosurgery recommended aspen collar and outpatient follow up  Recurrent Falls: Pt notes this is longstanding issue, follow therapy evaluation.    Depression: continue wellbutrin, abilify, zoloft, trazodone  HTN: continue norvasc, metoprolol  HLD: continue lipitor  Parkinson's disease: continue sinemet (pt denied hx of parkinson's to me, but in neurology note from 02/28/2019, appears neurology suspected parkinson's at that time as likely cause of tremor, gait/balance difficulties, and frequent falls) -  continue sinemet  Osteoporosis: fosamax on hold, follow outpatient  CVA with residual left sided weakness: aspirin/statin  Continue tizanidine and gabapentin  DVT prophylaxis: lovenox Code Status: DNR Family Communication: none at bedside Disposition:   Status is: Inpatient  Remains inpatient appropriate because:Needs continued inpatient stay for pain control, therapy, and likely placement with admission for pain/recurrent falls   Dispo: The patient is from: Home              Anticipated d/c is to: SNF              Anticipated d/c date is: 2 days              Patient currently is not medically stable to d/c.   Consultants:   orthopedics  Procedures:   none  Antimicrobials:  Anti-infectives (From admission, onward)   None      Subjective: Continued pain, no new complaints  Objective: Vitals:   03/29/20 0559 03/29/20 1401 03/29/20 2209 03/30/20 0527  BP: 123/66 107/60 111/69 128/67  Pulse: 82 80 82 71  Resp: 17 18 16 15   Temp: 98.7 F (37.1 C) 99.6 F (37.6 C) 99.2 F (37.3 C) 97.9 F (36.6 C)  TempSrc:  Oral Oral   SpO2: 94% 95% 95% 94%  Weight:      Height:        Intake/Output Summary (Last 24 hours) at 03/30/2020 1300 Last data filed at 03/30/2020 1000 Gross per 24 hour  Intake 420 ml  Output 400 ml  Net  20 ml   Filed Weights   03/27/20 1800  Weight: 51.3 kg    Examination:  General: No acute distress. Cardiovascular: Heart sounds show Dana [name] regular rate, and rhythm Lungs: Clear to auscultation bilaterally  Abdomen: Soft, nontender, nondistended Neurological: Alert and oriented 3. Moves all extremities 4. Cranial nerves II through XII grossly intact. Skin: Warm and dry. No rashes or lesions. Extremities: No clubbing or cyanosis. No edema.   Data Reviewed: I have personally reviewed following labs and imaging studies  CBC: Recent Labs  Lab 03/27/20 1133 03/28/20 0324 03/29/20 0252 03/30/20 0309  WBC 7.5 6.7 8.1 6.9  NEUTROABS 5.4   --  5.3 3.9  HGB 10.8* 10.7* 10.7* 11.0*  HCT 33.7* 33.3* 34.1* 35.2*  MCV 89.9 89.5 89.3 91.0  PLT 315 280 319 892    Basic Metabolic Panel: Recent Labs  Lab 03/27/20 1133 03/28/20 0324 03/29/20 0252 03/30/20 0309  NA 141 137 139 139  K 3.9 3.4* 3.8 4.1  CL 105 105 105 105  CO2 26 22 29 28   GLUCOSE 103* 109* 118* 104*  BUN 21 14 12 20   CREATININE 0.68 0.65 0.63 0.74  CALCIUM 9.6 8.8* 8.7* 8.8*  MG  --   --  1.9 2.0  PHOS  --   --  4.3 3.8    GFR: Estimated Creatinine Clearance: 49.2 mL/min (by C-G formula based on SCr of 0.74 mg/dL).  Liver Function Tests: Recent Labs  Lab 03/28/20 0324 03/29/20 0252 03/30/20 0309  AST 20 22 20   ALT <5 6 <5  ALKPHOS 69 71 65  BILITOT 0.6 0.8 0.5  PROT 6.4* 6.5 6.2*  ALBUMIN 3.5 3.5 3.3*    CBG: No results for input(s): GLUCAP in the last 168 hours.   Recent Results (from the past 240 hour(s))  SARS Coronavirus 2 by RT PCR (hospital order, performed in [facility] hospital lab) Nasopharyngeal Nasopharyngeal Swab     Status: None   Collection Time: 03/27/20  2:00 PM   Specimen: Nasopharyngeal Swab  Result Value Ref Range Status   SARS Coronavirus 2 NEGATIVE NEGATIVE Final    Comment: (NOTE) SARS-CoV-2 target nucleic acids are NOT DETECTED. The SARS-CoV-2 RNA is generally detectable in upper and lower respiratory specimens during the acute phase of infection. The lowest concentration of SARS-CoV-2 viral copies this assay can detect is 250 copies / mL. Dana [name] negative result does not preclude SARS-CoV-2 infection and should not be used as the sole basis for treatment or other patient management decisions.  Dietrick [name] negative result may occur with improper specimen collection / handling, submission of specimen other than nasopharyngeal swab, presence of viral mutation(s) within the areas targeted by this assay, and inadequate number of viral copies (<250 copies / mL). Dana [name] negative result must be combined with clinical observations,  patient history, and epidemiological information. Fact Sheet for Patients:   StrictlyIdeas.no Fact Sheet for Healthcare Providers: BankingDealers.co.za This test is not yet approved or cleared  by the [place] FDA and has been authorized for detection and/or diagnosis of SARS-CoV-2 by FDA under an Emergency Use Authorization (EUA).  This EUA will remain in effect (meaning this test can be used) for the duration of the COVID-19 declaration under Section 564(b)(1) of the Act, 21 U.S.C. section 360bbb-3(b)(1), unless the authorization is terminated or revoked sooner. Performed at [facility], [place] [address], [place], [place] [address]          Radiology Studies: No results found.  Scheduled Meds: . acetaminophen  1,000 mg Oral Q8H  . amLODipine  5 mg Oral BID  . ARIPiprazole  10 mg Oral Daily  . aspirin  81 mg Oral Daily  . atorvastatin  40 mg Oral Daily  . buPROPion  450 mg Oral Daily  . carbidopa-levodopa  1 tablet Oral TID  . cholecalciferol  2,000 Units Oral Daily  . gabapentin  300 mg Oral TID  . lidocaine  1 patch Transdermal Q24H  . melatonin  5 mg Oral QHS  . metoprolol tartrate  25 mg Oral BID  . multivitamin with minerals  1 tablet Oral Daily  . sertraline  200 mg Oral Daily  . tiZANidine  2 mg Oral BID  . traZODone  50 mg Oral QHS   Continuous Infusions:   LOS: 3 days    Time spent: over 30 min    Lacretia Nicks, MD [facility]   To contact the attending provider between 7A-7P or the covering provider during after hours 7P-7A, please log into the web site [email or web address] and access using universal  password for that web site. If you do not have the password, please call the hospital operator.  03/30/2020, 1:00 PM

## 2020-03-31 LAB — CBC WITH DIFFERENTIAL/PLATELET
Abs Immature Granulocytes: 0.01 10*3/uL (ref 0.00–0.07)
Basophils Absolute: 0 10*3/uL (ref 0.0–0.1)
Basophils Relative: 1 %
Eosinophils Absolute: 0.3 10*3/uL (ref 0.0–0.5)
Eosinophils Relative: 5 %
HCT: 33.4 % — ABNORMAL LOW (ref 36.0–46.0)
Hemoglobin: 10.6 g/dL — ABNORMAL LOW (ref 12.0–15.0)
Immature Granulocytes: 0 %
Lymphocytes Relative: 29 %
Lymphs Abs: 1.9 10*3/uL (ref 0.7–4.0)
MCH: 28.5 pg (ref 26.0–34.0)
MCHC: 31.7 g/dL (ref 30.0–36.0)
MCV: 89.8 fL (ref 80.0–100.0)
Monocytes Absolute: 0.5 10*3/uL (ref 0.1–1.0)
Monocytes Relative: 8 %
Neutro Abs: 3.7 10*3/uL (ref 1.7–7.7)
Neutrophils Relative %: 57 %
Platelets: 353 10*3/uL (ref 150–400)
RBC: 3.72 MIL/uL — ABNORMAL LOW (ref 3.87–5.11)
RDW: 13.8 % (ref 11.5–15.5)
WBC: 6.3 10*3/uL (ref 4.0–10.5)
nRBC: 0 % (ref 0.0–0.2)

## 2020-03-31 LAB — COMPREHENSIVE METABOLIC PANEL
ALT: <5 U/L (ref 0–44)
AST: 20 U/L (ref 15–41)
Albumin: 3.3 g/dL — ABNORMAL LOW (ref 3.5–5.0)
Alkaline Phosphatase: 72 U/L (ref 38–126)
Anion gap: 11 (ref 5–15)
BUN: 19 mg/dL (ref 8–23)
CO2: 24 mmol/L (ref 22–32)
Calcium: 9 mg/dL (ref 8.9–10.3)
Chloride: 105 mmol/L (ref 98–111)
Creatinine, Ser: 0.67 mg/dL (ref 0.44–1.00)
GFR calc Af Amer: >60 mL/min (ref >60)
GFR calc non Af Amer: >60 mL/min (ref >60)
Glucose, Bld: 103 mg/dL — ABNORMAL HIGH (ref 70–99)
Potassium: 4.2 mmol/L (ref 3.5–5.1)
Sodium: 140 mmol/L (ref 135–145)
Total Bilirubin: 0.4 mg/dL (ref 0.3–1.2)
Total Protein: 6.3 g/dL — ABNORMAL LOW (ref 6.5–8.1)

## 2020-03-31 LAB — MAGNESIUM: Magnesium: 1.9 mg/dL (ref 1.7–2.4)

## 2020-03-31 LAB — PHOSPHORUS: Phosphorus: 4.1 mg/dL (ref 2.5–4.6)

## 2020-03-31 MED ORDER — KETOROLAC TROMETHAMINE 15 MG/ML IJ SOLN
15.0000 mg | Freq: Four times a day (QID) | INTRAMUSCULAR | Status: DC
  Administered 2020-03-31 – 2020-04-02 (×9): 15 mg via INTRAVENOUS
  Filled 2020-03-31 (×9): qty 1

## 2020-03-31 NOTE — Plan of Care (Signed)
  Problem: Clinical Measurements: Goal: Respiratory complications will improve Outcome: Progressing   Problem: Clinical Measurements: Goal: Cardiovascular complication will be avoided Outcome: Progressing   Problem: Nutrition: Goal: Adequate nutrition will be maintained Outcome: Progressing   Problem: Coping: Goal: Level of anxiety will decrease Outcome: Progressing   Problem: Elimination: Goal: Will not experience complications related to bowel motility Outcome: Progressing   Problem: Safety: Goal: Ability to remain free from injury will improve Outcome: Progressing   

## 2020-03-31 NOTE — Progress Notes (Signed)
PROGRESS NOTE    Dana [name]  WPY:[id number] DOB: [birth year] DOA: 03/27/2020 PCP: Juluis Rainier, MD    Chief Complaint  Patient presents with  . Hip Pain    Brief Narrative:  Dana [name] is Dana [name] 74 y.o. female with medical history significant of HLD, anemia, depression, HTN, CVA with residual L sided weakness who presents after Dana [name] fall.   Patient notes she fell on Saturday, facedown.  She did not seek care after this, but on Tuesday, fell 3 additional times.  The 3rd time she fell on the bathroom floor and hit her left side.  She was seen in the ED and had Dana [name] nondisplaced fracture of her C spine.  When she got home she was not able to walk.  Dana [name] neighbor had to carry her inside.  She came back to the ED today due to L lower extremity pain and inability to walk and was found to have Dana [name] nondisplaced fracture of the L greater trochanter.    Assessment & Plan:   Active Problems:   Fall   Peri-prosthetic fracture around prosthetic hip  Acute Nondisplaced Fracture of L Greater Trochanter extending obliquely and medially into the proximal femoral diaphysis:  Dr. Eulah Pont reviewed films, plan for nonoperative management Orthopedics recommending TDWB LLE, follow up with Dr. Eulah Pont in 2-3 weeks Touchdown weight bearing  PT/OT Pain control, bowel regimen - she's reluctant to use opiates, encourage use as needed -schedule toradol Plan for SNF when bed available  Nondisplaced Fx of the Anterior Arch of C1 with slight posterior subluxation of the L lateral Mass of C1 relative to C2:  Per EDP note from 5/18, neurosurgery recommended aspen collar and outpatient follow up  Recurrent Falls: Pt notes this is longstanding issue, follow therapy evaluation.    Depression: continue wellbutrin, abilify, zoloft, trazodone  HTN: continue norvasc, metoprolol  HLD: continue lipitor  Parkinson's disease: continue sinemet (pt denied hx of parkinson's to me, but in neurology note from 02/28/2019,  appears neurology suspected parkinson's at that time as likely cause of tremor, gait/balance difficulties, and frequent falls) - continue sinemet  Osteoporosis: fosamax on hold, follow outpatient  CVA with residual left sided weakness: aspirin/statin  Continue tizanidine and gabapentin  DVT prophylaxis: lovenox Code Status: DNR Family Communication: none at bedside Disposition:   Status is: Inpatient  Remains inpatient appropriate because:Needs continued inpatient stay for pain control, therapy, and likely placement with admission for pain/recurrent falls   Dispo: The patient is from: Home              Anticipated d/c is to: SNF              Anticipated d/c date is: 2 days              Patient currently is not medically stable to d/c.   Consultants:   orthopedics  Procedures:   none  Antimicrobials:  Anti-infectives (From admission, onward)   None      Subjective: No new complaints  Objective: Vitals:   03/30/20 1338 03/30/20 2025 03/31/20 0533 03/31/20 1336  BP: 101/66 123/66 (!) 116/58 (!) 100/48  Pulse: 74 81 75 72  Resp: 18 16 16 16   Temp: 98.7 F (37.1 C) 98.3 F (36.8 C) 98.2 F (36.8 C) 99 F (37.2 C)  TempSrc:  Oral Oral Oral  SpO2: 97% 96% 94% 96%  Weight:      Height:        Intake/Output Summary (Last 24 hours) at 03/31/2020 1342 Last  data filed at 03/31/2020 1242 Gross per 24 hour  Intake 1380 ml  Output 2000 ml  Net -620 ml   Filed Weights   03/27/20 1800  Weight: 51.3 kg    Examination:  General: No acute distress. Cardiovascular: Heart sounds show Dana [name] regular rate, and rhythm Lungs: Clear to auscultation bilaterally Abdomen: Soft, nontender, nondistended  Neurological: Alert and oriented 3. Moves all extremities 4. Cranial nerves II through XII grossly intact. Skin: Warm and dry. No rashes or lesions. Extremities: No clubbing or cyanosis. No edema.   Data Reviewed: I have personally reviewed following labs and imaging  studies  CBC: Recent Labs  Lab 03/27/20 1133 03/28/20 0324 03/29/20 0252 03/30/20 0309 03/31/20 0259  WBC 7.5 6.7 8.1 6.9 6.3  NEUTROABS 5.4  --  5.3 3.9 3.7  HGB 10.8* 10.7* 10.7* 11.0* 10.6*  HCT 33.7* 33.3* 34.1* 35.2* 33.4*  MCV 89.9 89.5 89.3 91.0 89.8  PLT 315 280 319 340 353    Basic Metabolic Panel: Recent Labs  Lab 03/27/20 1133 03/28/20 0324 03/29/20 0252 03/30/20 0309 03/31/20 0259  NA 141 137 139 139 140  K 3.9 3.4* 3.8 4.1 4.2  CL 105 105 105 105 105  CO2 26 22 29 28 24   GLUCOSE 103* 109* 118* 104* 103*  BUN 21 14 12 20 19   CREATININE 0.68 0.65 0.63 0.74 0.67  CALCIUM 9.6 8.8* 8.7* 8.8* 9.0  MG  --   --  1.9 2.0 1.9  PHOS  --   --  4.3 3.8 4.1    GFR: Estimated Creatinine Clearance: 49.2 mL/min (by C-G formula based on SCr of 0.67 mg/dL).  Liver Function Tests: Recent Labs  Lab 03/28/20 0324 03/29/20 0252 03/30/20 0309 03/31/20 0259  AST 20 22 20 20   ALT <5 6 <5 <5  ALKPHOS 69 71 65 72  BILITOT 0.6 0.8 0.5 0.4  PROT 6.4* 6.5 6.2* 6.3*  ALBUMIN 3.5 3.5 3.3* 3.3*    CBG: No results for input(s): GLUCAP in the last 168 hours.   Recent Results (from the past 240 hour(s))  SARS Coronavirus 2 by RT PCR (hospital order, performed in [facility] hospital lab) Nasopharyngeal Nasopharyngeal Swab     Status: None   Collection Time: 03/27/20  2:00 PM   Specimen: Nasopharyngeal Swab  Result Value Ref Range Status   SARS Coronavirus 2 NEGATIVE NEGATIVE Final    Comment: (NOTE) SARS-CoV-2 target nucleic acids are NOT DETECTED. The SARS-CoV-2 RNA is generally detectable in upper and lower respiratory specimens during the acute phase of infection. The lowest concentration of SARS-CoV-2 viral copies this assay can detect is 250 copies / mL. Dana [name] negative result does not preclude SARS-CoV-2 infection and should not be used as the sole basis for treatment or other patient management decisions.  Dana [name] negative result may occur with improper specimen  collection / handling, submission of specimen other than nasopharyngeal swab, presence of viral mutation(s) within the areas targeted by this assay, and inadequate number of viral copies (<250 copies / mL). Dana [name] negative result must be combined with clinical observations, patient history, and epidemiological information. Fact Sheet for Patients:   Fact Sheet for Healthcare Providers: [facility] This test is not yet approved or cleared  by the 03/29/20 FDA and has been authorized for detection and/or diagnosis of SARS-CoV-2 by FDA under an Emergency Use Authorization (EUA).  This EUA will remain in effect (meaning this test can be used) for the duration of the COVID-19 declaration under Section  564(b)(1) of the Act, 21 U.S.C. section 360bbb-3(b)(1), unless the authorization is terminated or revoked sooner. Performed at [facility], [place] [address], [place], [place] [address]          Radiology Studies: No results found.      Scheduled Meds: . acetaminophen  1,000 mg Oral Q8H  . amLODipine  5 mg Oral BID  . ARIPiprazole  10 mg Oral Daily  . aspirin  81 mg Oral Daily  . atorvastatin  40 mg Oral Daily  . buPROPion  450 mg Oral Daily  . carbidopa-levodopa  1 tablet Oral TID  . cholecalciferol  2,000 Units Oral Daily  . gabapentin  300 mg Oral TID  . lidocaine  1 patch Transdermal Q24H  . melatonin  5 mg Oral QHS  . metoprolol tartrate  25 mg Oral BID  . multivitamin with minerals  1 tablet Oral Daily  . sertraline  200 mg Oral Daily  . tiZANidine  2 mg Oral BID  . traZODone  50 mg Oral QHS   Continuous Infusions:   LOS: 4 days    Time spent: over 30 min    Fayrene Helper, MD [facility]   To contact the attending provider between 7A-7P or the covering provider during after hours 7P-7A, please log into the web site [email or web address] and access using universal [facility] password for that web site. If you do not have the password, please call the hospital operator.  03/31/2020, 1:42 PM

## 2020-04-01 LAB — COMPREHENSIVE METABOLIC PANEL
ALT: 7 U/L (ref 0–44)
AST: 27 U/L (ref 15–41)
Albumin: 3.2 g/dL — ABNORMAL LOW (ref 3.5–5.0)
Alkaline Phosphatase: 75 U/L (ref 38–126)
Anion gap: 7 (ref 5–15)
BUN: 29 mg/dL — ABNORMAL HIGH (ref 8–23)
CO2: 29 mmol/L (ref 22–32)
Calcium: 8.7 mg/dL — ABNORMAL LOW (ref 8.9–10.3)
Chloride: 104 mmol/L (ref 98–111)
Creatinine, Ser: 0.93 mg/dL (ref 0.44–1.00)
GFR calc Af Amer: >60 mL/min (ref >60)
GFR calc non Af Amer: >60 mL/min (ref >60)
Glucose, Bld: 101 mg/dL — ABNORMAL HIGH (ref 70–99)
Potassium: 4.5 mmol/L (ref 3.5–5.1)
Sodium: 140 mmol/L (ref 135–145)
Total Bilirubin: 0.5 mg/dL (ref 0.3–1.2)
Total Protein: 6.3 g/dL — ABNORMAL LOW (ref 6.5–8.1)

## 2020-04-01 LAB — MAGNESIUM: Magnesium: 2.1 mg/dL (ref 1.7–2.4)

## 2020-04-01 LAB — CBC WITH DIFFERENTIAL/PLATELET
Abs Immature Granulocytes: 0.02 10*3/uL (ref 0.00–0.07)
Basophils Absolute: 0 10*3/uL (ref 0.0–0.1)
Basophils Relative: 1 %
Eosinophils Absolute: 0.4 10*3/uL (ref 0.0–0.5)
Eosinophils Relative: 5 %
HCT: 34.6 % — ABNORMAL LOW (ref 36.0–46.0)
Hemoglobin: 11 g/dL — ABNORMAL LOW (ref 12.0–15.0)
Immature Granulocytes: 0 %
Lymphocytes Relative: 31 %
Lymphs Abs: 2.3 10*3/uL (ref 0.7–4.0)
MCH: 28.9 pg (ref 26.0–34.0)
MCHC: 31.8 g/dL (ref 30.0–36.0)
MCV: 90.8 fL (ref 80.0–100.0)
Monocytes Absolute: 0.6 10*3/uL (ref 0.1–1.0)
Monocytes Relative: 8 %
Neutro Abs: 4.1 10*3/uL (ref 1.7–7.7)
Neutrophils Relative %: 55 %
Platelets: 363 10*3/uL (ref 150–400)
RBC: 3.81 MIL/uL — ABNORMAL LOW (ref 3.87–5.11)
RDW: 13.7 % (ref 11.5–15.5)
WBC: 7.5 10*3/uL (ref 4.0–10.5)
nRBC: 0 % (ref 0.0–0.2)

## 2020-04-01 LAB — PHOSPHORUS: Phosphorus: 4.8 mg/dL — ABNORMAL HIGH (ref 2.5–4.6)

## 2020-04-01 LAB — SARS CORONAVIRUS 2 BY RT PCR (HOSPITAL ORDER, PERFORMED IN ~~LOC~~ HOSPITAL LAB): SARS Coronavirus 2: NEGATIVE

## 2020-04-01 NOTE — NC FL2 (Deleted)
[place] LEVEL OF CARE SCREENING TOOL     IDENTIFICATION  Patient Name: Dana [name] Birthdate: [birth year] Sex: female Admission Date (Current Location): 03/27/2020  [facility] and [place] Number:  Herbalist and Address:  [facility],  [place], [place]      Provider Number: [id number]  Attending Physician Name and Address:  Elodia Florence., *  Relative Name and Phone Number:    Gillermina Phy [phone]       Current Level of Care: Hospital Recommended Level of Care: [place] Prior Approval Number:    Date Approved/Denied:   PASRR Number: [id number]  Discharge Plan: SNF    Current Diagnoses: Patient Active Problem List   Diagnosis Date Noted  . Peri-prosthetic fracture around prosthetic hip   . Fall 03/27/2020  . Metabolic acidosis 11/11/7251  . Acute encephalopathy 10/04/2019  . Hyponatremia 10/04/2019  . Hypokalemia 10/04/2019  . Hypernatremia 10/04/2019  . Polyneuropathy 10/04/2019  . Insomnia 09/07/2018  . Major depressive disorder 07/25/2018  . GAD (generalized anxiety disorder) 07/25/2018  . Hip fracture, left ([place]) 09/15/2014  . Hip fracture requiring operative repair ([place]) 09/15/2014  . Hip fracture ([place]) 09/15/2014  . Chronic back pain 09/15/2014  . History of stroke   . Hypertension   . Hyperlipidemia   . Essential hypertension   . Spastic hemiplegia affecting nondominant side ([facility]) 01/12/2012    Orientation RESPIRATION BLADDER Height & Weight     Self, Time, Situation, Place  Normal Continent Weight: 113 lb (51.3 kg) Height:  5\' 3"  (160 cm)  BEHAVIORAL SYMPTOMS/MOOD NEUROLOGICAL BOWEL NUTRITION STATUS      Continent Diet  AMBULATORY STATUS COMMUNICATION OF NEEDS Skin   Limited Assist Verbally Normal                       Personal Care Assistance Level of Assistance  Bathing, Dressing Bathing Assistance: Limited assistance   Dressing  Assistance: Limited assistance     Functional Limitations Info  Sight          SPECIAL CARE FACTORS FREQUENCY  PT (By licensed PT), OT (By licensed OT)     PT Frequency: 5x/week OT Frequency: 5x/w            Contractures Contractures Info: Not present    Additional Factors Info  Allergies, Psychotropic Code Status Info: DNR Allergies Info: Allergies: Percodan Oxycodone-aspirin, Oxycodone Psychotropic Info: Trazadone, Abilify, Wellbutrin         Current Medications (04/01/2020):  This is the current hospital active medication list Current Facility-Administered Medications  Medication Dose Route Frequency Provider Last Rate Last Admin  . acetaminophen (TYLENOL) tablet 1,000 mg  1,000 mg Oral Q8H Elodia Florence., MD   1,000 mg at 04/01/20 6644  . amLODipine (NORVASC) tablet 5 mg  5 mg Oral BID Elodia Florence., MD   5 mg at 03/31/20 0830  . ARIPiprazole (ABILIFY) tablet 10 mg  10 mg Oral Daily Elodia Florence., MD   10 mg at 03/31/20 0347  . aspirin chewable tablet 81 mg  81 mg Oral Daily Elodia Florence., MD   81 mg at 03/31/20 4259  . atorvastatin (LIPITOR) tablet 40 mg  40 mg Oral Daily Elodia Florence., MD   40 mg at 03/31/20 0830  . buPROPion (WELLBUTRIN XL) 24 hr tablet 450 mg  450 mg Oral Daily Elodia Florence., MD  450 mg at 03/31/20 0829  . carbidopa-levodopa (SINEMET IR) 25-100 MG per tablet immediate release 1 tablet  1 tablet Oral TID Zigmund Daniel., MD   1 tablet at 03/31/20 2206  . cholecalciferol (VITAMIN D3) tablet 2,000 Units  2,000 Units Oral Daily Zigmund Daniel., MD   2,000 Units at 03/31/20 0830  . gabapentin (NEURONTIN) capsule 300 mg  300 mg Oral TID Zigmund Daniel., MD   300 mg at 03/31/20 2206  . ketorolac (TORADOL) 15 MG/ML injection 15 mg  15 mg Intravenous Q6H Zigmund Daniel., MD   15 mg at 04/01/20 6599  . lidocaine (LIDODERM) 5 % 1 patch  1 patch Transdermal Q24H Zigmund Daniel., MD   1 patch at 03/31/20 1310  . melatonin tablet 5 mg  5 mg Oral QHS Zigmund Daniel., MD   5 mg at 03/31/20 2204  . metoprolol tartrate (LOPRESSOR) tablet 25 mg  25 mg Oral BID Zigmund Daniel., MD   25 mg at 03/31/20 2206  . multivitamin with minerals tablet 1 tablet  1 tablet Oral Daily Zigmund Daniel., MD   1 tablet at 03/31/20 [phone]  . oxyCODONE (Oxy IR/ROXICODONE) immediate release tablet 2.5 mg  2.5 mg Oral Q4H PRN Zigmund Daniel., MD   2.5 mg at 03/28/20 1316   Or  . oxyCODONE (Oxy IR/ROXICODONE) immediate release tablet 5 mg  5 mg Oral Q4H PRN Zigmund Daniel., MD   5 mg at 03/28/20 0407  . polyethylene glycol (MIRALAX / GLYCOLAX) packet 17 g  17 g Oral Daily PRN Zigmund Daniel., MD      . sertraline (ZOLOFT) tablet 200 mg  200 mg Oral Daily Zigmund Daniel., MD   200 mg at 03/31/20 0829  . tiZANidine (ZANAFLEX) tablet 2 mg  2 mg Oral BID Zigmund Daniel., MD   2 mg at 03/31/20 2204  . traZODone (DESYREL) tablet 50 mg  50 mg Oral QHS Zigmund Daniel., MD   50 mg at 03/31/20 2204     Discharge Medications: Please see discharge summary for a list of discharge medications.  Relevant Imaging Results:  Relevant Lab Results:   Additional Information ssn: [id number]  Clearance Coots, LCSW

## 2020-04-01 NOTE — TOC Initial Note (Addendum)
Transition of Care [facility]) - Initial/Assessment Note    Patient Details  Name: Dana [name] MRN: [id number] Date of Birth: [birth year]  Transition of Care [facility]) CM/SW Contact:    Lia Hopping, [place] Phone Number: 04/01/2020, 10:52 AM  Clinical Narrative:    Patient admitted for left sided weakness who presents after a fall. Patient has a nondisplaced fracture of her C spine and greater trochanter.               CSW met with the patient at bedside to discuss rehab placement at [facility]. Patient agreeable to placement. CSW notified the patient [facility] will have a bed for the patient tomorrow pending authorization approval and covid results. Patient report understanding. Patient lives at home alone is independent with ADL's and drives. She hopeful to return home after rehab. Patient reports her son and daughter in law live in [place]. She has friends that are local that help and provide support.   [employer] Medicare-[place] Auth. started.  Reference # [id number]  Expected Discharge Plan: Skilled Nursing Facility Barriers to Discharge: Other (comment), Insurance Authorization(Covid TEST)   Patient Goals and CMS Choice Patient states their goals for this hospitalization and ongoing recovery are:: "I want to get up and walk again, I was almost done with my home health PT before I fell again." CMS Medicare.gov Compare Post Acute Care list provided to:: Patient Choice offered to / list presented to : Patient  Expected Discharge Plan and Services Expected Discharge Plan: [place] In-house Referral: Clinical Social Work Discharge Planning Services: NA Post Acute Care Choice: [place] Living arrangements for the past 2 months: Single Family Home                 DME Arranged: N/A DME Agency: NA       HH Arranged: NA [place] Agency: NA        Prior Living Arrangements/Services Living arrangements for the past 2 months: [place] with::  Self Patient language and need for interpreter reviewed:: No Do you feel safe going back to the place where you live?: Yes      Need for Family Participation in Patient Care: No (Comment) Care giver support system in place?: Yes (comment) Current home services: Home PT, Home OT Criminal Activity/Legal Involvement Pertinent to Current Situation/Hospitalization: No - Comment as needed  Activities of Daily Living Home Assistive Devices/Equipment: Grab bars in shower, Walker (specify type), Cane (specify quad or straight), Eyeglasses, Blood pressure cuff, Scales, Brace (specify type)(single point cane, front wheeled walker, neck brace) ADL Screening (condition at time of admission) Patient's cognitive ability adequate to safely complete daily activities?: Yes Is the patient deaf or have difficulty hearing?: No Does the patient have difficulty seeing, even when wearing glasses/contacts?: No Does the patient have difficulty concentrating, remembering, or making decisions?: No Patient able to express need for assistance with ADLs?: Yes Does the patient have difficulty dressing or bathing?: Yes Independently performs ADLs?: No Communication: Independent Dressing (OT): Needs assistance Is this a change from baseline?: Change from baseline, expected to last >3 days Grooming: Needs assistance Is this a change from baseline?: Change from baseline, expected to last >3 days Feeding: Needs assistance Is this a change from baseline?: Change from baseline, expected to last >3 days Bathing: Needs assistance Is this a change from baseline?: Change from baseline, expected to last >3 days Toileting: Dependent Is this a change from baseline?: Change from baseline, expected to last >3days In/Out Bed:  Dependent Is this a change from baseline?: Change from baseline, expected to last >3 days Walks in Home: Dependent Is this a change from baseline?: Change from baseline, expected to last >3 days Does the  patient have difficulty walking or climbing stairs?: Yes Weakness of Legs: Left Weakness of Arms/Hands: None  Permission Sought/Granted Permission sought to share information with : Case Manager, Customer service manager Permission granted to share information with : Yes, Verbal Permission Granted     Permission granted to share info w AGENCY: SNFs in the areaa        Emotional Assessment Appearance:: Appears stated age Attitude/Demeanor/Rapport: Engaged Affect (typically observed): Accepting Orientation: : Oriented to Self, Oriented to Place, Oriented to  Time, Oriented to Situation Alcohol / Substance Use: Not Applicable Psych Involvement: No (comment)  Admission diagnosis:  Preop examination [Z01.818] Fall [W19.XXXA] Periprosthetic fracture of hip, initial encounter [W10.8XXA, [place] Patient Active Problem List   Diagnosis Date Noted  . Peri-prosthetic fracture around prosthetic hip   . Fall 03/27/2020  . Metabolic acidosis 93/23/5573  . Acute encephalopathy 10/04/2019  . Hyponatremia 10/04/2019  . Hypokalemia 10/04/2019  . Hypernatremia 10/04/2019  . Polyneuropathy 10/04/2019  . Insomnia 09/07/2018  . Major depressive disorder 07/25/2018  . GAD (generalized anxiety disorder) 07/25/2018  . Hip fracture, left ([place]) 09/15/2014  . Hip fracture requiring operative repair ([place]) 09/15/2014  . Hip fracture ([place]) 09/15/2014  . Chronic back pain 09/15/2014  . History of stroke   . Hypertension   . Hyperlipidemia   . Essential hypertension   . Spastic hemiplegia affecting nondominant side ([place]) 01/12/2012   PCP:  Leighton Ruff, MD Pharmacy:   [id number] - [place], [place] - [address] AT [place] [address] [place] [address] Phone: [phone] Fax: [phone]  [place], [place] [facility] [address] [place] [address] [place] [address] Phone: [phone]  Fax: [place] [address], [place] [address] [place] [address] Phone: [phone] Fax: [phone]     Social Determinants of Health (SDOH) Interventions    Readmission Risk Interventions No flowsheet data found.

## 2020-04-01 NOTE — NC FL2 (Signed)
[place] LEVEL OF CARE SCREENING TOOL     IDENTIFICATION  Patient Name: Dana [name] Birthdate: 22-Dec-1945 Sex: female Admission Date (Current Location): 03/27/2020  [facility] and [place] Number:  Herbalist and Address:  [facility],  [place], [place]      Provider Number: [id number]  Attending Physician Name and Address:  Elodia Florence., *  Relative Name and Phone Number:       Current Level of Care: Hospital Recommended Level of Care: [place] Prior Approval Number:    Date Approved/Denied:   PASRR Number: [id number]  Discharge Plan: SNF    Current Diagnoses: Patient Active Problem List   Diagnosis Date Noted  . Peri-prosthetic fracture around prosthetic hip   . Fall 03/27/2020  . Metabolic acidosis 05/39/7673  . Acute encephalopathy 10/04/2019  . Hyponatremia 10/04/2019  . Hypokalemia 10/04/2019  . Hypernatremia 10/04/2019  . Polyneuropathy 10/04/2019  . Insomnia 09/07/2018  . Major depressive disorder 07/25/2018  . GAD (generalized anxiety disorder) 07/25/2018  . Hip fracture, left ([place]) 09/15/2014  . Hip fracture requiring operative repair ([place]) 09/15/2014  . Hip fracture ([place]) 09/15/2014  . Chronic back pain 09/15/2014  . History of stroke   . Hypertension   . Hyperlipidemia   . Essential hypertension   . Spastic hemiplegia affecting nondominant side ([facility]) 01/12/2012    Orientation RESPIRATION BLADDER Height & Weight     Self, Time, Situation, Place  Normal Continent Weight: 113 lb (51.3 kg) Height:  5\' 3"  (160 cm)  BEHAVIORAL SYMPTOMS/MOOD NEUROLOGICAL BOWEL NUTRITION STATUS      Continent (Regular)  AMBULATORY STATUS COMMUNICATION OF NEEDS Skin   Extensive Assist Verbally Normal                       Personal Care Assistance Level of Assistance  Bathing, Feeding, Dressing Bathing Assistance: Limited assistance Feeding assistance: Independent Dressing  Assistance: Limited assistance     Functional Limitations Info  Sight, Hearing, Speech Sight Info: Impaired Hearing Info: Adequate Speech Info: Adequate    SPECIAL CARE FACTORS FREQUENCY  PT (By licensed PT), OT (By licensed OT)     PT Frequency: 5x/week OT Frequency: 5x/w            Contractures Contractures Info: Not present    Additional Factors Info  Allergies, Psychotropic Code Status Info: DNR Allergies Info: Allergies: Percodan Oxycodone-aspirin, Oxycodone Psychotropic Info: Trazadone, Abilify, Wellbutrin         Current Medications (04/01/2020):  This is the current hospital active medication list Current Facility-Administered Medications  Medication Dose Route Frequency Provider Last Rate Last Admin  . acetaminophen (TYLENOL) tablet 1,000 mg  1,000 mg Oral Q8H Elodia Florence., MD   1,000 mg at 04/01/20 4193  . amLODipine (NORVASC) tablet 5 mg  5 mg Oral BID Elodia Florence., MD   5 mg at 04/01/20 [phone]  . ARIPiprazole (ABILIFY) tablet 10 mg  10 mg Oral Daily Elodia Florence., MD   10 mg at 04/01/20 0941  . aspirin chewable tablet 81 mg  81 mg Oral Daily Elodia Florence., MD   81 mg at 04/01/20 0941  . atorvastatin (LIPITOR) tablet 40 mg  40 mg Oral Daily Elodia Florence., MD   40 mg at 04/01/20 [phone]  . buPROPion (WELLBUTRIN XL) 24 hr tablet 450 mg  450 mg Oral Daily Elodia Florence., MD  450 mg at 04/01/20 0941  . carbidopa-levodopa (SINEMET IR) 25-100 MG per tablet immediate release 1 tablet  1 tablet Oral TID Zigmund Daniel., MD   1 tablet at 04/01/20 0941  . cholecalciferol (VITAMIN D3) tablet 2,000 Units  2,000 Units Oral Daily Zigmund Daniel., MD   2,000 Units at 04/01/20 [phone]  . gabapentin (NEURONTIN) capsule 300 mg  300 mg Oral TID Zigmund Daniel., MD   300 mg at 04/01/20 0941  . ketorolac (TORADOL) 15 MG/ML injection 15 mg  15 mg Intravenous Q6H Zigmund Daniel., MD   15 mg at 04/01/20 6811  .  lidocaine (LIDODERM) 5 % 1 patch  1 patch Transdermal Q24H Zigmund Daniel., MD   1 patch at 03/31/20 1310  . melatonin tablet 5 mg  5 mg Oral QHS Zigmund Daniel., MD   5 mg at 03/31/20 2204  . metoprolol tartrate (LOPRESSOR) tablet 25 mg  25 mg Oral BID Zigmund Daniel., MD   25 mg at 04/01/20 [phone]  . multivitamin with minerals tablet 1 tablet  1 tablet Oral Daily Zigmund Daniel., MD   1 tablet at 04/01/20 [phone]  . oxyCODONE (Oxy IR/ROXICODONE) immediate release tablet 2.5 mg  2.5 mg Oral Q4H PRN Zigmund Daniel., MD   2.5 mg at 03/28/20 1316   Or  . oxyCODONE (Oxy IR/ROXICODONE) immediate release tablet 5 mg  5 mg Oral Q4H PRN Zigmund Daniel., MD   5 mg at 03/28/20 0407  . polyethylene glycol (MIRALAX / GLYCOLAX) packet 17 g  17 g Oral Daily PRN Zigmund Daniel., MD      . sertraline (ZOLOFT) tablet 200 mg  200 mg Oral Daily Zigmund Daniel., MD   200 mg at 04/01/20 [phone]  . tiZANidine (ZANAFLEX) tablet 2 mg  2 mg Oral BID Zigmund Daniel., MD   2 mg at 04/01/20 0941  . traZODone (DESYREL) tablet 50 mg  50 mg Oral QHS Zigmund Daniel., MD   50 mg at 03/31/20 2204     Discharge Medications: Please see discharge summary for a list of discharge medications.  Relevant Imaging Results:  Relevant Lab Results:   Additional Information ssn: [id number]  Clearance Coots, LCSW

## 2020-04-01 NOTE — Progress Notes (Signed)
PROGRESS NOTE    Dana [name]  ZGY:[id number] DOB: 1946-09-21 DOA: 03/27/2020 PCP: Juluis Rainier, MD    Chief Complaint  Patient presents with  . Hip Pain    Brief Narrative:  Dana [name] is Dana [name] 74 y.o. female with medical history significant of HLD, anemia, depression, HTN, CVA with residual L sided weakness who presents after [name] fall.   Patient notes she fell on Saturday, facedown.  She did not seek care after this, but on Tuesday, fell 3 additional times.  The 3rd time she fell on the bathroom floor and hit her left side.  She was seen in the ED and had Austine Wiedeman nondisplaced fracture of her C spine.  When she got home she was not able to walk.  Annahi Short neighbor had to carry her inside.  She came back to the ED today due to L lower extremity pain and inability to walk and was found to have [name] nondisplaced fracture of the L greater trochanter.    Assessment & Plan:   Active Problems:   Fall   Peri-prosthetic fracture around prosthetic hip  Acute Nondisplaced Fracture of L Greater Trochanter extending obliquely and medially into the proximal femoral diaphysis:  Dr. Eulah Pont reviewed films, plan for nonoperative management Orthopedics recommending TDWB LLE, follow up with Dr. Eulah Pont in 2-3 weeks Touchdown weight bearing  PT/OT Pain control, bowel regimen - she's reluctant to use opiates, encourage use as needed - schedule toradol Plan for SNF when bed available  Nondisplaced Fx of the Anterior Arch of C1 with slight posterior subluxation of the L lateral Mass of C1 relative to C2:  Per EDP note from 5/18, neurosurgery recommended aspen collar and outpatient follow up  Recurrent Falls: Pt notes this is longstanding issue, follow therapy evaluation.    Depression: continue wellbutrin, abilify, zoloft, trazodone  HTN: continue norvasc, metoprolol  HLD: continue lipitor  Parkinson's disease: continue sinemet (pt denied hx of parkinson's to me, but in neurology note from 02/28/2019,  appears neurology suspected parkinson's at that time as likely cause of tremor, gait/balance difficulties, and frequent falls) - continue sinemet  Osteoporosis: fosamax on hold, follow outpatient  CVA with residual left sided weakness: aspirin/statin  Continue tizanidine and gabapentin  DVT prophylaxis: lovenox Code Status: DNR Family Communication: none at bedside Disposition:   Status is: Inpatient  Remains inpatient appropriate because:Needs continued inpatient stay for pain control, therapy, and likely placement with admission for pain/recurrent falls   Dispo: The patient is from: Home              Anticipated d/c is to: SNF              Anticipated d/c date is: 2 days              Patient currently is not medically stable to d/c.   Consultants:   orthopedics  Procedures:   none  Antimicrobials:  Anti-infectives (From admission, onward)   None      Subjective: No new complaints today Pain better with toradol  Objective: Vitals:   03/31/20 0533 03/31/20 1336 03/31/20 2135 04/01/20 0635  BP: (!) 116/58 (!) 100/48 (!) 119/58 131/66  Pulse: 75 72 72 70  Resp: 16 16 18 16   Temp: 98.2 F (36.8 C) 99 F (37.2 C) 98.3 F (36.8 C) 98.1 F (36.7 C)  TempSrc: Oral Oral Oral Oral  SpO2: 94% 96% 97% 94%  Weight:      Height:        Intake/Output Summary (  Last 24 hours) at 04/01/2020 1325 Last data filed at 04/01/2020 1234 Gross per 24 hour  Intake 990 ml  Output 975 ml  Net 15 ml   Filed Weights   03/27/20 1800  Weight: 51.3 kg    Examination:  General: No acute distress. Cardiovascular: Heart sounds show Wayde Gopaul regular rate, and rhythm.  Lungs: Clear to auscultation bilaterally  Abdomen: Soft, nontender, nondistended  Neurological: Alert and oriented 3. Moves all extremities 4 with equal strength. Cranial nerves II through XII grossly intact. Skin: Warm and dry. No rashes or lesions. Extremities: L hip TTP    Data Reviewed: I have personally  reviewed following labs and imaging studies  CBC: Recent Labs  Lab 03/27/20 1133 03/27/20 1133 03/28/20 0324 03/29/20 0252 03/30/20 0309 03/31/20 0259 04/01/20 0333  WBC 7.5   < > 6.7 8.1 6.9 6.3 7.5  NEUTROABS 5.4  --   --  5.3 3.9 3.7 4.1  HGB 10.8*   < > 10.7* 10.7* 11.0* 10.6* 11.0*  HCT 33.7*   < > 33.3* 34.1* 35.2* 33.4* 34.6*  MCV 89.9   < > 89.5 89.3 91.0 89.8 90.8  PLT 315   < > 280 319 340 353 363   < > = values in this interval not displayed.    Basic Metabolic Panel: Recent Labs  Lab 03/28/20 0324 03/29/20 0252 03/30/20 0309 03/31/20 0259 04/01/20 0333  NA 137 139 139 140 140  K 3.4* 3.8 4.1 4.2 4.5  CL 105 105 105 105 104  CO2 22 29 28 24 29   GLUCOSE 109* 118* 104* 103* 101*  BUN 14 12 20 19  29*  CREATININE 0.65 0.63 0.74 0.67 0.93  CALCIUM 8.8* 8.7* 8.8* 9.0 8.7*  MG  --  1.9 2.0 1.9 2.1  PHOS  --  4.3 3.8 4.1 4.8*    GFR: Estimated Creatinine Clearance: 42.3 mL/min (by C-G formula based on SCr of 0.93 mg/dL).  Liver Function Tests: Recent Labs  Lab 03/28/20 0324 03/29/20 0252 03/30/20 0309 03/31/20 0259 04/01/20 0333  AST 20 22 20 20 27   ALT <5 6 <5 <5 7  ALKPHOS 69 71 65 72 75  BILITOT 0.6 0.8 0.5 0.4 0.5  PROT 6.4* 6.5 6.2* 6.3* 6.3*  ALBUMIN 3.5 3.5 3.3* 3.3* 3.2*    CBG: No results for input(s): GLUCAP in the last 168 hours.   Recent Results (from the past 240 hour(s))  SARS Coronavirus 2 by RT PCR (hospital order, performed in [facility] hospital lab) Nasopharyngeal Nasopharyngeal Swab     Status: None   Collection Time: 03/27/20  2:00 PM   Specimen: Nasopharyngeal Swab  Result Value Ref Range Status   SARS Coronavirus 2 NEGATIVE NEGATIVE Final    Comment: (NOTE) SARS-CoV-2 target nucleic acids are NOT DETECTED. The SARS-CoV-2 RNA is generally detectable in upper and lower respiratory specimens during the acute phase of infection. The lowest concentration of SARS-CoV-2 viral copies this assay can detect is 250 copies /  mL. Zyler Hyson negative result does not preclude SARS-CoV-2 infection and should not be used as the sole basis for treatment or other patient management decisions.  Meldrick Buttery negative result may occur with improper specimen collection / handling, submission of specimen other than nasopharyngeal swab, presence of viral mutation(s) within the areas targeted by this assay, and inadequate number of viral copies (<250 copies / mL). Imelda Dandridge negative result must be combined with clinical observations, patient history, and epidemiological information. Fact Sheet for Patients:   Fact Sheet  for Healthcare Providers: BankingDealers.co.za This test is not yet approved or cleared  by the [place] and has been authorized for detection and/or diagnosis of SARS-CoV-2 by FDA under an Emergency Use Authorization (EUA).  This EUA will remain in effect (meaning this test can be used) for the duration of the COVID-19 declaration under Section 564(b)(1) of the Act, 21 U.S.C. section 360bbb-3(b)(1), unless the authorization is terminated or revoked sooner. Performed at [facility], [place] [address] [place], [place] [address]          Radiology Studies: No results found.      Scheduled Meds: . acetaminophen  1,000 mg Oral Q8H  . amLODipine  5 mg Oral BID  . ARIPiprazole  10 mg Oral Daily  . aspirin  81 mg Oral Daily  . atorvastatin  40 mg Oral Daily  . buPROPion  450 mg Oral Daily  . carbidopa-levodopa  1 tablet Oral TID  . cholecalciferol  2,000 Units Oral Daily  . gabapentin  300 mg Oral TID  . ketorolac  15 mg Intravenous Q6H  . lidocaine  1 patch Transdermal Q24H  . melatonin  5 mg Oral QHS  . metoprolol tartrate  25 mg Oral BID  . multivitamin with minerals  1 tablet Oral Daily  . sertraline  200 mg Oral Daily  . tiZANidine  2 mg Oral BID  . traZODone  50 mg Oral QHS   Continuous Infusions:   LOS: 5 days     Time spent: over 30 min    Fayrene Helper, MD [facility]   To contact the attending provider between 7A-7P or the covering provider during after hours 7P-7A, please log into the web site [email or web address] and access using universal [place] password for that web site. If you do not have the password, please call the hospital operator.  04/01/2020, 1:25 PM

## 2020-04-02 LAB — COMPREHENSIVE METABOLIC PANEL
ALT: 8 U/L (ref 0–44)
AST: 24 U/L (ref 15–41)
Albumin: 3.2 g/dL — ABNORMAL LOW (ref 3.5–5.0)
Alkaline Phosphatase: 81 U/L (ref 38–126)
Anion gap: 10 (ref 5–15)
BUN: 34 mg/dL — ABNORMAL HIGH (ref 8–23)
CO2: 22 mmol/L (ref 22–32)
Calcium: 9 mg/dL (ref 8.9–10.3)
Chloride: 108 mmol/L (ref 98–111)
Creatinine, Ser: 0.71 mg/dL (ref 0.44–1.00)
GFR calc Af Amer: >60 mL/min (ref >60)
GFR calc non Af Amer: >60 mL/min (ref >60)
Glucose, Bld: 105 mg/dL — ABNORMAL HIGH (ref 70–99)
Potassium: 4.7 mmol/L (ref 3.5–5.1)
Sodium: 140 mmol/L (ref 135–145)
Total Bilirubin: 0.6 mg/dL (ref 0.3–1.2)
Total Protein: 6.2 g/dL — ABNORMAL LOW (ref 6.5–8.1)

## 2020-04-02 LAB — CBC WITH DIFFERENTIAL/PLATELET
Abs Immature Granulocytes: 0.02 10*3/uL (ref 0.00–0.07)
Basophils Absolute: 0 10*3/uL (ref 0.0–0.1)
Basophils Relative: 1 %
Eosinophils Absolute: 0.3 10*3/uL (ref 0.0–0.5)
Eosinophils Relative: 4 %
HCT: 34.8 % — ABNORMAL LOW (ref 36.0–46.0)
Hemoglobin: 10.9 g/dL — ABNORMAL LOW (ref 12.0–15.0)
Immature Granulocytes: 0 %
Lymphocytes Relative: 24 %
Lymphs Abs: 2 10*3/uL (ref 0.7–4.0)
MCH: 28.4 pg (ref 26.0–34.0)
MCHC: 31.3 g/dL (ref 30.0–36.0)
MCV: 90.6 fL (ref 80.0–100.0)
Monocytes Absolute: 0.7 10*3/uL (ref 0.1–1.0)
Monocytes Relative: 8 %
Neutro Abs: 5.1 10*3/uL (ref 1.7–7.7)
Neutrophils Relative %: 63 %
Platelets: 342 10*3/uL (ref 150–400)
RBC: 3.84 MIL/uL — ABNORMAL LOW (ref 3.87–5.11)
RDW: 13.6 % (ref 11.5–15.5)
WBC: 8.2 10*3/uL (ref 4.0–10.5)
nRBC: 0 % (ref 0.0–0.2)

## 2020-04-02 LAB — MAGNESIUM: Magnesium: 2 mg/dL (ref 1.7–2.4)

## 2020-04-02 LAB — PHOSPHORUS: Phosphorus: 4.4 mg/dL (ref 2.5–4.6)

## 2020-04-02 MED ORDER — ACETAMINOPHEN 500 MG PO TABS: 1000.0000 mg | ORAL_TABLET | Freq: Three times a day (TID) | ORAL | 0 refills | Status: AC | PRN

## 2020-04-02 MED ORDER — LIDOCAINE 5 % EX PTCH: 1.0000 | MEDICATED_PATCH | CUTANEOUS | 0 refills | Status: DC

## 2020-04-02 MED ORDER — IBUPROFEN 400 MG PO TABS: 400.0000 mg | ORAL_TABLET | Freq: Four times a day (QID) | ORAL | 0 refills | Status: AC | PRN

## 2020-04-02 MED ORDER — PANTOPRAZOLE SODIUM 40 MG PO TBEC
40.0000 mg | DELAYED_RELEASE_TABLET | Freq: Every day | ORAL | 0 refills | Status: DC
Start: 2020-04-02 — End: 2020-04-05

## 2020-04-02 NOTE — Plan of Care (Signed)
  Problem: Education: Goal: Knowledge of General Education information will improve Description: Including pain rating scale, medication(s)/side effects and non-pharmacologic comfort measures Outcome: Adequate for Discharge   Problem: Health Behavior/Discharge Planning: Goal: Ability to manage health-related needs will improve Outcome: Adequate for Discharge   Problem: Clinical Measurements: Goal: Ability to maintain clinical measurements within normal limits will improve Outcome: Adequate for Discharge Goal: Will remain free from infection Outcome: Adequate for Discharge Goal: Diagnostic test results will improve Outcome: Adequate for Discharge Goal: Respiratory complications will improve Outcome: Adequate for Discharge Goal: Cardiovascular complication will be avoided Outcome: Adequate for Discharge   Problem: Activity: Goal: Risk for activity intolerance will decrease Outcome: Adequate for Discharge   Problem: Nutrition: Goal: Adequate nutrition will be maintained Outcome: Adequate for Discharge   Problem: Coping: Goal: Level of anxiety will decrease Outcome: Adequate for Discharge   Problem: Elimination: Goal: Will not experience complications related to bowel motility Outcome: Adequate for Discharge Goal: Will not experience complications related to urinary retention Outcome: Adequate for Discharge   Problem: Pain Managment: Goal: General experience of comfort will improve Outcome: Adequate for Discharge   Problem: Safety: Goal: Ability to remain free from injury will improve Outcome: Adequate for Discharge   Problem: Skin Integrity: Goal: Risk for impaired skin integrity will decrease Outcome: Adequate for Discharge   Problem: Acute Rehab PT Goals(only PT should resolve) Goal: Pt Will Go Supine/Side To Sit Outcome: Adequate for Discharge Goal: Pt Will Go Sit To Supine/Side Outcome: Adequate for Discharge Goal: Patient Will Transfer Sit To/From  Stand Outcome: Adequate for Discharge Goal: Pt Will Transfer Bed To Chair/Chair To Bed Outcome: Adequate for Discharge   Problem: Acute Rehab OT Goals (only OT should resolve) Goal: Pt. Will Perform Grooming Outcome: Adequate for Discharge Goal: Pt. Will Perform Upper Body Bathing Outcome: Adequate for Discharge Goal: Pt. Will Perform Lower Body Bathing Outcome: Adequate for Discharge Goal: Pt. Will Perform Upper Body Dressing Outcome: Adequate for Discharge Goal: Pt. Will Perform Lower Body Dressing Outcome: Adequate for Discharge Goal: Pt. Will Transfer To Toilet Outcome: Adequate for Discharge Goal: Pt. Will Perform Toileting-Clothing Manipulation Outcome: Adequate for Discharge

## 2020-04-02 NOTE — Progress Notes (Signed)
Physical Therapy Treatment Patient Details Name: Dana [name] MRN: [id number] DOB: [birth year] Today's Date: 04/02/2020    History of Present Illness Pt s/p multiple falls with non-operative non-displaced fx of C-spine and L greater trochanter..  Pt with hx of CVA with L side residual weakness and L THR(15)    PT Comments    Pt AxO x 4 and very sweet.  Recalls all her falls.  Lives home alone.  Assisted OOB.  General bed mobility comments: required much assist and increased time to complete.  Great difficulty scooting and moving L LE off bed. General transfer comment: 75% VC's on TTWB demonstarted and instructed.  "toe on floor for balance NO weight"   Assisted from elevated bed to [facility] 1/4 turn towards pt R + 2 assist for safety.  Then assisted with sit to stand + 2 side by side asssit and assisted with peri care as pt was unable to safely perform while standing at TTWB.General Gait Details: + 2 side by side assist with 75% VC's on TTWB "toe down only" and "quick hop" pt did advance 2 feet before c/o fatigue. Pt will need ST Rehab at SNF prior to safely returning home.   Follow Up Recommendations  SNF     Equipment Recommendations  None recommended by PT    Recommendations for Other Services       Precautions / Restrictions Precautions Precautions: Fall Required Braces or Orthoses: Cervical Brace Cervical Brace: Hard collar;At all times Restrictions Weight Bearing Restrictions: Yes LLE Weight Bearing: Touchdown weight bearing    Mobility  Bed Mobility Overal bed mobility: Needs Assistance Bed Mobility: Supine to Sit     Supine to sit: Max assist;Mod assist     General bed mobility comments: required much assist and increased time to complete.  Great difficulty scooting and moving L LE off bed.  Transfers Overall transfer level: Needs assistance Equipment used: Rolling walker (2 wheeled) Transfers: Sit to/from [employer] Sit to Stand: Max assist;+2  safety/equipment;+2 physical assistance;From elevated surface         General transfer comment: 75% VC's on TTWB demonstarted and instructed.  "toe on floor for balance NO weight"   Assisted from elevated bed to [facility] 1/4 turn towards pt R + 2 assist for safety.  Then assisted with sit to stand + 2 side by side asssit and assisted with peri care as pt was unable to safely perform while standing at TTWB.  Ambulation/Gait Ambulation/Gait assistance: Mod assist;Max assist;+2 physical assistance;+2 safety/equipment Gait Distance (Feet): 2 Feet Assistive device: Rolling walker (2 wheeled) Gait Pattern/deviations: Step-to pattern;Decreased stance time - left Gait velocity: decreased   General Gait Details: + 2 side by side assist with 75% VC's on TTWB "toe down only" and "quick hop" pt did advance 2 feet before c/o fatigue.   Stairs             Wheelchair Mobility    Modified Rankin (Stroke Patients Only)       Balance                                            Cognition Arousal/Alertness: Awake/alert Behavior During Therapy: WFL for tasks assessed/performed Overall Cognitive Status: Within Functional Limits for tasks assessed  General Comments: very pleasant and independent minded      Exercises      General Comments        Pertinent Vitals/Pain Pain Assessment: 0-10 Pain Score: 5  Pain Location: neck is "sore" and hip 5/10 pain with acttivty and no pain at rest Pain Descriptors / Indicators: Grimacing;Guarding;Sharp Pain Intervention(s): Monitored during session;Repositioned    Home Living                      Prior Function            PT Goals (current goals can now be found in the care plan section) Progress towards PT goals: Progressing toward goals    Frequency    Min 3X/week      PT Plan Current plan remains appropriate    Co-evaluation              AM-PAC PT  "6 Clicks" Mobility   Outcome Measure  Help needed turning from your back to your side while in a flat bed without using bedrails?: A Lot Help needed moving from lying on your back to sitting on the side of a flat bed without using bedrails?: A Lot Help needed moving to and from a bed to a chair (including a wheelchair)?: Total Help needed standing up from a chair using your arms (e.g., wheelchair or bedside chair)?: Total Help needed to walk in hospital room?: Total Help needed climbing 3-5 steps with a railing? : Total 6 Click Score: 8    End of Session Equipment Utilized During Treatment: Gait belt Activity Tolerance: Patient limited by fatigue;Patient limited by pain Patient left: in chair;with call bell/phone within reach;with chair alarm set Nurse Communication: Mobility status PT Visit Diagnosis: History of falling (Z91.81);Difficulty in walking, not elsewhere classified (R26.2);Muscle weakness (generalized) (M62.81);Other symptoms and signs involving the nervous system (R29.898)     Time: 4888-9169 PT Time Calculation (min) (ACUTE ONLY): 25 min  Charges:  $Gait Training: 8-22 mins $Therapeutic Activity: 8-22 mins                     Rica Koyanagi  PTA Acute  Rehabilitation Services Pager      [phone] Office      [phone]

## 2020-04-02 NOTE — Care Management Important Message (Signed)
Important Message  Patient Details IM Letter given to [name] SW Case Manager to present to the Patient Name: Dana [name] MRN: [id number] Date of Birth: 1946-01-09   Medicare Important Message Given:  Yes     Caren Macadam 04/02/2020, 12:01 PM

## 2020-04-02 NOTE — Discharge Summary (Signed)
Physician Discharge Summary  Dana [name] ZOX:[id number] DOB: 06/23/1946 DOA: 03/27/2020  PCP: Juluis RainierBarnes, [name], MD  Admit date: 03/27/2020 Discharge date: 04/02/2020  Time spent: 40 minutes  Recommendations for Outpatient Follow-up:  1. Follow outpatient CBC/CMP 2. Follow pain - continue prn tylenol.  Continue ibuprofen prn.  Continue lidocaine patch.  She was reluctant to use opiates here due to nausea.     3. Touchdown weight bearing to left lower extremity -> follow up with Dr. Eulah PontMurphy in 2-3 weeks (this will need to be scheduled), continue therapy at SNF with PT/OT 4. Needs follow up with neurosurgery as an outpatient for the nondisplaced fracture of the anterior arch of C1 with slight posterior subluxation of the L lateral mass of C1 relative to C2 - continue aspen collar - will need follow up appointment within 6 weeks (this will need to be scheduled)   Discharge Diagnoses:  Active Problems:   Fall   Peri-prosthetic fracture around prosthetic hip  Discharge Condition: stable  Diet recommendation: heart healthy  Filed Weights   03/27/20 1800  Weight: 51.3 kg    History of present illness:  Dana [name] 74 y.o.femalewith medical history significant ofHLD, anemia, depression, HTN, CVA with residual L sided weakness who presents after Dana [name] fall.   Patient notes she fell on Saturday, facedown. She did not seek care after this, but on Tuesday, fell 3 additional times. The 3rd time she fell on the bathroom floor and hit her left side. She was seen in the ED and had Dana [name] nondisplaced fracture of her C spine. When she got home she was not able to walk. Dana [name] neighbor had to carry her inside. She came back to the ED today due to L lower extremity pain and inability to walk and was found to have Dana [name] nondisplaced fracture of the L greater trochanter extending obliquely and medially into the proximal femoral diaphysis.  She was seen by orthopedics, Dr. Eulah PontMurphy, who recommended non  operative management of L prosthetic hip fracture.  Touch down weight bearing.  Plan for follow up in 2-3 weeks.  She's stable on 5/25 and plan for discharge to SNF.   Hospital Course:  Acute Nondisplaced Fracture of L Greater Trochanter extending obliquely and medially into the proximal femoral diaphysis:  Dr. Eulah PontMurphy reviewed films, plan for nonoperative management Orthopedics recommending TDWB LLE, follow up with Dr. Eulah PontMurphy in 2-3 weeks Touchdown weight bearing to left lower extremity PT/OT Pain control, bowel regimen - she's reluctant to use opiates, [facility] - discharged with APAP prn, ibuprofen prn, and lidocaine patch  Plan for SNF when bed available, 5/25  Nondisplaced Fx of the Anterior Arch of C1 with slight posterior subluxation of the L lateral Mass of C1 relative to C2:  Per EDP note from 5/18, neurosurgery recommended aspen collar and outpatient follow up -> needs follow up appointment within 6 weeks  Recurrent Falls: Pt notes this is longstanding issue, follow therapy evaluation.  Depression: continue wellbutrin, abilify, zoloft, trazodone  HTN: continue norvasc, metoprolol  HLD: continue lipitor  Parkinson's disease: continue sinemet (pt denied hx of parkinson's to me, but in neurology note from 02/28/2019, appears neurology suspected parkinson's at that time as likely cause of tremor, gait/balance difficulties, and frequent falls) - continue sinemet  Osteoporosis: fosamax on hold, follow outpatient  CVA with residual left sided weakness: aspirin/statin  Continue tizanidine and gabapentin  Procedures: none  Consultations:  orthopedics  Discharge Exam: Vitals:   04/01/20 2045 04/02/20 0541  BP: Marland Kitchen(!)  119/56 (!) 142/64  Pulse: 69 65  Resp: 17 17  Temp: 98.2 F (36.8 C) (!) 97.5 F (36.4 C)  SpO2: 100% 96%   Feels ok, no new complaints Asked if she'd like me to call anyone and she declined this  General: No acute  distress. Cardiovascular: Heart sounds show Dana [name] regular rate, and rhythm. Lungs: Clear to auscultation bilaterally  Abdomen: Soft, nontender, nondistended  Neurological: Alert and oriented 3. Moves all extremities 4. Cranial nerves II through XII grossly intact. Skin: Warm and dry. No rashes or lesions. Extremities: TTP to L hip   Discharge Instructions   Discharge Instructions    Call MD for:  difficulty breathing, headache or visual disturbances   Complete by: As directed    Call MD for:  extreme fatigue   Complete by: As directed    Call MD for:  hives   Complete by: As directed    Call MD for:  persistant dizziness or light-headedness   Complete by: As directed    Call MD for:  persistant nausea and vomiting   Complete by: As directed    Call MD for:  redness, tenderness, or signs of infection (pain, swelling, redness, odor or green/yellow discharge around incision site)   Complete by: As directed    Call MD for:  severe uncontrolled pain   Complete by: As directed    Call MD for:  temperature >100.4   Complete by: As directed    Diet - low sodium heart healthy   Complete by: As directed    Discharge instructions   Complete by: As directed    You were seen for Dana [name] left hip fracture.  You were seen by orthopedics who recommended non operative management.  Please follow up with Dr. Eulah Pont in 2-3 weeks.  You will be touch down weight bearing to your left lower extremity.  Please continue to work with therapy.  You should continue your aspen collar.  Follow up with neurosurgery outpatient, you'll need to call for Dana [name] follow up appointment in 6 weeks.   Return for new, recurrent, or worsening symptoms.  Please ask your PCP to request records from this hospitalization so they know what was done and what the next steps will be.   Increase activity slowly   Complete by: As directed    Touch down weight bearing   Complete by: As directed    Laterality: left   Extremity: Lower      Allergies as of 04/02/2020      Reactions   Percodan [oxycodone-aspirin] Nausea Only   Oxycodone Nausea Only      Medication List    TAKE these medications   acetaminophen 500 MG tablet Commonly known as: TYLENOL Take 2 tablets (1,000 mg total) by mouth every 8 (eight) hours as needed.   alendronate 70 MG tablet Commonly known as: FOSAMAX Take 1 tablet (70 mg total) by mouth once Javyon Fontan week. Take with 8 oz of water 30 minutes prior to food/meds. Do not lie down for 30 minutes.   amLODipine 5 MG tablet Commonly known as: NORVASC Take 1 tablet (5 mg total) by mouth 2 (two) times daily.   ARIPiprazole 10 MG tablet Commonly known as: Abilify Take 1 tablet (10 mg total) by mouth daily.   aspirin 81 MG chewable tablet Chew 81 mg by mouth daily.   atorvastatin 40 MG tablet Commonly known as: LIPITOR Take 1 tablet (40 mg total) by mouth daily.   buPROPion 150 MG 24  hr tablet Commonly known as: WELLBUTRIN XL Take 3 tablets (450 mg total) by mouth daily.   carbidopa-levodopa 25-100 MG tablet Commonly known as: SINEMET IR Take 1 tablet by mouth 3 (three) times daily.   gabapentin 300 MG capsule Commonly known as: NEURONTIN Take 1 capsule (300 mg total) by mouth 3 (three) times daily.   ibuprofen 400 MG tablet Commonly known as: ADVIL Take 1 tablet (400 mg total) by mouth every 6 (six) hours as needed for up to 7 days for mild pain.   lidocaine 5 % Commonly known as: LIDODERM Place 1 patch onto the skin daily. Remove & Discard patch within 12 hours or as directed by MD   melatonin 5 MG Tabs Take 5 mg by mouth at bedtime.   metoprolol tartrate 25 MG tablet Commonly known as: LOPRESSOR Take 1 tablet (25 mg total) by mouth 2 (two) times daily.   multivitamin tablet Take 1 tablet by mouth daily.   pantoprazole 40 MG tablet Commonly known as: Protonix Take 1 tablet (40 mg total) by mouth daily.   sertraline 100 MG tablet Commonly known as: ZOLOFT Take 2 tablets (200  mg total) by mouth daily.   tiZANidine 2 MG tablet Commonly known as: ZANAFLEX Take 1 tablet (2 mg total) by mouth 2 (two) times daily. QAM and QHS   traZODone 50 MG tablet Commonly known as: DESYREL Take 1-2 tablets (50-100 mg total) by mouth at bedtime as needed for sleep. What changed:   how much to take  when to take this   Vitamin D 50 MCG (2000 UT) Caps Take 2,000 Units by mouth daily.            Discharge Care Instructions  (From admission, onward)         Start     Ordered   04/02/20 0000  Touch down weight bearing    Question Answer Comment  Laterality left   Extremity Lower      04/02/20 1330         Allergies  Allergen Reactions  . Percodan [Oxycodone-Aspirin] Nausea Only  . Oxycodone Nausea Only    Contact information for follow-up providers    Juluis Rainier, MD Follow up.   Specialty: Family Medicine Contact information: [address] [place] [address] [phone]        Sheral Apley, MD Follow up.   Specialty: Orthopedic Surgery Why: Call for follow up in 2-3 weeks  Contact information: [address] [place] [address] [phone]        [facility] Follow up.   Specialty: Neurosurgery Why: Call for follow up within 6 weeks  Contact information: [address] [place] [address] [phone]            Contact information for after-discharge care    Destination    [facility] Preferred SNF .   Service: Skilled Nursing Contact information: [address] [place] [address] [phone]                   The results of significant diagnostics from this hospitalization (including imaging, microbiology, ancillary and laboratory) are listed below for reference.    Significant Diagnostic Studies: CT Cervical Spine Wo Contrast  Result Date: 03/26/2020 CLINICAL DATA:  Syncopal episode.  Left shoulder left leg pain. EXAM: CT CERVICAL SPINE WITHOUT CONTRAST TECHNIQUE: Multidetector CT imaging of the cervical spine was performed without  intravenous contrast. Multiplanar CT image reconstructions were also generated. COMPARISON:  None. FINDINGS: Alignment: Normal. Skull base and vertebrae: Nondisplaced fracture of the anterior arch of C1 with slight posterior subluxation of the left lateral mass of C1 relative to C2. No primary bone lesion or focal pathologic process. Soft tissues and spinal canal: No prevertebral fluid or swelling. No visible canal hematoma. Disc levels: Degenerative disc disease with disc height loss at C6-7, C7-T1 and T1-2. 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 Mild bilateral facet arthropathy at C2-3. At C3-4 there is bilateral facet hypertrophic changes and right foraminal narrowing. At C4-5 there is Kienna Moncada mild broad-based disc bulge, bilateral facet arthropathy. At C5-6 there is moderate bilateral facet arthropathy, bilateral uncovertebral degenerative changes and mild left foraminal narrowing. At C6-7 there is Krithi Bray broad-based disc osteophyte complex, bilateral facet arthropathy and bilateral foraminal narrowing. At C7-T1 there is bilateral facet arthropathy. At T1-2 there is bilateral facet arthropathy. Upper chest: Lung apices are clear. Other: Bilateral carotid artery atherosclerosis. IMPRESSION: 1. Nondisplaced fracture of the anterior arch of C1 with slight posterior subluxation of the left lateral mass of C1 relative to C2. 2. Cervical spine spondylosis as described above. Electronically Signed   By: Elige Ko   On: 03/26/2020 17:02   CT Hip Left Wo Contrast  Result Date: 03/27/2020 CLINICAL DATA:  Left hip pain. Constant pain. EXAM: CT OF THE LEFT HIP WITHOUT CONTRAST TECHNIQUE: Multidetector CT imaging of the left hip was performed according to the standard protocol. Multiplanar CT image reconstructions were also generated. COMPARISON:   None. FINDINGS: Bones/Joint/Cartilage Left total hip arthroplasty. No periarticular fluid collection or osteolysis. Acute nondisplaced fracture of the left greater trochanter extending obliquely and medially into the proximal femoral diaphysis. Normal alignment. No joint effusion. Mild osteoarthritis of the left SI joint. Ligaments Ligaments are suboptimally evaluated by CT. Muscles and Tendons No intramuscular hematoma.  Muscles are normal. Soft tissue No fluid collection or hematoma. No soft tissue mass. IMPRESSION: Left total hip arthroplasty. Acute nondisplaced fracture of the left greater trochanter extending obliquely and medially into the proximal femoral diaphysis. Electronically Signed   By: Elige Ko   On: 03/27/2020 14:02   DG CHEST PORT 1 VIEW  Result Date: 03/27/2020 CLINICAL DATA:  Preoperative hip fracture. EXAM: PORTABLE CHEST 1 VIEW COMPARISON:  September 25, 2019 FINDINGS: There is no evidence of acute infiltrate, pleural effusion or pneumothorax. The heart size and mediastinal contours are within normal limits. There is mild to moderate severity calcification of the thoracic aorta. The visualized skeletal structures are unremarkable. IMPRESSION: No active disease. Electronically Signed   By: Aram Candela M.D.   On: 03/27/2020 15:40   DG Shoulder Left  Result Date: 03/26/2020 CLINICAL DATA:  Fall in bathroom today. Left shoulder pain. Initial encounter. EXAM: LEFT SHOULDER - 2+ VIEW COMPARISON:  None. FINDINGS: There is no evidence of fracture or dislocation. There is no evidence of arthropathy or other focal bone abnormality. Generalized osteopenia noted. Soft tissues are unremarkable. IMPRESSION: No acute findings. Osteopenia. Electronically Signed   By: Danae Orleans M.D.   On: 03/26/2020 15:52   DG Hip Unilat W or Wo Pelvis 2-3 Views Left  Result Date: 03/27/2020 CLINICAL DATA:  Fall yesterday. Persistent left hip pain. Initial encounter. EXAM: DG HIP (WITH OR WITHOUT PELVIS)  2-3V LEFT COMPARISON:  05/29/2019 FINDINGS: Unipolar left hip prosthesis is seen in expected position. No evidence of fracture or dislocation. No other osseous abnormality identified. IMPRESSION: Left hip  prosthesis in appropriate position.  No acute findings. Electronically Signed   By: Marlaine Hind M.D.   On: 03/27/2020 12:13    Microbiology: Recent Results (from the past 240 hour(s))  SARS Coronavirus 2 by RT PCR (hospital order, performed in [facility] hospital lab) Nasopharyngeal Nasopharyngeal Swab     Status: None   Collection Time: 03/27/20  2:00 PM   Specimen: Nasopharyngeal Swab  Result Value Ref Range Status   SARS Coronavirus 2 NEGATIVE NEGATIVE Final    Comment: (NOTE) SARS-CoV-2 target nucleic acids are NOT DETECTED. The SARS-CoV-2 RNA is generally detectable in upper and lower respiratory specimens during the acute phase of infection. The lowest concentration of SARS-CoV-2 viral copies this assay can detect is 250 copies / mL. Zarie Kosiba negative result does not preclude SARS-CoV-2 infection and should not be used as the sole basis for treatment or other patient management decisions.  Angelyn Osterberg negative result may occur with improper specimen collection / handling, submission of specimen other than nasopharyngeal swab, presence of viral mutation(s) within the areas targeted by this assay, and inadequate number of viral copies (<250 copies / mL). Daundre Biel negative result must be combined with clinical observations, patient history, and epidemiological information. Fact Sheet for Patients:   StrictlyIdeas.no Fact Sheet for Healthcare Providers: BankingDealers.co.za This test is not yet approved or cleared  by the [place] FDA and has been authorized for detection and/or diagnosis of SARS-CoV-2 by FDA under an Emergency Use Authorization (EUA).  This EUA will remain in effect (meaning this test can be used) for the duration of the COVID-19  declaration under Section 564(b)(1) of the Act, 21 U.S.C. section 360bbb-3(b)(1), unless the authorization is terminated or revoked sooner. Performed at [facility], [place] [address], [place], [place] [address]   SARS Coronavirus 2 by RT PCR (hospital order, performed in [facility] hospital lab) Nasopharyngeal Nasopharyngeal Swab     Status: None   Collection Time: 04/01/20  3:04 PM   Specimen: Nasopharyngeal Swab  Result Value Ref Range Status   SARS Coronavirus 2 NEGATIVE NEGATIVE Final    Comment: (NOTE) SARS-CoV-2 target nucleic acids are NOT DETECTED. The SARS-CoV-2 RNA is generally detectable in upper and lower respiratory specimens during the acute phase of infection. The lowest concentration of SARS-CoV-2 viral copies this assay can detect is 250 copies / mL. Alejandra Barna negative result does not preclude SARS-CoV-2 infection and should not be used as the sole basis for treatment or other patient management decisions.  Solveig Fangman negative result may occur with improper specimen collection / handling, submission of specimen other than nasopharyngeal swab, presence of viral mutation(s) within the areas targeted by this assay, and inadequate number of viral copies (<250 copies / mL). Derra Shartzer negative result must be combined with clinical observations, patient history, and epidemiological information. Fact Sheet for Patients:   StrictlyIdeas.no Fact Sheet for Healthcare Providers: BankingDealers.co.za This test is not yet approved or cleared  by the [place] FDA and has been authorized for detection and/or diagnosis of SARS-CoV-2 by FDA under an Emergency Use Authorization (EUA).  This EUA will remain in effect (meaning this test can be used) for the duration of the COVID-19 declaration under Section 564(b)(1) of the Act, 21 U.S.C. section 360bbb-3(b)(1), unless the authorization is terminated or revoked sooner. Performed at [facility], [place] [address], [place], [place] [address]      Labs: Basic Metabolic Panel: Recent Labs  Lab 03/29/20 6387 03/30/20 0309 03/31/20 5643 04/01/20 3295 04/02/20  0310  NA 139 139 140 140 140  K 3.8 4.1 4.2 4.5 4.7  CL 105 105 105 104 108  CO2 29 28 24 29 22   GLUCOSE 118* 104* 103* 101* 105*  BUN 12 20 19  29* 34*  CREATININE 0.63 0.74 0.67 0.93 0.71  CALCIUM 8.7* 8.8* 9.0 8.7* 9.0  MG 1.9 2.0 1.9 2.1 2.0  PHOS 4.3 3.8 4.1 4.8* 4.4   Liver Function Tests: Recent Labs  Lab 03/29/20 0252 03/30/20 0309 03/31/20 0259 04/01/20 0333 04/02/20 0310  AST 22 20 20 27 24   ALT 6 <5 5 7 8   ALKPHOS 71 65 72 75 81  BILITOT 0.8 0.5 0.4 0.5 0.6  PROT 6.5 6.2* 6.3* 6.3* 6.2*  ALBUMIN 3.5 3.3* 3.3* 3.2* 3.2*   No results for input(s): LIPASE, AMYLASE in the last 168 hours. No results for input(s): AMMONIA in the last 168 hours. CBC: Recent Labs  Lab 03/29/20 0252 03/30/20 0309 03/31/20 0259 04/01/20 0333 04/02/20 0310  WBC 8.1 6.9 6.3 7.5 8.2  NEUTROABS 5.3 3.9 3.7 4.1 5.1  HGB 10.7* 11.0* 10.6* 11.0* 10.9*  HCT 34.1* 35.2* 33.4* 34.6* 34.8*  MCV 89.3 91.0 89.8 90.8 90.6  PLT 319 340 353 363 342   Cardiac Enzymes: No results for input(s): CKTOTAL, CKMB, CKMBINDEX, TROPONINI in the last 168 hours. BNP: BNP (last 3 results) No results for input(s): BNP in the last 8760 hours.  ProBNP (last 3 results) No results for input(s): PROBNP in the last 8760 hours.  CBG: No results for input(s): GLUCAP in the last 168 hours.     Signed:  04/02/20 MD.  [facility] 04/02/2020, 1:31 PM

## 2020-04-02 NOTE — TOC Transition Note (Addendum)
Transition of Care [facility]) - CM/SW Discharge Note   Patient Details  Name: Dana [name] MRN: [id number] Date of Birth: 03-13-1946  [facility] CM/SW Contact:  Clearance Coots, LCSW Phone Number: 04/02/2020, 1:04 PM   Clinical Narrative:    Authorization reference number for [facility] #[id number] Approved starting today 1/25-1/27 Facility provided [facility]. Details.  Patient will transport by [facility].    Nurse call report to: [phone] Room 109  Final next level of care: Skilled Nursing Facility Barriers to Discharge: No Barriers Identified   Patient Goals and CMS Choice Patient states their goals for this hospitalization and ongoing recovery are:: "I want to get up and walk again, I was almost done with my home health PT before I fell again." CMS Medicare.gov Compare Post Acute Care list provided to:: Patient Choice offered to / list presented to : Patient  Discharge Placement   Existing PASRR number confirmed : 03/29/20          Patient chooses bed at: [facility] Patient to be transferred to facility by: PT[facility] Name of family member notified: Patient will notify her family and friend Patient and family notified of of transfer: 04/02/20  Discharge Plan and Services In-house Referral: Clinical Social Work Discharge Planning Services: NA Post Acute Care Choice: Skilled Nursing Facility          DME Arranged: N/A DME Agency: NA       HH Arranged: NA HH Agency: NA        Social Determinants of Health (SDOH) Interventions     Readmission Risk Interventions No flowsheet data found.

## 2020-04-03 ENCOUNTER — Encounter: Payer: Self-pay | Admitting: Internal Medicine

## 2020-04-03 ENCOUNTER — Non-Acute Institutional Stay (SKILLED_NURSING_FACILITY): Payer: Medicare Other | Admitting: Internal Medicine

## 2020-04-03 DIAGNOSIS — Z96649 Presence of unspecified artificial hip joint: Secondary | ICD-10-CM

## 2020-04-03 DIAGNOSIS — M978XXD Periprosthetic fracture around other internal prosthetic joint, subsequent encounter: Secondary | ICD-10-CM

## 2020-04-03 DIAGNOSIS — Z8673 Personal history of transient ischemic attack (TIA), and cerebral infarction without residual deficits: Secondary | ICD-10-CM

## 2020-04-03 DIAGNOSIS — I10 Essential (primary) hypertension: Secondary | ICD-10-CM | POA: Diagnosis not present

## 2020-04-03 DIAGNOSIS — S12001D Unspecified nondisplaced fracture of first cervical vertebra, subsequent encounter for fracture with routine healing: Secondary | ICD-10-CM

## 2020-04-03 DIAGNOSIS — F320 Major depressive disorder, single episode, mild: Secondary | ICD-10-CM

## 2020-04-03 NOTE — Progress Notes (Signed)
Location:    [facility] Room Number: 109/D Place of Service:  SNF (31) Provider:  Jeannie Done, DO  Patient Care Team: Kermit Balo, DO as PCP - General (Geriatric Medicine)  Extended Emergency Contact Information Primary Emergency Contact: Annia Friendly Address: [address]          [place], [place] [address] D[place] of [place] Home Phone: [phone] Relation: Friend Secondary Emergency Contact: Moishe Spice, [place] [address] D[place] of [place] Phone: [phone] Relation: Son  Code Status:  DNR Goals of care: Advanced Directive information Advanced Directives 04/03/2020  Does Patient Have a Medical Advance Directive? Yes  Type of Advance Directive Out of facility DNR (pink MOST or yellow form)  Does patient want to make changes to medical advance directive? No - Patient declined  Copy of Healthcare Power of Attorney in Chart? -  Pre-existing out of facility DNR order (yellow form or pink MOST form) Yellow form placed in chart (order not valid for inpatient use)     Chief Complaint  Patient presents with  . Hospitalization Follow-up    Hospitalization Follow Up   Status post fracture of left greater trochanter with nonoperative management  HPI:  Pt is a 74 y.o. female seen today for a hospital f/u after sustaining a fracture of the left greater trochanter.  She has a previous medical history of hyperlipidemia as well as depression anemia hypertension CVA with residual left-sided weakness.  Apparently also has some history of parkinsonian some.  Apparently she fell at home but did not seek care but about 3 days later she fell 3 more times.  Third fall she apparently fell on the left side of her body-she went to the hospital and found to have a nondisplaced fracture of her C-spine-she was not able to walk however when she got home-she came back to the ED and found to have a  nondisplaced fracture of the left greater trochanter that extend aerobically and medially into the proximal femoral diaphysis.  Dr. Eulah Pont of orthopedics saw her and recommended nonoperative management of the left prosthetic hip fracture.  Touchdown weightbearing.  She is receiving Tylenol for pain-she is trying to avoid narcotics.  In regards to the nondisplaced fracture of the anterior arch of C1 with slight posterior subluxation of the left lateral mass of C1-neurosurgery recommend an aspirin collar and outpatient follow-up she is currently wearing the collar.  She also will need PT and OT for strengthening especially with her history of recurrent falls  She apparently does have a history of Parkinson's disease per neurology-she is on Sinemet 3 times a day.  She also continues on Fosamax q. weekly with a history of osteoporosis.  She does have a previous history of CVA with baseline left-sided weakness and continues on aspirin as well as a statin.  Currently her pain appears to be controlled she does have the orders for as needed Tylenol she also has orders for Zanaflex twice a day and Neurontin 3 times a day.  Currently she is sitting in her chair comfortably she continues to be very pleasant alert-vital signs appear to be stable    At this point she is not really complaining of significant pain.      Past Medical History:  Diagnosis Date  . Anemia    none since hysterectomy  . Arthritis    LlTHA 11'15(had a fall)-"remains tender"  .  Depression   . Heart murmur    history of  . Hyperlipidemia   . Hypertension   . Stroke [facility])    4-5 yrs ago(left sided weakness) -no residual  . Transfusion history    age 74- "hysterectomy"   Past Surgical History:  Procedure Laterality Date  . ABDOMINAL HYSTERECTOMY    . COLONOSCOPY WITH PROPOFOL N/A 12/25/2014   Procedure: COLONOSCOPY WITH PROPOFOL;  Surgeon: Charolett BumpersMartin K Johnson, MD;  Location: [facility] END[facility]COPY;  Service: Endoscopy;   Laterality: N/A;  . FRACTURE SURGERY  01/22/12   fx of eye socket from fall-"retained plate"  . HAND SURGERY Left    nerve release -many yrs ago  . HIP ARTHROPLASTY Left 09/16/2014   Procedure: ARTHROPLASTY BIPOLAR HIP;  Surgeon: Sheral Apleyimothy D Murphy, MD;  [facility];  Service: Orthopedics;  Laterality: Left;    Allergies  Allergen Reactions  . Percodan [Oxycodone-Aspirin] Nausea Only  . Oxycodone Nausea Only    Allergies as of 04/03/2020      Reactions   Percodan [oxycodone-aspirin] Nausea Only   Oxycodone Nausea Only      Medication List       Accurate as of Apr 03, 2020  4:07 PM. If you have any questions, ask your nurse or doctor.        acetaminophen 500 MG tablet Commonly known as: TYLENOL Take 2 tablets (1,000 mg total) by mouth every 8 (eight) hours as needed.   alendronate 70 MG tablet Commonly known as: FOSAMAX Take 1 tablet (70 mg total) by mouth once a week. Take with 8 oz of water 30 minutes prior to food/meds. Do not lie down for 30 minutes.   amLODipine 5 MG tablet Commonly known as: NORVASC Take 1 tablet (5 mg total) by mouth 2 (two) times daily.   ARIPiprazole 10 MG tablet Commonly known as: Abilify Take 1 tablet (10 mg total) by mouth daily.   aspirin 81 MG chewable tablet Chew 81 mg by mouth daily.   atorvastatin 40 MG tablet Commonly known as: LIPITOR Take 1 tablet (40 mg total) by mouth daily.   buPROPion 150 MG 24 hr tablet Commonly known as: WELLBUTRIN XL Take 3 tablets (450 mg total) by mouth daily.   carbidopa-levodopa 25-100 MG tablet Commonly known as: SINEMET IR Take 1 tablet by mouth 3 (three) times daily.   gabapentin 300 MG capsule Commonly known as: NEURONTIN Take 1 capsule (300 mg total) by mouth 3 (three) times daily.   ibuprofen 400 MG tablet Commonly known as: ADVIL Take 1 tablet (400 mg total) by mouth every 6 (six) hours as needed for up to 7 days for mild pain.   lactose free nutrition Liqd Take 237 mLs by mouth  2 (two) times daily between meals.   lidocaine 5 % Commonly known as: LIDODERM Place 1 patch onto the skin daily. Remove & Discard patch within 12 hours or as directed by MD   melatonin 5 MG Tabs Take 5 mg by mouth at bedtime.   metoprolol tartrate 25 MG tablet Commonly known as: LOPRESSOR Take 1 tablet (25 mg total) by mouth 2 (two) times daily.   multivitamin tablet Take 1 tablet by mouth daily.   pantoprazole 40 MG tablet Commonly known as: Protonix Take 1 tablet (40 mg total) by mouth daily.   sertraline 100 MG tablet Commonly known as: ZOLOFT Take 2 tablets (200 mg total) by mouth daily.   tiZANidine 2 MG tablet Commonly known as: ZANAFLEX Take 1 tablet (2 mg  total) by mouth 2 (two) times daily. QAM and QHS   traZODone 50 MG tablet Commonly known as: DESYREL Take 50 mg by mouth at bedtime as needed for sleep. What changed: Another medication with the same name was removed. Continue taking this medication, and follow the directions you see here. Changed by: Edmon Crape, PA-C   Vitamin D 50 MCG (2000 UT) Caps Take 2,000 Units by mouth daily.       Review of Systems   In general she is not complaining of any fever or chills.  Skin is not complaining of rashes or itching.  Head ears eyes nose mouth and throat does not complain of visual changes or sore throat at this time.  Respiratory not complain of being short of breath or having cough.  Cardiac does not complain of chest pain or increasing edema.  GI is not complaining of abdominal pain nausea vomiting diarrhea constipation.  GU does not complain of dysuria.  Musculoskeletal does have the history of the trochanter fracture as well as cervical fracture-at this point pain appears to be relatively well controlled.  Neurologic does not complain of dizziness headache or increasing numbness.  Psych is not complaining of being depressed or anxious at this time I do note she is on Zoloft but appears to be in  good spirits   Immunization History  Administered Date(s) Administered  . Influenza, High Dose Seasonal PF 08/13/2017, 07/12/2019  . PFIZER SARS-COV-2 Vaccination 01/05/2020, 01/31/2020  . Zoster Recombinat (Shingrix) 07/12/2019, 09/10/2019   Pertinent  Health Maintenance Due  Topic Date Due  . DEXA SCAN  Never done  . PNA vac Low Risk Adult (1 of 2 - PCV13) Never done  . INFLUENZA VACCINE  06/09/2020  . COLONOSCOPY  12/25/2024   Fall Risk  01/22/2020 08/02/2018 05/26/2018  Falls in the past year? 0 No No   Functional Status Survey:    Vitals:   04/03/20 1605  BP: 118/60  Pulse: 66  Resp: 18  Temp: (!) 97.4 F (36.3 C)  TempSrc: Oral  SpO2: 96%  Weight: 113 lb (51.3 kg)  Height:  (1.575 m)   Body mass index is 20.67 kg/m. Physical Exam In general this is a pleasant elderly female in no distress sitting comfortably in her chair.  Her skin is warm and dry.  Eyes visual acuity appears to be intact sclera and conjunctive are clear.  Oropharynx is clear mucous membranes moist.  Neck she does have an Aspen collar in place.  Chest is clear to auscultation there is no labored breathing.  Heart is regular rate and rhythm with a systolic murmur she has trace lower extremity edema.  Abdomen is soft nontender with positive bowel sounds.  Musculoskeletal appears able to move all extremities x4 with some weakness as noted on the left side-has limited mobility of her left lower extremity status post recent fracture-she is kyphotic.  Neurologic as noted above does have a history of left-sided weakness-cranial nerves appear to be intact her speech is clear.  Psych she is grossly alert and oriented very pleasant and appropriate.   Labs reviewed: Recent Labs    03/31/20 0259 04/01/20 0333 04/02/20 0310  NA 140 140 140  K 4.2 4.5 4.7  CL 105 104 108  CO2 GLUCOSE 103* 101* 105*  BUN 19 29* 34*  CREATININE 0.67 0.93 0.71  CALCIUM 9.0 8.7* 9.0  MG 1.9 2.1  2.0  PHOS 4.1 4.8* 4.4   Recent Labs  03/31/20 0259 04/01/20 0333 04/02/20 0310  AST 20 27 24   ALT 5 7 8   ALKPHOS 72 75 81  BILITOT 0.4 0.5 0.6  PROT 6.3* 6.3* 6.2*  ALBUMIN 3.3* 3.2* 3.2*   Recent Labs    03/31/20 0259 04/01/20 0333 04/02/20 0310  WBC 6.3 7.5 8.2  NEUTROABS 3.7 4.1 5.1  HGB 10.6* 11.0* 10.9*  HCT 33.4* 34.6* 34.8*  MCV 89.8 90.8 90.6  PLT 353 363 342   Lab Results  Component Value Date   TSH 1.160 05/26/2018   Lab Results  Component Value Date   HGBA1C (H) 03/11/2011    6.0 (NOTE)                                                                       According to the ADA Clinical Practice Recommendations for 2011, when HbA1c is used as a screening test:   >=6.5%   Diagnostic of Diabetes Mellitus           (if abnormal result  is confirmed)  5.7-6.4%   Increased risk of developing Diabetes Mellitus  References:Diagnosis and Classification of Diabetes Mellitus,Diabetes Care,2011,34(Suppl 1):S62-S69 and Standards of Medical Care in         Diabetes - 2011,Diabetes Care,2011,34  (Suppl 1):S11-S61.   Lab Results  Component Value Date   CHOL 236 (H) 03/12/2011   HDL 72 03/12/2011   LDLCALC (H) 03/12/2011    121        Total Cholesterol/HDL:CHD Risk Coronary Heart Disease Risk Table                     Men   Women  1/2 Average Risk   3.4   3.3  Average Risk       5.0   4.4  2 X Average Risk   9.6   7.1  3 X Average Risk  23.4   11.0        Use the calculated Patient Ratio above and the CHD Risk Table to determine the patient's CHD Risk.        ATP III CLASSIFICATION (LDL):  <100     mg/dL   Optimal  05/12/2011  mg/dL   Near or Above                    Optimal  130-159  mg/dL   Borderline  366-294  mg/dL   High  765-465     mg/dL   Very High   TRIG >035 (H) 03/12/2011   CHOLHDL 3.3 03/12/2011    Significant Diagnostic Results in last 30 days:  CT Hip Left Wo Contrast  Result Date: 03/27/2020 CLINICAL DATA:  Left hip pain. Constant pain.  EXAM: CT OF THE LEFT HIP WITHOUT CONTRAST TECHNIQUE: Multidetector CT imaging of the left hip was performed according to the standard protocol. Multiplanar CT image reconstructions were also generated. COMPARISON:  None. FINDINGS: Bones/Joint/Cartilage Left total hip arthroplasty. No periarticular fluid collection or osteolysis. Acute nondisplaced fracture of the left greater trochanter extending obliquely and medially into the proximal femoral diaphysis. Normal alignment. No joint effusion. Mild osteoarthritis of the left SI joint. Ligaments Ligaments are suboptimally evaluated by CT. Muscles and Tendons No intramuscular hematoma.  Muscles are normal. Soft tissue No fluid collection or hematoma. No soft tissue mass. IMPRESSION: Left total hip arthroplasty. Acute nondisplaced fracture of the left greater trochanter extending obliquely and medially into the proximal femoral diaphysis. Electronically Signed   By: Kathreen Devoid   On: 03/27/2020 14:02   DG CHEST PORT 1 VIEW  Result Date: 03/27/2020 CLINICAL DATA:  Preoperative hip fracture. EXAM: PORTABLE CHEST 1 VIEW COMPARISON:  September 25, 2019 FINDINGS: There is no evidence of acute infiltrate, pleural effusion or pneumothorax. The heart size and mediastinal contours are within normal limits. There is mild to moderate severity calcification of the thoracic aorta. The visualized skeletal structures are unremarkable. IMPRESSION: No active disease. Electronically Signed   By: Virgina Norfolk M.D.   On: 03/27/2020 15:40   DG Hip Unilat W or Wo Pelvis 2-3 Views Left  Result Date: 03/27/2020 CLINICAL DATA:  Fall yesterday. Persistent left hip pain. Initial encounter. EXAM: DG HIP (WITH OR WITHOUT PELVIS) 2-3V LEFT COMPARISON:  05/29/2019 FINDINGS: Unipolar left hip prosthesis is seen in expected position. No evidence of fracture or dislocation. No other osseous abnormality identified. IMPRESSION: Left hip prosthesis in appropriate position.  No acute findings.  Electronically Signed   By: Marlaine Hind M.D.   On: 03/27/2020 12:13    Assessment/Plan  #1 history of acute nondisplaced fracture left greater trochanter with extension obliquely medially into the proximal femur diaphysis-again she was seen by orthopedics with nonoperative management desired.  She will have follow-up with Dr. Percell Miller.  Continues touchdown weightbearing.  She does not want to be on opioids and is being treated with as needed Tylenol 1000 mg 3 times daily as needed as well as ibuprofen 400 mg every 6 hours as needed for 7 days she also has a Lidoderm patch.  Of note there are also orders for Zanaflex 2 mg twice daily and Neurontin 300 mg 3 times daily.  2.  History of nondisplaced fracture of the anterior arch of C1-she has been seen by neurosurgery and does have an [place] also will need outpatient follow-up by neurology.  At this point appears to be stable.  3.  History of depression this appears to be fairly significant she is on Wellbutrin 450 mg a day in addition to Zoloft 200 mg a day.  She also continues on Abilify 10 mg a day.  She also has orders for trazodone nightly at night for insomnia 50 mg.  I note she also has melatonin at night.  At this point she appears to be stable appears to have a positive attitude does not complain of being depressed.  4.  History of hypertension at this point appears stable she is on Norvasc 5 mg twice a day as well as metoprolol 25 mg twice a day-recent blood pressures 118/60-119/59.  5.  History of hyperlipidemia not stated as uncontrolled she is on Lipitor 40 mg a day.  6.  History of CVA with residual left-sided weakness at this point she will need continued PT and OT she continues on aspirin 81 mg a day in addition to Lipitor 40 mg a day.  7.  History of Parkinson's disease per neurology-she continues on Sinemet 25-100 mg 3 times daily at this point will monitor.  8.  History of osteoporosis she has orders for  Fosamax q. weekly.  At this point will update blood work-including a CBC and metabolic panel.  She appears to be stable at this point pain appears to be relatively well controlled again  she would like to avoid opiates.  She will need continued PT and OT.  [id number] note greater than 35 minutes spent assessing patient-reviewing her chart and labs-and coordinating formulating a plan of care for numerous diagnoses-of note greater than 50% of time spent coordinating a plan of care with input as noted above

## 2020-04-04 LAB — BASIC METABOLIC PANEL
BUN: 16 (ref 4–21)
CO2: 23 — AB (ref 13–22)
Chloride: 105 (ref 99–108)
Creatinine: 0.7 (ref 0.5–1.1)
Glucose: 88
Potassium: 4.6 (ref 3.4–5.3)
Sodium: 141 (ref 137–147)

## 2020-04-04 LAB — CBC AND DIFFERENTIAL
HCT: 30 — AB (ref 36–46)
Hemoglobin: 10.4 — AB (ref 12.0–16.0)
Neutrophils Absolute: 4
Platelets: 374 (ref 150–399)
WBC: 7.2

## 2020-04-04 LAB — CBC: RBC: 3.61 — AB (ref 3.87–5.11)

## 2020-04-04 LAB — COMPREHENSIVE METABOLIC PANEL
Calcium: 9.4 (ref 8.7–10.7)
GFR calc non Af Amer: 85

## 2020-04-05 ENCOUNTER — Encounter: Payer: Self-pay | Admitting: Internal Medicine

## 2020-04-05 ENCOUNTER — Non-Acute Institutional Stay (SKILLED_NURSING_FACILITY): Payer: Medicare Other | Admitting: Internal Medicine

## 2020-04-05 DIAGNOSIS — Z66 Do not resuscitate: Secondary | ICD-10-CM | POA: Diagnosis not present

## 2020-04-05 DIAGNOSIS — S12001D Unspecified nondisplaced fracture of first cervical vertebra, subsequent encounter for fracture with routine healing: Secondary | ICD-10-CM | POA: Diagnosis not present

## 2020-04-05 DIAGNOSIS — G629 Polyneuropathy, unspecified: Secondary | ICD-10-CM

## 2020-04-05 DIAGNOSIS — I10 Essential (primary) hypertension: Secondary | ICD-10-CM | POA: Diagnosis not present

## 2020-04-05 DIAGNOSIS — M978XXD Periprosthetic fracture around other internal prosthetic joint, subsequent encounter: Secondary | ICD-10-CM

## 2020-04-05 DIAGNOSIS — M810 Age-related osteoporosis without current pathological fracture: Secondary | ICD-10-CM

## 2020-04-05 DIAGNOSIS — Z96649 Presence of unspecified artificial hip joint: Secondary | ICD-10-CM

## 2020-04-05 DIAGNOSIS — G20 Parkinson's disease: Secondary | ICD-10-CM

## 2020-04-05 DIAGNOSIS — F320 Major depressive disorder, single episode, mild: Secondary | ICD-10-CM

## 2020-04-05 DIAGNOSIS — G20C Parkinsonism, unspecified: Secondary | ICD-10-CM

## 2020-04-05 DIAGNOSIS — Z8673 Personal history of transient ischemic attack (TIA), and cerebral infarction without residual deficits: Secondary | ICD-10-CM

## 2020-04-05 NOTE — Progress Notes (Signed)
Patient ID: Dana CossCarol Mcgregory, female   DOB: 02/23/1946, 74 y.o.   MRN: [id number]  Provider:  Gwenith Spitziffany L. Renato Gailseed, D.O., C.M.D. [facility] Room Number: 109P Place of Service:  SNF (31)  PCP: Kermit Baloeed, Liyat Faulkenberry L, DO Patient Care Team: Kermit Baloeed, Shana Younge L, DO as PCP - General (Geriatric Medicine)  Extended Emergency Contact Information Primary Emergency Contact: Annia FriendlyBrowning,Becky and Ken Address[address]          [place], [place] [address] [place] Home Phone: [phone] Relation: Friend Secondary Emergency Contact: Moishe Spiceholden, brock          [place], [place] [address] [place] Mobile Phone: [phone] Relation: Son  Code Status: DNR Goals of Care: Advanced Directive information Advanced Directives 04/03/2020  Does Patient Have a Medical Advance Directive? Yes  Type of Advance Directive Out of facility DNR (pink MOST or yellow form)  Does patient want to make changes to medical advance directive? No - Patient declined  Copy of Healthcare Power of Attorney in Chart? -  Pre-existing out of facility DNR order (yellow form or pink MOST form) Yellow form placed in chart (order not valid for inpatient use)   Chief Complaint  Patient presents with  . New Admit To SNF    New admission to SNF    HPI: Patient is a 74 y.o. female [facility] s/p left periprosthetic hip fracture and cervical spine fracture. She had been living at home alone since her husband died and she's been fiercely independent.  She manages her own finances and things.  She had a stroke that affected her balance and she's used various walkers to ambulate including a rollator which is her preference.  She had her fall and hit her forehead on grass first, then the rest of her--abrasions of knees, left hand, swelling of right hand since, but full use.  She was found to have the C1 fx and does have neck pain.  She's struggling to  sleep in the neck brace.  She has pain in the left hip fx also but no drainage, erythema or warmth at the site.  She has been working with PT.  She is eager to regain her independence and get home alone.  She's not thrilled with having help at home and definitely does not want to be "put somewhere" by her son who lives far away.  Past Medical History:  Diagnosis Date  . Anemia    none since hysterectomy  . Arthritis    LlTHA 11'15(had a fall)-"remains tender"  . Depression   . Heart murmur    history of  . Hyperlipidemia   . Hypertension   . Stroke [facility])    4-5 yrs ago(left sided weakness) -no residual  . Transfusion history    age 74- "hysterectomy"   Past Surgical History:  Procedure Laterality Date  . ABDOMINAL HYSTERECTOMY    . COLONOSCOPY WITH PROPOFOL N/A 12/25/2014   Procedure: COLONOSCOPY WITH PROPOFOL;  Surgeon: Charolett BumpersMartin K Johnson, MD;  Location: [facility] END[facility]COPY;  Service: Endoscopy;  Laterality: N/A;  . FRACTURE SURGERY  01/22/12   fx of eye socket from fall-"retained plate"  . HAND SURGERY Left    nerve release -many yrs ago  . HIP ARTHROPLASTY Left 09/16/2014   Procedure: ARTHROPLASTY BIPOLAR HIP;  Surgeon: Sheral Apleyimothy D Murphy, MD;  [facility];  Service: Orthopedics;  Laterality: Left;    Social History  Socioeconomic History  . Marital status: Widowed    Spouse name: Not on file  . Number of children: Not on file  . Years of education: Not on file  . Highest education level: Not on file  Occupational History  . Not on file  Tobacco Use  . Smoking status: Never Smoker  . Smokeless tobacco: Never Used  Substance and Sexual Activity  . Alcohol use: Yes    Comment: occasional beer  . Drug use: Never  . Sexual activity: Not on file  Other Topics Concern  . Not on file  Social History Narrative   Widowed.    Social Determinants of Health   Financial Resource Strain:   . Difficulty of Paying Living Expenses:   Food Insecurity:   . Worried About Ship broker in the Last Year:   . Arboriculturist in the Last Year:   Transportation Needs:   . Film/video editor (Medical):   Marland Kitchen Lack of Transportation (Non-Medical):   Physical Activity:   . Days of Exercise per Week:   . Minutes of Exercise per Session:   Stress:   . Feeling of Stress :   Social Connections:   . Frequency of Communication with Friends and Family:   . Frequency of Social Gatherings with Friends and Family:   . Attends Religious Services:   . Active Member of Clubs or Organizations:   . Attends Archivist Meetings:   Marland Kitchen Marital Status:     reports that she has never smoked. She has never used smokeless tobacco. She reports current alcohol use. She reports that she does not use drugs.  Functional Status Survey:    Family History  Problem Relation Age of Onset  . Hyperlipidemia Mother   . Hypertension Mother   . Diabetes Mother   . Heart disease Mother        aortic vavle  replaced  . Hypertension Father   . Hyperlipidemia Father   . Heart disease Father        aortic valve replaced  . Alzheimer's disease Father     Health Maintenance  Topic Date Due  . Hepatitis C Screening  Never done  . TETANUS/TDAP  Never done  . DEXA SCAN  Never done  . PNA vac Low Risk Adult (1 of 2 - PCV13) Never done  . INFLUENZA VACCINE  06/09/2020  . COLONOSCOPY  12/25/2024  . COVID-19 Vaccine  Completed    Allergies  Allergen Reactions  . Percodan [Oxycodone-Aspirin] Nausea Only  . Oxycodone Nausea Only    Outpatient Encounter Medications as of 04/05/2020  Medication Sig  . acetaminophen (TYLENOL) 500 MG tablet Take 2 tablets (1,000 mg total) by mouth every 8 (eight) hours as needed.  Marland Kitchen alendronate (FOSAMAX) 70 MG tablet Take 1 tablet (70 mg total) by mouth once a week. Take with 8 oz of water 30 minutes prior to food/meds. Do not lie down for 30 minutes.  Marland Kitchen amLODipine (NORVASC) 5 MG tablet Take 1 tablet (5 mg total) by mouth 2 (two) times daily.  .  ARIPiprazole (ABILIFY) 10 MG tablet Take 1 tablet (10 mg total) by mouth daily.  Marland Kitchen aspirin 81 MG chewable tablet Chew 81 mg by mouth daily.  Marland Kitchen atorvastatin (LIPITOR) 40 MG tablet Take 1 tablet (40 mg total) by mouth daily.  Marland Kitchen buPROPion (WELLBUTRIN XL) 150 MG 24 hr tablet Take 3 tablets (450 mg total) by mouth daily.  . carbidopa-levodopa (SINEMET IR) 25-100  MG tablet Take 1 tablet by mouth 3 (three) times daily.  . Cholecalciferol (VITAMIN D) 50 MCG (2000 UT) CAPS Take 2,000 Units by mouth daily.   Marland Kitchen gabapentin (NEURONTIN) 300 MG capsule Take 1 capsule (300 mg total) by mouth 3 (three) times daily.  Marland Kitchen ibuprofen (ADVIL) 400 MG tablet Take 1 tablet (400 mg total) by mouth every 6 (six) hours as needed for up to 7 days for mild pain.  Marland Kitchen lactose free nutrition (BOOST) LIQD Take 237 mLs by mouth 2 (two) times daily between meals.  . lidocaine (LIDODERM) 5 % Place 1 patch onto the skin daily. Remove & Discard patch within 12 hours or as directed by MD  . tiZANidine (ZANAFLEX) 2 MG tablet Take 1 tablet (2 mg total) by mouth 2 (two) times daily. QAM and QHS  . [DISCONTINUED] Melatonin 5 MG TABS Take 5 mg by mouth at bedtime.   . [DISCONTINUED] metoprolol tartrate (LOPRESSOR) 25 MG tablet Take 1 tablet (25 mg total) by mouth 2 (two) times daily.  . [DISCONTINUED] Multiple Vitamin (MULTIVITAMIN) tablet Take 1 tablet by mouth daily.  . [DISCONTINUED] pantoprazole (PROTONIX) 40 MG tablet Take 1 tablet (40 mg total) by mouth daily.  . [DISCONTINUED] sertraline (ZOLOFT) 100 MG tablet Take 2 tablets (200 mg total) by mouth daily.  . [DISCONTINUED] traZODone (DESYREL) 50 MG tablet Take 50 mg by mouth at bedtime as needed for sleep.   No facility-administered encounter medications on file as of 04/05/2020.    Review of Systems  Constitutional: Negative for chills, fever and malaise/fatigue.  HENT: Negative for congestion and sore throat.   Eyes: Negative for blurred vision.       Glasses  Respiratory:  Negative for cough and shortness of breath.   Cardiovascular: Negative for chest pain, palpitations and leg swelling.  Gastrointestinal: Negative for abdominal pain, blood in stool, constipation and melena.  Genitourinary: Negative for dysuria.  Musculoskeletal: Positive for falls, joint pain and neck pain.       Does not typically fall by her report, but did it well when she did--uses walker and was on driveway but forced herself to fall in such a way that her head struck the grass (on her right forehead)  Skin: Negative for itching and rash.  Neurological: Negative for dizziness and loss of consciousness.  Psychiatric/Behavioral: Negative for depression, memory loss and substance abuse. The patient is not nervous/anxious and does not have insomnia.     Vitals:   04/05/20 0847  BP: 110/61  Pulse: 74  Temp: (!) 97 F (36.1 C)  Weight: 113 lb (51.3 kg)  Height: 5\' 2"  (1.575 m)   Body mass index is 20.67 kg/m. Physical Exam Vitals and nursing note reviewed.  Constitutional:      General: She is not in acute distress.    Appearance: Normal appearance. She is not toxic-appearing.     Comments: Petite female seated in recliner  HENT:     Head: Normocephalic and atraumatic.     Right Ear: External ear normal.     Left Ear: External ear normal.     Nose: Nose normal.     Mouth/Throat:     Pharynx: Oropharynx is clear.  Eyes:     Comments: glasses  Cardiovascular:     Rate and Rhythm: Normal rate and regular rhythm.  Pulmonary:     Effort: Pulmonary effort is normal.     Breath sounds: Normal breath sounds.  Abdominal:     General: Bowel sounds  are normal. There is no distension.     Palpations: Abdomen is soft.     Tenderness: There is no abdominal tenderness. There is no guarding or rebound.  Musculoskeletal:     Right lower leg: No edema.     Left lower leg: No edema.  Skin:    General: Skin is warm and dry.     Comments: Mild swelling of left thigh; no significant  erythema or drainage outside of dressings intact  Neurological:     Mental Status: She is alert and oriented to person, place, and time.     Gait: Gait abnormal.     Comments: Right pill rolling tremor; mild LUE weakness   Psychiatric:        Mood and Affect: Mood normal.        Behavior: Behavior normal.        Thought Content: Thought content normal.        Judgment: Judgment normal.     Labs reviewed: Basic Metabolic Panel: Recent Labs    03/31/20 0259 04/01/20 0333 04/02/20 0310  NA 140 140 140  K 4.2 4.5 4.7  CL 105 104 108  CO2 24 29 22   GLUCOSE 103* 101* 105*  BUN 19 29* 34*  CREATININE 0.67 0.93 0.71  CALCIUM 9.0 8.7* 9.0  MG 1.9 2.1 2.0  PHOS 4.1 4.8* 4.4   Liver Function Tests: Recent Labs    03/31/20 0259 04/01/20 0333 04/02/20 0310  AST 20 27 24   ALT 5 7 8   ALKPHOS 72 75 81  BILITOT 0.4 0.5 0.6  PROT 6.3* 6.3* 6.2*  ALBUMIN 3.3* 3.2* 3.2*   No results for input(s): LIPASE, AMYLASE in the last 8760 hours. No results for input(s): AMMONIA in the last 8760 hours. CBC: Recent Labs    03/31/20 0259 04/01/20 0333 04/02/20 0310  WBC 6.3 7.5 8.2  NEUTROABS 3.7 4.1 5.1  HGB 10.6* 11.0* 10.9*  HCT 33.4* 34.6* 34.8*  MCV 89.8 90.8 90.6  PLT 353 363 342   Cardiac Enzymes: No results for input(s): CKTOTAL, CKMB, CKMBINDEX, TROPONINI in the last 8760 hours. BNP: Invalid input(s): POCBNP Lab Results  Component Value Date   HGBA1C (H) 03/11/2011    6.0 (NOTE)                                                                       According to the ADA Clinical Practice Recommendations for 2011, when HbA1c is used as a screening test:   >=6.5%   Diagnostic of Diabetes Mellitus           (if abnormal result  is confirmed)  5.7-6.4%   Increased risk of developing Diabetes Mellitus  References:Diagnosis and Classification of Diabetes Mellitus,Diabetes Care,2011,34(Suppl 1):S62-S69 and Standards of Medical Care in         Diabetes - 2011,Diabetes Care,2011,34    (Suppl 1):S11-S61.   Lab Results  Component Value Date   TSH 1.160 05/26/2018   Lab Results  Component Value Date   VITAMINB12 652 05/26/2018   No results found for: FOLATE No results found for: IRON, TIBC, FERRITIN  Imaging and Procedures obtained prior to SNF admission: CT Hip Left Wo Contrast  Result Date: 03/27/2020 CLINICAL DATA:  Left hip  pain. Constant pain. EXAM: CT OF THE LEFT HIP WITHOUT CONTRAST TECHNIQUE: Multidetector CT imaging of the left hip was performed according to the standard protocol. Multiplanar CT image reconstructions were also generated. COMPARISON:  None. FINDINGS: Bones/Joint/Cartilage Left total hip arthroplasty. No periarticular fluid collection or osteolysis. Acute nondisplaced fracture of the left greater trochanter extending obliquely and medially into the proximal femoral diaphysis. Normal alignment. No joint effusion. Mild osteoarthritis of the left SI joint. Ligaments Ligaments are suboptimally evaluated by CT. Muscles and Tendons No intramuscular hematoma.  Muscles are normal. Soft tissue No fluid collection or hematoma. No soft tissue mass. IMPRESSION: Left total hip arthroplasty. Acute nondisplaced fracture of the left greater trochanter extending obliquely and medially into the proximal femoral diaphysis. Electronically Signed   By: Elige Ko   On: 03/27/2020 14:02   DG CHEST PORT 1 VIEW  Result Date: 03/27/2020 CLINICAL DATA:  Preoperative hip fracture. EXAM: PORTABLE CHEST 1 VIEW COMPARISON:  September 25, 2019 FINDINGS: There is no evidence of acute infiltrate, pleural effusion or pneumothorax. The heart size and mediastinal contours are within normal limits. There is mild to moderate severity calcification of the thoracic aorta. The visualized skeletal structures are unremarkable. IMPRESSION: No active disease. Electronically Signed   By: Aram Candela M.D.   On: 03/27/2020 15:40   DG Hip Unilat W or Wo Pelvis 2-3 Views Left  Result Date:  03/27/2020 CLINICAL DATA:  Fall yesterday. Persistent left hip pain. Initial encounter. EXAM: DG HIP (WITH OR WITHOUT PELVIS) 2-3V LEFT COMPARISON:  05/29/2019 FINDINGS: Unipolar left hip prosthesis is seen in expected position. No evidence of fracture or dislocation. No other osseous abnormality identified. IMPRESSION: Left hip prosthesis in appropriate position.  No acute findings. Electronically Signed   By: Danae Orleans M.D.   On: 03/27/2020 12:13    Assessment/Plan 1. DNR (do not resuscitate) -updated order-- Do not attempt resuscitation (DNR)  2. Periprosthetic fracture of hip, subsequent encounter -reason for admission--had visit with Dr. Eulah Pont and here for rehab (touchdown weightbearing on left), f/u with him in 2-3 wks -doing great with tylenol for pain so continue it--also has tizanidine in event of muscle spasms and lidoderm available  3. Closed nondisplaced fracture of first cervical vertebra with routine healing, unspecified fracture morphology, subsequent encounter -continue aspen collar per neurosurgery recommendation and f/u in 6 wks as planned -is bothered by the collar when trying to rest at night -does report pain here and may use her tylenol, lidoderm  4. Essential hypertension -bp controlled with amlodipine 5mg  po bid (unusual regimen)  5. History of stroke -had left hemiplegia initially but improved considerably with just minimal left hand grip weakness now -uses walker and also struggles with parkinsonism which neurology indicates is likely primary--she is on sinemet -continues on statin and baby asa  6. Polyneuropathy -on gabapentin 300mg  po tid--has normal GFR  7. Parkinsonism, unspecified Parkinsonism type (HCC) -continue sinemet and neuro f/u  8.  Depression -she is on quite an extensive regimen which I would not have guessed talking with her (seemed happy and spunky)--wellbutrin, abilify and zoloft  9.  Senile osteoporosis -cont alendronate, D3,  be sure  calcium in normal range, only permitted touchdown weightbearing at present  Family/ staff Communication: discussed with [facility] Labs/tests ordered:  No new added  Ashon Rosenberg L. Orpheus [place], D.O. [facility] [address], [address] [place] Cell Phone (Mon-Fri 8am-5pm):  [phone] On Call:  [phone] & follow prompts after 5pm &  weekends Office Phone:  [phone] Office Fax:  [phone]

## 2020-05-02 ENCOUNTER — Non-Acute Institutional Stay (SKILLED_NURSING_FACILITY): Payer: Medicare Other | Admitting: Family

## 2020-05-02 ENCOUNTER — Encounter: Payer: Self-pay | Admitting: Family

## 2020-05-02 DIAGNOSIS — G20C Parkinsonism, unspecified: Secondary | ICD-10-CM

## 2020-05-02 DIAGNOSIS — F320 Major depressive disorder, single episode, mild: Secondary | ICD-10-CM

## 2020-05-02 DIAGNOSIS — G20 Parkinson's disease: Secondary | ICD-10-CM

## 2020-05-02 DIAGNOSIS — G629 Polyneuropathy, unspecified: Secondary | ICD-10-CM

## 2020-05-02 DIAGNOSIS — I10 Essential (primary) hypertension: Secondary | ICD-10-CM

## 2020-05-02 DIAGNOSIS — Z8673 Personal history of transient ischemic attack (TIA), and cerebral infarction without residual deficits: Secondary | ICD-10-CM

## 2020-05-02 DIAGNOSIS — S12001D Unspecified nondisplaced fracture of first cervical vertebra, subsequent encounter for fracture with routine healing: Secondary | ICD-10-CM | POA: Diagnosis not present

## 2020-05-02 DIAGNOSIS — M978XXD Periprosthetic fracture around other internal prosthetic joint, subsequent encounter: Secondary | ICD-10-CM

## 2020-05-02 DIAGNOSIS — R2681 Unsteadiness on feet: Secondary | ICD-10-CM

## 2020-05-02 DIAGNOSIS — G4700 Insomnia, unspecified: Secondary | ICD-10-CM

## 2020-05-02 DIAGNOSIS — M810 Age-related osteoporosis without current pathological fracture: Secondary | ICD-10-CM

## 2020-05-02 DIAGNOSIS — R296 Repeated falls: Secondary | ICD-10-CM

## 2020-05-02 DIAGNOSIS — Z96649 Presence of unspecified artificial hip joint: Secondary | ICD-10-CM

## 2020-05-04 ENCOUNTER — Other Ambulatory Visit: Payer: Self-pay | Admitting: Physician Assistant

## 2020-05-05 NOTE — Progress Notes (Signed)
[facility] Room Number: 109-P Place of Service:  SNF [phone]  Provider: Richarda Bladeinah Haley Roza FNP-C   PCP: Kermit Baloeed, Tiffany L, DO Patient Care Team: Kermit Baloeed, Tiffany L, DO as PCP - General (Geriatric Medicine)  Extended Emergency Contact Information Primary Emergency Contact: Annia FriendlyBrowning,Becky and Ken [address]          [place], [place] [address] [place] Home Phone: [phone] Relation: Friend Secondary Emergency Contact: Moishe Spiceholden, brock          [place], [place] [id number] [place] Mobile Phone: [phone] Relation: Son  Code Status: DNR Goals of care:  Advanced Directive information Advanced Directives 05/02/2020  Does Patient Have a Medical Advance Directive? Yes  Type of Advance Directive Living will;Out of facility DNR (pink MOST or yellow form)  Does patient want to make changes to medical advance directive? No - Patient declined  Copy of Healthcare Power of Attorney in Chart? -  Pre-existing out of facility DNR order (yellow form or pink MOST form) Yellow form placed in chart (order not valid for inpatient use)     Allergies  Allergen Reactions  . Percodan [Oxycodone-Aspirin] Nausea Only  . Oxycodone Nausea Only    Chief Complaint  Patient presents with  . Discharge Note    Discharge from facility    HPI:  74 y.o. female [facility].she has a significant medical history of Hypertension,Hyperlipidemia,depression, Parkinson disease,hx of stroke with left sided weakness,Osteoporosis,recurrent falls among other conditions.she is seen in her room sitting on recliner.She was here for short term rehabilitation for post hospital admission 03/27/2020 - 04/02/2020 for left greater trochanter.she lost balance fell on her left shoulder.of note she fell 3 additional times after her initial fall episode.she was evaluated in the ED 03/26/2020.she complained of mild lower cervical  neck pain and shoulder pain worst with movement.she had no distal numbness,tingling or weakness.No loss of consciousness.Left shoulder X-ray was negative.CT of her C-spine showed non-displaced fracture.Neurosurgery on -call was consulted Aspen collar recommended and follow up outpatient.she went home but was unable to walk a neighbor had to carry her inside.she went back to ED due to left leg pain and inability to walk.X-ray showed nondisplaced fracture of the left greater trochanter extending obliquely and medially into the proximal femoral diaphysis.she was seen by Dr.Murphy who recommended non-operative management of left prosthetic hip fracture.touch down weight bearing.she was discharged to SNF.She has worked well with PT/OT now stable for discharge home.She will be discharged home with Home health PT/OT to continue with ROM, Exercise, Gait stability and muscle strengthening.She does not require  DME has FWW to allow her to maintain current level of independence with ADL's. Home health services will be arranged by facility social worker prior to discharge. Prescription medication will be written x 1 month then patient to follow up with PCP in 1-2 weeks.She denies any acute issues this visit. Facility staff report no new concerns.    Past Medical History:  Diagnosis Date  . Anemia    none since hysterectomy  . Arthritis    LlTHA 11'15(had a fall)-"remains tender"  . Depression   . Heart murmur    history of  . Hyperlipidemia   . Hypertension   . Stroke [facility](HCC)    4-5 yrs ago(left sided weakness) -no residual  . Transfusion history    age 74- "hysterectomy"    Past Surgical History:  Procedure Laterality Date  . ABDOMINAL HYSTERECTOMY    .  COLONOSCOPY WITH PROPOFOL N/A 12/25/2014   Procedure: COLONOSCOPY WITH PROPOFOL;  Surgeon: Charolett Bumpers, MD;  Location: [facility] ENDOSCOPY;  Service: Endoscopy;  Laterality: N/A;  . FRACTURE SURGERY  01/22/12   fx of eye socket from fall-"retained plate"  .  HAND SURGERY Left    nerve release -many yrs ago  . HIP ARTHROPLASTY Left 09/16/2014   Procedure: ARTHROPLASTY BIPOLAR HIP;  Surgeon: Sheral Apley, MD;  Location: [facility] OR;  Service: Orthopedics;  Laterality: Left;      reports that she has never smoked. She has never used smokeless tobacco. She reports current alcohol use. She reports that she does not use drugs. Social History   Socioeconomic History  . Marital status: Widowed    Spouse name: Not on file  . Number of children: Not on file  . Years of education: Not on file  . Highest education level: Not on file  Occupational History  . Not on file  Tobacco Use  . Smoking status: Never Smoker  . Smokeless tobacco: Never Used  Vaping Use  . Vaping Use: Never used  Substance and Sexual Activity  . Alcohol use: Yes    Comment: occasional beer  . Drug use: Never  . Sexual activity: Not on file  Other Topics Concern  . Not on file  Social History Narrative   Widowed.    Social Determinants of Health   Financial Resource Strain:   . Difficulty of Paying Living Expenses:   Food Insecurity:   . Worried About Programme researcher, broadcasting/film/video in the Last Year:   . Barista in the Last Year:   Transportation Needs:   . Freight forwarder (Medical):   Marland Kitchen Lack of Transportation (Non-Medical):   Physical Activity:   . Days of Exercise per Week:   . Minutes of Exercise per Session:   Stress:   . Feeling of Stress :   Social Connections:   . Frequency of Communication with Friends and Family:   . Frequency of Social Gatherings with Friends and Family:   . Attends Religious Services:   . Active Member of Clubs or Organizations:   . Attends Banker Meetings:   Marland Kitchen Marital Status:   Intimate Partner Violence:   . Fear of Current or Ex-Partner:   . Emotionally Abused:   Marland Kitchen Physically Abused:   . Sexually Abused:       Allergies  Allergen Reactions  . Percodan [Oxycodone-Aspirin] Nausea Only  . Oxycodone Nausea  Only    Pertinent  Health Maintenance Due  Topic Date Due  . DEXA SCAN  Never done  . PNA vac Low Risk Adult (1 of 2 - PCV13) Never done  . INFLUENZA VACCINE  06/09/2020  . COLONOSCOPY  12/25/2024    Medications: Outpatient Encounter Medications as of 05/02/2020  Medication Sig  . acetaminophen (TYLENOL) 500 MG tablet Take 2 tablets (1,000 mg total) by mouth every 8 (eight) hours as needed.  Marland Kitchen alendronate (FOSAMAX) 70 MG tablet Take 1 tablet (70 mg total) by mouth once a week. Take with 8 oz of water 30 minutes prior to food/meds. Do not lie down for 30 minutes.  Marland Kitchen amLODipine (NORVASC) 5 MG tablet Take 1 tablet (5 mg total) by mouth 2 (two) times daily.  . ARIPiprazole (ABILIFY) 10 MG tablet Take 1 tablet (10 mg total) by mouth daily.  Marland Kitchen aspirin 81 MG chewable tablet Chew 81 mg by mouth daily.  Marland Kitchen atorvastatin (LIPITOR) 40 MG  tablet Take 1 tablet (40 mg total) by mouth daily.  . bisacodyl (FLEET) 10 MG/30ML ENEM Place 10 mg rectally once. If Milk Of Magnesium P.O doesn't work. Give 10mg  of Bisacodyl rectally.  Marland Kitchen buPROPion (WELLBUTRIN XL) 150 MG 24 hr tablet Take 3 tablets (450 mg total) by mouth daily.  . carbidopa-levodopa (SINEMET IR) 25-100 MG tablet Take 1 tablet by mouth 3 (three) times daily.  . Cholecalciferol (VITAMIN D) 50 MCG (2000 UT) CAPS Take 2,000 Units by mouth daily.   Marland Kitchen gabapentin (NEURONTIN) 300 MG capsule Take 1 capsule (300 mg total) by mouth 3 (three) times daily.  Marland Kitchen lactose free nutrition (BOOST) LIQD Take 237 mLs by mouth 2 (two) times daily between meals.  . lidocaine (LIDODERM) 5 % Place 1 patch onto the skin daily. Remove & Discard patch within 12 hours or as directed by MD  . melatonin 5 MG TABS Take 5 mg by mouth.  . metoprolol tartrate (LOPRESSOR) 25 MG tablet Take 25 mg by mouth 2 (two) times daily.  . Multiple Vitamin (MULTIVITAMIN) tablet Take 1 tablet by mouth daily.  Marland Kitchen OVER THE COUNTER MEDICATION 30 mLs daily. Milk Of Magnesium P.O.  . pantoprazole  (PROTONIX) 40 MG tablet Take 40 mg by mouth daily.  . sertraline (ZOLOFT) 100 MG tablet Take 100 mg by mouth daily.  Marland Kitchen tiZANidine (ZANAFLEX) 2 MG tablet Take 1 tablet (2 mg total) by mouth 2 (two) times daily. QAM and QHS   No facility-administered encounter medications on file as of 05/02/2020.     Review of Systems  Constitutional: Negative for chills, fever, malaise/fatigue and weight loss.  HENT: Negative for congestion, ear pain, sinus pain and sore throat.   Eyes: Negative for blurred vision, double vision, pain, discharge and redness.  Respiratory: Negative for cough, shortness of breath and wheezing.   Cardiovascular: Negative for chest pain, palpitations and leg swelling.  Gastrointestinal: Negative for abdominal pain, constipation, diarrhea, melena, nausea and vomiting.  Genitourinary: Negative for dysuria, flank pain, frequency, hematuria and urgency.  Musculoskeletal: Positive for neck pain. Negative for joint pain and myalgias.       Hx of frequent falls.no fall episode during rehab stay  Skin: Negative for itching and rash.  Neurological: Negative for dizziness, seizures, loss of consciousness and headaches.       Hx neuropathy,left sided weakness   Endo/Heme/Allergies: Does not bruise/bleed easily.  Psychiatric/Behavioral: Negative for depression, memory loss, substance abuse and suicidal ideas. The patient is not nervous/anxious and does not have insomnia.     Vitals:   05/02/20 1551  BP: 114/66  Pulse: 94  Resp: 18  Temp: (!) 97.5 F (36.4 C)  SpO2: 96%  Weight: 117 lb 3.2 oz (53.2 kg)  Height: 5\' 2"  (1.575 m)   Body mass index is 21.44 kg/m. Physical Exam Vitals reviewed.  Constitutional:      General: She is not in acute distress.    Appearance: She is not ill-appearing.  HENT:     Head: Normocephalic.     Nose: Nose normal. No congestion or rhinorrhea.     Mouth/Throat:     Mouth: Mucous membranes are moist.     Pharynx: Oropharynx is clear. No  oropharyngeal exudate or posterior oropharyngeal erythema.  Eyes:     General: No scleral icterus.       Right eye: No discharge.        Left eye: No discharge.     Extraocular Movements: Extraocular movements intact.  Conjunctiva/sclera: Conjunctivae normal.     Pupils: Pupils are equal, round, and reactive to light.  Neck:     Vascular: No carotid bruit.  Cardiovascular:     Rate and Rhythm: Normal rate and regular rhythm.     Pulses: Normal pulses.     Heart sounds: Normal heart sounds. No murmur heard.  No friction rub. No gallop.   Pulmonary:     Effort: Pulmonary effort is normal. No respiratory distress.     Breath sounds: Normal breath sounds. No wheezing, rhonchi or rales.  Chest:     Chest wall: No tenderness.  Abdominal:     General: Bowel sounds are normal. There is no distension.     Palpations: Abdomen is soft. There is no mass.     Tenderness: There is no abdominal tenderness. There is no right CVA tenderness, left CVA tenderness, guarding or rebound.  Musculoskeletal:        General: No swelling or tenderness.     Cervical back: Normal range of motion. No rigidity or tenderness.     Right lower leg: No edema.     Left lower leg: No edema.     Comments: Unsteady gait ambulates with walker   Lymphadenopathy:     Cervical: No cervical adenopathy.  Skin:    General: Skin is warm and dry.     Coloration: Skin is not pale.     Findings: No bruising, erythema or rash.  Neurological:     Mental Status: She is alert and oriented to person, place, and time.     Cranial Nerves: No cranial nerve deficit.     Motor: No weakness.     Gait: Gait abnormal.  Psychiatric:        Mood and Affect: Mood normal.        Behavior: Behavior normal.        Thought Content: Thought content normal.        Judgment: Judgment normal.    Labs reviewed: Basic Metabolic Panel: Recent Labs    03/31/20 0259 03/31/20 0259 04/01/20 0333 04/02/20 0310 04/04/20 0000  NA 140   < >  140 140 141  K 4.2   < > 4.5 4.7 4.6  CL 105   < > 104 108 105  CO2 24   < > 29 22 23*  GLUCOSE 103*  --  101* 105*  --   BUN 19   < > 29* 34* 16  CREATININE 0.67   < > 0.93 0.71 0.7  CALCIUM 9.0   < > 8.7* 9.0 9.4  MG 1.9  --  2.1 2.0  --   PHOS 4.1  --  4.8* 4.4  --    < > = values in this interval not displayed.   Liver Function Tests: Recent Labs    03/31/20 0259 04/01/20 0333 04/02/20 0310  AST 20 27 24   ALT 5 7 8   ALKPHOS 72 75 81  BILITOT 0.4 0.5 0.6  PROT 6.3* 6.3* 6.2*  ALBUMIN 3.3* 3.2* 3.2*    CBC: Recent Labs    03/31/20 0259 03/31/20 0259 04/01/20 0333 04/02/20 0310 04/04/20 0000  WBC 6.3   < > 7.5 8.2 7.2  NEUTROABS 3.7   < > 4.1 5.1 4  HGB 10.6*   < > 11.0* 10.9* 10.4*  HCT 33.4*   < > 34.6* 34.8* 30*  MCV 89.8  --  90.8 90.6  --   PLT 353   < > 363  342 374   < > = values in this interval not displayed.   Procedures and Imaging Studies During Stay: No results found.  Assessment/Plan:   1. Essential hypertension B/p stable.continue on metoprolol tartrate 25 mg tablet twice daily,Amlodipine 5 mg tablet twice daily.   2. Closed nondisplaced fracture of first cervical vertebra with routine healing, unspecified fracture morphology, subsequent encounter Status post fall post hospital admission as above.Neurosurgery recommended Aspen collar.Collar in place during visit. - follow up with Neurosurgery as directed. - continue on Tylenol 1000 mg tablet every 8 Hrs as needed and Tizanidine 2 mg tablet twice daily.  3. Periprosthetic fracture of hip, subsequent encounter S/p hospital admission post fall for left greater trochanter. -Pain under controlled.continue on continue on Tylenol 1000 mg tablet every 8 Hrs as needed.  4. Current mild episode of major depressive disorder without prior episode (HCC) Mood stable. - continue on bupropion 150 mg 24 HR tablet 450 mg tablet daily,sertraline 100 mg tablet daily and Abilify 10 mg tablet daily.  5. Senile  osteoporosis No Bone density on chart for review.will defer to PCP. - continue on alendronate 70 mg tablet weekly. - continue on vitamin D 2000 units daily.  6. Polyneuropathy Continue on Gabapentin 300 mg capsule three times daily.   7. Insomnia, unspecified type Continue on Melatonin 5 mg tablet daily   8. Parkinsonism, unspecified Parkinsonism type (HCC) - continue on sinemet IR 25-100 mg tablet one by mouth three times daily. -continue to follow up with Neurologist.   9. Unsteady gait Has worked well with PT/ OT.Will discharge home PT/OT to continue with ROM, Exercise, Gait stability and muscle strengthening.  - continue to ambulate with FWW to allow her to maintain current level of independence with ADL's.    10. Recurrent falls - discharge home PT/OT to continue with ROM, Exercise, Gait stability and muscle strengthening. -  Fall and safety precautions advised.  11.Hx of stroke with left sided weakness  - continue on ASA 81 mg tablet daily and Atorvastatin 40 mg tablet daily.  - continue on Tizanidine 2 mg tablet twice daily   Patient is being discharged with the following home health services:   -PT/OT for ROM, exercise, gait stability and muscle strengthening  Patient is being discharged with the following durable medical equipment:   None.Has a walker.   Patient has been advised to f/u with their PCP in 1-2 weeks to for a transitions of care visit.Social services at their facility was responsible for arranging this appointment.Pt was provided with adequate prescriptions of noncontrolled medications to reach the scheduled appointment.For controlled substances, a limited supply was provided as appropriate for the individual patient. If the pt normally receives these medications from a pain clinic or has a contract with another physician, these medications should be received from that clinic or physician only).    Future labs/tests needed:  CBC, BMP in 1-2 weeks PCP

## 2020-05-10 ENCOUNTER — Other Ambulatory Visit: Payer: Self-pay | Admitting: Physician Assistant

## 2020-05-19 ENCOUNTER — Emergency Department (HOSPITAL_BASED_OUTPATIENT_CLINIC_OR_DEPARTMENT_OTHER): Payer: Medicare Other

## 2020-05-19 ENCOUNTER — Inpatient Hospital Stay (HOSPITAL_BASED_OUTPATIENT_CLINIC_OR_DEPARTMENT_OTHER)
Admission: EM | Admit: 2020-05-19 | Discharge: 2020-05-23 | DRG: 640 | Disposition: A | Discharge status: Skilled Nursing Facility | Payer: Medicare Other | Attending: Family Medicine | Admitting: Family Medicine

## 2020-05-19 ENCOUNTER — Other Ambulatory Visit: Payer: Self-pay

## 2020-05-19 ENCOUNTER — Encounter (HOSPITAL_BASED_OUTPATIENT_CLINIC_OR_DEPARTMENT_OTHER): Payer: Self-pay

## 2020-05-19 DIAGNOSIS — Z86718 Personal history of other venous thrombosis and embolism: Secondary | ICD-10-CM | POA: Diagnosis not present

## 2020-05-19 DIAGNOSIS — M9702XD Periprosthetic fracture around internal prosthetic left hip joint, subsequent encounter: Secondary | ICD-10-CM

## 2020-05-19 DIAGNOSIS — E860 Dehydration: Secondary | ICD-10-CM | POA: Diagnosis present

## 2020-05-19 DIAGNOSIS — R410 Disorientation, unspecified: Secondary | ICD-10-CM

## 2020-05-19 DIAGNOSIS — Z8249 Family history of ischemic heart disease and other diseases of the circulatory system: Secondary | ICD-10-CM | POA: Diagnosis not present

## 2020-05-19 DIAGNOSIS — R531 Weakness: Secondary | ICD-10-CM

## 2020-05-19 DIAGNOSIS — G20 Parkinson's disease: Secondary | ICD-10-CM | POA: Diagnosis present

## 2020-05-19 DIAGNOSIS — S72002A Fracture of unspecified part of neck of left femur, initial encounter for closed fracture: Secondary | ICD-10-CM | POA: Diagnosis present

## 2020-05-19 DIAGNOSIS — G9341 Metabolic encephalopathy: Secondary | ICD-10-CM | POA: Diagnosis present

## 2020-05-19 DIAGNOSIS — I69354 Hemiplegia and hemiparesis following cerebral infarction affecting left non-dominant side: Secondary | ICD-10-CM | POA: Diagnosis not present

## 2020-05-19 DIAGNOSIS — W19XXXD Unspecified fall, subsequent encounter: Secondary | ICD-10-CM | POA: Diagnosis present

## 2020-05-19 DIAGNOSIS — Z833 Family history of diabetes mellitus: Secondary | ICD-10-CM

## 2020-05-19 DIAGNOSIS — S72002D Fracture of unspecified part of neck of left femur, subsequent encounter for closed fracture with routine healing: Secondary | ICD-10-CM

## 2020-05-19 DIAGNOSIS — W19XXXA Unspecified fall, initial encounter: Secondary | ICD-10-CM | POA: Diagnosis present

## 2020-05-19 DIAGNOSIS — M1990 Unspecified osteoarthritis, unspecified site: Secondary | ICD-10-CM | POA: Diagnosis present

## 2020-05-19 DIAGNOSIS — I10 Essential (primary) hypertension: Secondary | ICD-10-CM | POA: Diagnosis present

## 2020-05-19 DIAGNOSIS — S12001D Unspecified nondisplaced fracture of first cervical vertebra, subsequent encounter for fracture with routine healing: Secondary | ICD-10-CM | POA: Diagnosis not present

## 2020-05-19 DIAGNOSIS — Z885 Allergy status to narcotic agent status: Secondary | ICD-10-CM

## 2020-05-19 DIAGNOSIS — Z820 Family history of epilepsy and other diseases of the nervous system: Secondary | ICD-10-CM

## 2020-05-19 DIAGNOSIS — K219 Gastro-esophageal reflux disease without esophagitis: Secondary | ICD-10-CM | POA: Diagnosis present

## 2020-05-19 DIAGNOSIS — Z7982 Long term (current) use of aspirin: Secondary | ICD-10-CM

## 2020-05-19 DIAGNOSIS — Z79899 Other long term (current) drug therapy: Secondary | ICD-10-CM

## 2020-05-19 DIAGNOSIS — Z66 Do not resuscitate: Secondary | ICD-10-CM | POA: Diagnosis present

## 2020-05-19 DIAGNOSIS — Z20822 Contact with and (suspected) exposure to covid-19: Secondary | ICD-10-CM | POA: Diagnosis present

## 2020-05-19 DIAGNOSIS — Z8673 Personal history of transient ischemic attack (TIA), and cerebral infarction without residual deficits: Secondary | ICD-10-CM | POA: Diagnosis not present

## 2020-05-19 DIAGNOSIS — Z96642 Presence of left artificial hip joint: Secondary | ICD-10-CM | POA: Diagnosis present

## 2020-05-19 DIAGNOSIS — E785 Hyperlipidemia, unspecified: Secondary | ICD-10-CM | POA: Diagnosis present

## 2020-05-19 DIAGNOSIS — F329 Major depressive disorder, single episode, unspecified: Secondary | ICD-10-CM | POA: Diagnosis present

## 2020-05-19 DIAGNOSIS — Z83438 Family history of other disorder of lipoprotein metabolism and other lipidemia: Secondary | ICD-10-CM

## 2020-05-19 DIAGNOSIS — G9340 Encephalopathy, unspecified: Secondary | ICD-10-CM | POA: Insufficient documentation

## 2020-05-19 DIAGNOSIS — M810 Age-related osteoporosis without current pathological fracture: Secondary | ICD-10-CM | POA: Diagnosis present

## 2020-05-19 DIAGNOSIS — Z7983 Long term (current) use of bisphosphonates: Secondary | ICD-10-CM

## 2020-05-19 DIAGNOSIS — R32 Unspecified urinary incontinence: Secondary | ICD-10-CM | POA: Diagnosis present

## 2020-05-19 HISTORY — DX: Parkinson's disease: G20

## 2020-05-19 HISTORY — DX: Parkinson's disease without dyskinesia, without mention of fluctuations: G20.A1

## 2020-05-19 LAB — URINALYSIS, ROUTINE W REFLEX MICROSCOPIC
Glucose, UA: NEGATIVE mg/dL
Hgb urine dipstick: NEGATIVE
Ketones, ur: >80 mg/dL — AB
Nitrite: NEGATIVE
Protein, ur: NEGATIVE mg/dL
Specific Gravity, Urine: >1.03 — ABNORMAL HIGH (ref 1.005–1.030)
pH: 5.5 (ref 5.0–8.0)

## 2020-05-19 LAB — COMPREHENSIVE METABOLIC PANEL
ALT: 48 U/L — ABNORMAL HIGH (ref 0–44)
AST: 45 U/L — ABNORMAL HIGH (ref 15–41)
Albumin: 4.1 g/dL (ref 3.5–5.0)
Alkaline Phosphatase: 124 U/L (ref 38–126)
Anion gap: 15 (ref 5–15)
BUN: 20 mg/dL (ref 8–23)
CO2: 18 mmol/L — ABNORMAL LOW (ref 22–32)
Calcium: 9 mg/dL (ref 8.9–10.3)
Chloride: 104 mmol/L (ref 98–111)
Creatinine, Ser: 0.99 mg/dL (ref 0.44–1.00)
GFR calc Af Amer: >60 mL/min (ref >60)
GFR calc non Af Amer: 56 mL/min — ABNORMAL LOW (ref >60)
Glucose, Bld: 104 mg/dL — ABNORMAL HIGH (ref 70–99)
Potassium: 4.2 mmol/L (ref 3.5–5.1)
Sodium: 137 mmol/L (ref 135–145)
Total Bilirubin: 1.1 mg/dL (ref 0.3–1.2)
Total Protein: 7.9 g/dL (ref 6.5–8.1)

## 2020-05-19 LAB — URINALYSIS, MICROSCOPIC (REFLEX)

## 2020-05-19 LAB — CBC
HCT: 39.4 % (ref 36.0–46.0)
Hemoglobin: 12.6 g/dL (ref 12.0–15.0)
MCH: 28.6 pg (ref 26.0–34.0)
MCHC: 32 g/dL (ref 30.0–36.0)
MCV: 89.5 fL (ref 80.0–100.0)
Platelets: 457 10*3/uL — ABNORMAL HIGH (ref 150–400)
RBC: 4.4 MIL/uL (ref 3.87–5.11)
RDW: 14 % (ref 11.5–15.5)
WBC: 6.6 10*3/uL (ref 4.0–10.5)
nRBC: 0 % (ref 0.0–0.2)

## 2020-05-19 LAB — CK: Total CK: 245 U/L — ABNORMAL HIGH (ref 38–234)

## 2020-05-19 LAB — SARS CORONAVIRUS 2 BY RT PCR (HOSPITAL ORDER, PERFORMED IN ~~LOC~~ HOSPITAL LAB): SARS Coronavirus 2: NEGATIVE

## 2020-05-19 LAB — CBG MONITORING, ED
Glucose-Capillary: 120 mg/dL — ABNORMAL HIGH (ref 70–99)
Glucose-Capillary: 83 mg/dL (ref 70–99)

## 2020-05-19 MED ORDER — POLYETHYLENE GLYCOL 3350 17 G PO PACK
17.0000 g | PACK | Freq: Every day | ORAL | Status: DC | PRN
  Administered 2020-05-22: 17 g via ORAL
  Filled 2020-05-19: qty 1

## 2020-05-19 MED ORDER — ONDANSETRON HCL 4 MG/2ML IJ SOLN: 4.0000 mg | Freq: Four times a day (QID) | INTRAMUSCULAR | Status: DC | PRN

## 2020-05-19 MED ORDER — PANTOPRAZOLE SODIUM 40 MG PO TBEC
40.0000 mg | DELAYED_RELEASE_TABLET | Freq: Every day | ORAL | Status: DC
  Administered 2020-05-19 – 2020-05-23 (×5): 40 mg via ORAL
  Filled 2020-05-19 (×5): qty 1

## 2020-05-19 MED ORDER — ADULT MULTIVITAMIN W/MINERALS CH
1.0000 | ORAL_TABLET | Freq: Every day | ORAL | Status: DC
  Administered 2020-05-19 – 2020-05-23 (×5): 1 via ORAL
  Filled 2020-05-19 (×5): qty 1

## 2020-05-19 MED ORDER — CARBIDOPA-LEVODOPA 25-100 MG PO TABS
1.0000 | ORAL_TABLET | Freq: Three times a day (TID) | ORAL | Status: DC
  Administered 2020-05-19 – 2020-05-23 (×13): 1 via ORAL
  Filled 2020-05-19 (×14): qty 1

## 2020-05-19 MED ORDER — ATORVASTATIN CALCIUM 40 MG PO TABS
40.0000 mg | ORAL_TABLET | Freq: Every day | ORAL | Status: DC
  Administered 2020-05-19 – 2020-05-23 (×5): 40 mg via ORAL
  Filled 2020-05-19 (×5): qty 1

## 2020-05-19 MED ORDER — METOPROLOL TARTRATE 5 MG/5ML IV SOLN: 5.0000 mg | Freq: Four times a day (QID) | INTRAVENOUS | Status: DC | PRN

## 2020-05-19 MED ORDER — ENSURE ENLIVE PO LIQD
237.0000 mL | Freq: Two times a day (BID) | ORAL | Status: DC
  Administered 2020-05-20 – 2020-05-23 (×8): 237 mL via ORAL

## 2020-05-19 MED ORDER — TRAZODONE HCL 50 MG PO TABS
50.0000 mg | ORAL_TABLET | Freq: Every evening | ORAL | Status: DC | PRN
  Administered 2020-05-20: 50 mg via ORAL
  Filled 2020-05-19: qty 1

## 2020-05-19 MED ORDER — BUPROPION HCL ER (XL) 300 MG PO TB24
450.0000 mg | ORAL_TABLET | Freq: Every day | ORAL | Status: DC
  Administered 2020-05-20 – 2020-05-23 (×4): 450 mg via ORAL
  Filled 2020-05-19 (×5): qty 1

## 2020-05-19 MED ORDER — ACETAMINOPHEN 325 MG PO TABS
650.0000 mg | ORAL_TABLET | Freq: Four times a day (QID) | ORAL | Status: DC | PRN
  Administered 2020-05-21 – 2020-05-23 (×4): 650 mg via ORAL
  Filled 2020-05-19 (×4): qty 2

## 2020-05-19 MED ORDER — MELATONIN 5 MG PO TABS
5.0000 mg | ORAL_TABLET | Freq: Every day | ORAL | Status: DC
  Administered 2020-05-19 – 2020-05-22 (×4): 5 mg via ORAL
  Filled 2020-05-19 (×4): qty 1

## 2020-05-19 MED ORDER — LIP MEDEX EX OINT
TOPICAL_OINTMENT | CUTANEOUS | Status: AC
  Administered 2020-05-19: 1
  Filled 2020-05-19: qty 7

## 2020-05-19 MED ORDER — SERTRALINE HCL 100 MG PO TABS
200.0000 mg | ORAL_TABLET | Freq: Every day | ORAL | Status: DC
  Administered 2020-05-20 – 2020-05-23 (×4): 200 mg via ORAL
  Filled 2020-05-19 (×4): qty 2

## 2020-05-19 MED ORDER — ENOXAPARIN SODIUM 40 MG/0.4ML ~~LOC~~ SOLN
40.0000 mg | SUBCUTANEOUS | Status: DC
  Administered 2020-05-19 – 2020-05-22 (×4): 40 mg via SUBCUTANEOUS
  Filled 2020-05-19 (×4): qty 0.4

## 2020-05-19 MED ORDER — BISACODYL 10 MG RE SUPP
10.0000 mg | Freq: Once | RECTAL | Status: DC
  Filled 2020-05-19: qty 1

## 2020-05-19 MED ORDER — GABAPENTIN 300 MG PO CAPS
300.0000 mg | ORAL_CAPSULE | Freq: Three times a day (TID) | ORAL | Status: DC
  Administered 2020-05-19 – 2020-05-23 (×14): 300 mg via ORAL
  Filled 2020-05-19 (×14): qty 1

## 2020-05-19 MED ORDER — ALENDRONATE SODIUM 70 MG PO TABS: 70.0000 mg | ORAL_TABLET | ORAL | Status: DC

## 2020-05-19 MED ORDER — ONDANSETRON HCL 4 MG PO TABS: 4.0000 mg | ORAL_TABLET | Freq: Four times a day (QID) | ORAL | Status: DC | PRN

## 2020-05-19 MED ORDER — ARIPIPRAZOLE 10 MG PO TABS
10.0000 mg | ORAL_TABLET | Freq: Every day | ORAL | Status: DC
  Administered 2020-05-20 – 2020-05-23 (×4): 10 mg via ORAL
  Filled 2020-05-19 (×5): qty 1

## 2020-05-19 MED ORDER — ASPIRIN 81 MG PO CHEW
81.0000 mg | CHEWABLE_TABLET | Freq: Every day | ORAL | Status: DC
  Administered 2020-05-19 – 2020-05-23 (×5): 81 mg via ORAL
  Filled 2020-05-19 (×5): qty 1

## 2020-05-19 MED ORDER — METOPROLOL TARTRATE 25 MG PO TABS
25.0000 mg | ORAL_TABLET | Freq: Two times a day (BID) | ORAL | Status: DC
  Administered 2020-05-19: 25 mg via ORAL
  Filled 2020-05-19: qty 1

## 2020-05-19 MED ORDER — ACETAMINOPHEN 650 MG RE SUPP: 650.0000 mg | Freq: Four times a day (QID) | RECTAL | Status: DC | PRN

## 2020-05-19 MED ORDER — AMLODIPINE BESYLATE 5 MG PO TABS
5.0000 mg | ORAL_TABLET | Freq: Two times a day (BID) | ORAL | Status: DC
  Administered 2020-05-19 (×2): 5 mg via ORAL
  Filled 2020-05-19 (×2): qty 1

## 2020-05-19 MED ORDER — VITAMIN D 25 MCG (1000 UNIT) PO TABS
2000.0000 [IU] | ORAL_TABLET | Freq: Every day | ORAL | Status: DC
  Administered 2020-05-19 – 2020-05-23 (×5): 2000 [IU] via ORAL
  Filled 2020-05-19 (×5): qty 2

## 2020-05-19 MED ORDER — LACTATED RINGERS IV SOLN: INTRAVENOUS | Status: DC

## 2020-05-19 MED ORDER — FENTANYL CITRATE (PF) 100 MCG/2ML IJ SOLN
12.5000 ug | INTRAMUSCULAR | Status: DC | PRN
  Administered 2020-05-22: 12.5 ug via INTRAVENOUS
  Filled 2020-05-19: qty 2

## 2020-05-19 NOTE — H&P (Addendum)
History and Physical    Dana CossCarol Salvetti WJX:[id number] DOB: 12/25/1945 DOA: 05/19/2020  PCP: Juluis RainierBarnes, [name], MD  Patient coming from: Home  I have personally briefly reviewed patient's [facility]  Chief Complaint: Confusion  HPI: Dana [name] is a 74 y.o. female with medical history significant of hypertension, Parkinson's like tremor, hyperlipidemia, depression, CVA with residual left-sided weakness, anemia, osteoporosis, who was hospitalized in May for [facility] for C-spine fracture after falling.  She has been in an Biochemist, clinicalAspen collar since that time which she continues to wear.  She did not require surgery for hip fracture but went to inpatient rehab where she was getting physical therapy.  She has been home from the SNF since last week and apparently has had a lot of trouble with her ADLs since then.  It is unclear that she is able to get up, feed herself.  She is incontinent of urine at baseline.  A nurse from the nursing facility has been going by her house and has arranged for home PT who came for the first time on 7/9.  When EMS found her there was a question of whether or not the patient had moved or eaten in the last 24 hours.  ED Course: In the ED she was noted to have an elevated CK at 245 other labs appeared normal.  CT of the head was negative.  She did seem somewhat confused according to the EDP.  We are asked to admit for completion of work-up, including MR, CT of the spine, IV fluids for possible rhabdo, PT/OT eval and decision around placement.  Review of Systems: As per HPI otherwise 10 point review of systems negative.   Past Medical History:  Diagnosis Date  . Anemia    none since hysterectomy  . Arthritis    LlTHA 11'15(had a fall)-"remains tender"  . Depression   . Heart murmur    history of  . Hyperlipidemia   . Hypertension   . Parkinson's disease (HCC)   . Stroke [facility](HCC)    4-5 yrs ago(left sided weakness) -no residual  . Transfusion  history    age 74- "hysterectomy"    Past Surgical History:  Procedure Laterality Date  . ABDOMINAL HYSTERECTOMY    . COLONOSCOPY WITH PROPOFOL N/A 12/25/2014   Procedure: COLONOSCOPY WITH PROPOFOL;  Surgeon: Charolett BumpersMartin K Johnson, MD;  Location: [facility];  Service: Endoscopy;  Laterality: N/A;  . FRACTURE SURGERY  01/22/12   fx of eye socket from fall-"retained plate"  . HAND SURGERY Left    nerve release -many yrs ago  . HIP ARTHROPLASTY Left 09/16/2014   Procedure: ARTHROPLASTY BIPOLAR HIP;  Surgeon: Sheral Apleyimothy D Murphy, MD;  [facility] OR;  Service: Orthopedics;  Laterality: Left;     reports that she has never smoked. She has never used smokeless tobacco. She reports current alcohol use. She reports that she does not use drugs.  Allergies  Allergen Reactions  . Percodan [Oxycodone-Aspirin] Nausea Only  . Oxycodone Nausea Only    Family History  Problem Relation Age of Onset  . Hyperlipidemia Mother   . Hypertension Mother   . Diabetes Mother   . Heart disease Mother        aortic vavle  replaced  . Hypertension Father   . Hyperlipidemia Father   . Heart disease Father        aortic valve replaced  . Alzheimer's disease Father      Prior to Admission medications   Medication  Sig Start Date End Date Taking? Authorizing Provider  acetaminophen (TYLENOL) 500 MG tablet Take 2 tablets (1,000 mg total) by mouth every 8 (eight) hours as needed. Patient taking differently: Take 1,000 mg by mouth every 8 (eight) hours as needed for moderate pain.  04/02/20  Yes Zigmund Daniel., MD  alendronate (FOSAMAX) 70 MG tablet Take 1 tablet (70 mg total) by mouth once a week. Take with 8 oz of water 30 minutes prior to food/meds. Do not lie down for 30 minutes. 10/13/19  Yes Lassen, Arlo C, PA-C  amLODipine (NORVASC) 5 MG tablet Take 1 tablet (5 mg total) by mouth 2 (two) times daily. 10/13/19  Yes Lassen, Arlo C, PA-C  ARIPiprazole (ABILIFY) 10 MG tablet Take 1 tablet (10 mg total) by  mouth daily. 01/02/20  Yes Cherie Ouch, PA-C  aspirin 81 MG chewable tablet Chew 81 mg by mouth daily.   Yes [provider]  atorvastatin (LIPITOR) 40 MG tablet Take 1 tablet (40 mg total) by mouth daily. 10/13/19  Yes Lassen, Arlo C, PA-C  bisacodyl (FLEET) 10 MG/30ML ENEM Place 10 mg rectally once. If Milk Of Magnesium P.O doesn't work. Give 10mg  of Bisacodyl rectally.   Yes [provider]  buPROPion (WELLBUTRIN XL) 150 MG 24 hr tablet TAKE 3 TABLETS BY MOUTH  DAILY 05/06/20  Yes Hurst, Teresa T, PA-C  carbidopa-levodopa (SINEMET IR) 25-100 MG tablet Take 1 tablet by mouth 3 (three) times daily. 01/22/20  Yes McCue, 01/24/20, NP  Cholecalciferol (VITAMIN D) 50 MCG (2000 UT) CAPS Take 2,000 Units by mouth daily.    Yes [provider]  gabapentin (NEURONTIN) 300 MG capsule Take 1 capsule (300 mg total) by mouth 3 (three) times daily. 10/13/19  Yes Lassen, Arlo C, PA-C  lactose free nutrition (BOOST) LIQD Take 237 mLs by mouth 2 (two) times daily between meals. 04/03/20  Yes [provider]  melatonin 5 MG TABS Take 5 mg by mouth at bedtime.    Yes [provider]  metoprolol tartrate (LOPRESSOR) 25 MG tablet Take 25 mg by mouth 2 (two) times daily.   Yes [provider]  Multiple Vitamin (MULTIVITAMIN) tablet Take 1 tablet by mouth daily.   Yes [provider]  pantoprazole (PROTONIX) 40 MG tablet Take 40 mg by mouth daily.   Yes [provider]  sertraline (ZOLOFT) 100 MG tablet TAKE 2 TABLETS BY MOUTH  DAILY 05/13/20  Yes Hurst, Teresa T, PA-C  tiZANidine (ZANAFLEX) 2 MG tablet Take 1 tablet (2 mg total) by mouth 2 (two) times daily. QAM and QHS 10/13/19  Yes Lassen, Arlo C, PA-C  traZODone (DESYREL) 50 MG tablet TAKE 1 TO 2 TABLETS BY  MOUTH AT BEDTIME AS NEEDED  FOR SLEEP 05/13/20  Yes Hurst, Teresa T, PA-C  lidocaine (LIDODERM) 5 % Place 1 patch onto the skin daily. Remove & Discard patch within 12 hours or as directed by  MD Patient not taking: Reported on 05/19/2020 04/02/20   04/04/20., MD  OVER THE COUNTER MEDICATION 30 mLs daily. Milk Of Magnesium P.O. Patient not taking: Reported on 05/19/2020    [provider]    Physical Exam: Vitals:   05/19/20 1102 05/19/20 1330 05/19/20 1542 05/19/20 1747  BP: 131/69 (!) 123/58 135/83 134/69  Pulse: 94 (!) 102 (!) 102 89  Resp: 16 17 16 14   Temp:   98.4 F (36.9 C) 98 F (36.7 C)  TempSrc:   Oral   SpO2:  96% 95% 96% 96%  Weight:      Height:        Constitutional: NAD, calm, comfortable, thin Eyes: PERRL, lids and conjunctivae normal ENMT: Mucous membranes are moist. Posterior pharynx clear of any exudate or lesions.Normal dentition.  Neck: normal, supple, no masses, no thyromegaly, C- spine collar in place Respiratory: clear to auscultation bilaterally, no wheezing, no crackles. Normal respiratory effort. No accessory muscle use.  Cardiovascular: Regular rate and rhythm, no murmurs / rubs / gallops. No extremity edema. 2+ pedal pulses. No carotid bruits.  Abdomen: no tenderness, no masses palpated. No hepatosplenomegaly. Bowel sounds positive.  Musculoskeletal: no clubbing / cyanosis. No joint deformity upper and lower extremities. Good ROM, no contractures. Normal muscle tone.  Skin: no rashes, lesions, ulcers. No induration Neurologic: CN 2-12 grossly intact. Sensation intact, DTR normal. Strength 5/5 in all 4.  Psychiatric: Normal judgment and insight. Alert and oriented x 3. Normal mood.   Labs on Admission: I have personally reviewed following labs and imaging studies  CBC: Recent Labs  Lab 05/19/20 0119  WBC 6.6  HGB 12.6  HCT 39.4  MCV 89.5  PLT 457*   Basic Metabolic Panel: Recent Labs  Lab 05/19/20 0119  NA 137  K 4.2  CL 104  CO2 18*  GLUCOSE 104*  BUN 20  CREATININE 0.99  CALCIUM 9.0   GFR: Estimated Creatinine Clearance: 38.8 mL/min (by C-G formula based on SCr of 0.99 mg/dL). Liver Function  Tests: Recent Labs  Lab 05/19/20 0119  AST 45*  ALT 48*  ALKPHOS 124  BILITOT 1.1  PROT 7.9  ALBUMIN 4.1   Cardiac Enzymes: Recent Labs  Lab 05/19/20 0119  CKTOTAL 245*   CBG: Recent Labs  Lab 05/19/20 0051 05/19/20 0639  GLUCAP 120* 83   Urine analysis:    Component Value Date/Time   COLORURINE YELLOW 05/19/2020 0405   APPEARANCEUR CLEAR 05/19/2020 0405   LABSPEC >1.030 (H) 05/19/2020 0405   PHURINE 5.5 05/19/2020 0405   GLUCOSEU NEGATIVE 05/19/2020 0405   HGBUR NEGATIVE 05/19/2020 0405   BILIRUBINUR MODERATE (A) 05/19/2020 0405   KETONESUR >80 (A) 05/19/2020 0405   PROTEINUR NEGATIVE 05/19/2020 0405   UROBILINOGEN 0.2 03/11/2011 1828   NITRITE NEGATIVE 05/19/2020 0405   LEUKOCYTESUR TRACE (A) 05/19/2020 0405    Radiological Exams on Admission: CT HEAD WO CONTRAST  Result Date: 05/19/2020 CLINICAL DATA:  Altered mental status. EXAM: CT HEAD WITHOUT CONTRAST TECHNIQUE: Contiguous axial images were obtained from the base of the skull through the vertex without intravenous contrast. COMPARISON:  November 23, 2019 FINDINGS: Brain: There is mild cerebral atrophy with widening of the extra-axial spaces and ventricular dilatation. There are areas of decreased attenuation within the white matter tracts of the supratentorial brain, consistent with microvascular disease changes. Vascular: No hyperdense vessel or unexpected calcification. Skull: Normal. Negative for fracture or focal lesion. Sinuses/Orbits: No acute finding. Other: None. IMPRESSION: 1. Generalized cerebral atrophy. 2. No acute intracranial abnormality. Electronically Signed   By: Aram Candela M.D.   On: 05/19/2020 03:04   DG Chest Port 1 View  Result Date: 05/19/2020 CLINICAL DATA:  Weakness EXAM: PORTABLE CHEST 1 VIEW COMPARISON:  None. FINDINGS: The heart size and mediastinal contours are within normal limits. Both lungs are clear. The visualized skeletal structures are unremarkable. IMPRESSION: No active  disease. Electronically Signed   By: Deatra Robinson M.D.   On: 05/19/2020 02:19    EKG: Independently reviewed.  Sinus tachycardia no ST-T wave changes  Assessment/Plan  Active Problems:   Hip fracture, left (HCC)   History of stroke   Hyperlipidemia   Essential hypertension   Fall   Confusion   Generalized weakness   Confusion/encephalopathy Complete work-up to include MR to rule out occult stroke, CT of her C-spine to ensure fracture is healing well no displacement that might cause falling Check RPR, ammonia level, B12 level, B1 level, TSH, r/o UTI  Elevated CK/rhabdo IV fluid hydration Repeat CK  Generalized weakness PT/OT eval See above  Recent hip fracture Nonsurgical, has been weightbearing PT  History of stroke Left-sided residual weakness Continue statin  Hyperlipidemia Continue atorvastatin  Hypertension Continue amlodipine, Toprol  Fall risk PT, has a walker and cane that she uses at home.  Parkinson-like tremor and gait  Continue Sinemet, gabapentin, Zoloft, trazodone, Abilify  History of anemia Hemoglobin is stable today  GERD Continue Protonix  Osteoporosis Continue Fosamax, calcium plus D  DVT prophylaxis: Lovenox SQ Code Status: DNR confirmed with patient today Family Communication: Patient plus caregiver at bedside, son on the phone Disposition Plan: Pending work-up Consults called: None Admission status: Inpatient   Reva Bores MD [facility]  If 7PM-7AM, please contact night-coverage 05/19/2020, 5:47 PM

## 2020-05-19 NOTE — ED Notes (Signed)
Pt ate about 50% of her lunch. Gave patient chicken with gravy and mashed potatoes. Pt also had a ginger ale, but did not finish it. Pt is sitting up now.

## 2020-05-19 NOTE — ED Notes (Signed)
Pt was fed mac n cheese with a diet ginger ale soda on the side by this Probation officer, Sharyn Lull NT. Pt ate only 50% of her food. Pt only finished her soda. Pt was sitting up in high fowlers while eating. Pt left in high fowlers for her 30 minutes after eating. Cindy RN in room.

## 2020-05-19 NOTE — ED Notes (Signed)
Called [facility] for Dr Lynelle Doctor for consult with Hospitalist spoke to [facility]

## 2020-05-19 NOTE — ED Notes (Signed)
Went into room to feed pt, per [employer] request. Pt stated "I am not hungry at the moment.". Explained to patient that this writer will come back in 25 minutes and sit her up to eat breakfast and help feed her. Pt verbally stated that she understood and was content with that. Pt went back to sleep.

## 2020-05-19 NOTE — ED Notes (Signed)
Taken to CT at this time. 

## 2020-05-19 NOTE — ED Notes (Addendum)
Pt c/o her neck hurting. Re-positioned pt for comfort. States this improved her pain. Pt had a purewick in place draining clear yellow urine. Brief is dry. Warm blankets given.

## 2020-05-19 NOTE — Progress Notes (Signed)
PHARMACIST - PHYSICIAN COMMUNICATION  CONCERNING: P&T Medication Policy Regarding Oral Bisphosphonates  RECOMMENDATION: Your order for alendronate (Fosamax), ibandronate (Boniva), or risedronate (Actonel) has been discontinued at this time.  If the patient's post-hospital medical condition warrants safe use of this class of drugs, please resume the pre-hospital regimen upon discharge.  DESCRIPTION:  Alendronate (Fosamax), ibandronate (Boniva), and risedronate (Actonel) can cause severe esophageal erosions in patients who are unable to remain upright at least 30 minutes after taking this medication.   Since brief interruptions in therapy are thought to have minimal impact on bone mineral density, the Pharmacy & Therapeutics Committee has established that bisphosphonate orders should be routinely discontinued during hospitalization.   To override this safety policy and permit administration of Boniva, Fosamax, or Actonel in the hospital, prescribers must write "DO NOT HOLD" in the comments section when placing the order for this class of medications.  Cindi Carbon, PharmD 05/19/20 6:12 PM

## 2020-05-19 NOTE — ED Provider Notes (Signed)
Pt seen by Dr Blinda Leatherwood.  Patient presented here for evaluation of weakness, in ability to care for herself at home.  Patient was admitted to the hospital at [facility] back in May.  She was discharged on May 25.  At that time she was admitted for a periprosthetic fracture.  Patient was also noted to have a nondisplaced fracture of the anterior arch of C2.  Patient states she went to a rehab facility.  She has been home about a week or 2.  Patient has had multiple falls since being home.  She lives alone.  Patient states she had been able to walk initially but has felt very weak over the last couple of days.    Pt is somewhat confused.  EMS reports recent spine surgery however I reviewed records and discussed with patient, she denies any recent spine surgery.  Last she was in the hospital was the admission in May at [facility].  Labs notable for decreased bicarb, increased CK.  UA abnormal.  Pt is confused about recent events.  Speech is slow.  Pt not able to care for herself, safely ambulate.  Will consult with medical service.  Feel pt requires admission, consider MRI brain and neck to evaluate her cervical spine injury and rule out occult cva.  Will also need pt ot, social work, case management.   Linwood Dibbles, MD 05/19/20 [phone]

## 2020-05-19 NOTE — ED Triage Notes (Signed)
Pt arrived via [facility]. They responded for an altered LOC. They noted pt had a BGL of 58. They had pt take drink her "Glucerna" PO and reported BGL increased to 126. Pt also having recent difficulties with ADLs. Pt was discharged home from rehab on 7/8 after having neck surgery. Pt lives alone. EMS report pt has had multiple falls since she had been home. Upon talking with pt she has been unable to do grocery shopping and pt is unsure what she does for her own dinner each night. Pt has no family in the area.

## 2020-05-19 NOTE — TOC Initial Note (Signed)
Transition of Care [facility]) - Initial/Assessment Note    Patient Details  Name: Dana [name] MRN: [id number] Date of Birth: 18-Jul-1946  [facility] CM/SW Contact:    Lockie Pares, RN Phone Number: 05/19/2020, 9:11 AM  Clinical Narrative:                 Called to consult for home health needs. Patient is to be admitted for workup at this time. Will defer to [facility] for future needs, will likely need long term placement given that hte patient is unable to care for self with multiple restrictive orthopedic and spinal issues. . Plan for placement. Recommend PT and OT eval and recommendations.         Patient Goals and CMS Choice        Expected Discharge Plan and Services    Long term care.                                            Prior Living Arrangements/Services                       Activities of Daily Living      Permission Sought/Granted                  Emotional Assessment              Admission diagnosis:  Encephalopathy [G93.40] Patient Active Problem List   Diagnosis Date Noted  . Encephalopathy 05/19/2020  . Peri-prosthetic fracture around prosthetic hip   . Fall 03/27/2020  . Metabolic acidosis 10/04/2019  . Acute encephalopathy 10/04/2019  . Hyponatremia 10/04/2019  . Hypokalemia 10/04/2019  . Hypernatremia 10/04/2019  . Polyneuropathy 10/04/2019  . Insomnia 09/07/2018  . Major depressive disorder 07/25/2018  . GAD (generalized anxiety disorder) 07/25/2018  . Hip fracture, left (HCC) 09/15/2014  . Hip fracture requiring operative repair (HCC) 09/15/2014  . Hip fracture (HCC) 09/15/2014  . Chronic back pain 09/15/2014  . History of stroke   . Hypertension   . Hyperlipidemia   . Essential hypertension   . Spastic hemiplegia affecting nondominant side (HCC) 01/12/2012   PCP:  Juluis Rainier, MD Pharmacy:   [id number] - [place], [place] - [address] [place] [address] Phone: [phone] Fax: [phone]  [facility] [place], [place] - [address] [place], [address] [place], [address] [place] [address] Phone: [phone] Fax: [phone]  [facility] [address] [place] - [address] [place] [address] Phone: [phone] Fax: [phone]     Social Determinants of Health (SDOH) Interventions    Readmission Risk Interventions No flowsheet data found.

## 2020-05-19 NOTE — ED Notes (Signed)
Pt needed to use the bathroom. Both this writer, Marcelino Duster NT, and Gaspar Garbe EMT helped put the pt on a bedpan.

## 2020-05-19 NOTE — ED Notes (Signed)
This writer,Horrace Hanak NT, went to go and check pt. Pt has been taking a nap. Asked pt if she would like to eat some lunch, pt stated that they would want to wait. This writer told pt that they would be back at 1230 to feed her lunch. Pt stated that that would be fine. Cindy RN notified.

## 2020-05-19 NOTE — ED Notes (Signed)
Pt friend Sonny Masters and son Mal Amabile updated with room number at [facility]

## 2020-05-19 NOTE — ED Notes (Addendum)
Pt son Samarra Ridgely updated with plan and spoke with pt. [phone]

## 2020-05-19 NOTE — ED Provider Notes (Signed)
[facility] EMERGENCY DEPARTMENT Provider Note   CSN: [id number] Arrival date & time: 05/19/20  0047     History Chief Complaint  Patient presents with  . Hypoglycemia    Dana [name] is a 74 y.o. female.  Patient called EMS tonight because she has not been able to perform her activities of daily living.  Patient underwent recent cervical spine surgery.  She was at a nursing home for rehab and was discharged 2 days ago.  She reports that she has been lying on her couch, unable to get up since then.  She cannot recall the last time she ate.  She reports that there is apparently a visiting nurse that comes but she does not know how often.  EMS report that the patient's blood sugar was low at 58.  She was given Glucerna and repeat had improved.  Patient denies chest pain, shortness of breath, abdominal pain.         Past Medical History:  Diagnosis Date  . Anemia    none since hysterectomy  . Arthritis    LlTHA 11'15(had a fall)-"remains tender"  . Depression   . Heart murmur    history of  . Hyperlipidemia   . Hypertension   . Stroke [facility])    4-5 yrs ago(left sided weakness) -no residual  . Transfusion history    age 49- "hysterectomy"    Patient Active Problem List   Diagnosis Date Noted  . Peri-prosthetic fracture around prosthetic hip   . Fall 03/27/2020  . Metabolic acidosis 10/04/2019  . Acute encephalopathy 10/04/2019  . Hyponatremia 10/04/2019  . Hypokalemia 10/04/2019  . Hypernatremia 10/04/2019  . Polyneuropathy 10/04/2019  . Insomnia 09/07/2018  . Major depressive disorder 07/25/2018  . GAD (generalized anxiety disorder) 07/25/2018  . Hip fracture, left (HCC) 09/15/2014  . Hip fracture requiring operative repair (HCC) 09/15/2014  . Hip fracture (HCC) 09/15/2014  . Chronic back pain 09/15/2014  . History of stroke   . Hypertension   . Hyperlipidemia   . Essential hypertension   . Spastic hemiplegia affecting nondominant side (HCC)  01/12/2012    Past Surgical History:  Procedure Laterality Date  . ABDOMINAL HYSTERECTOMY    . COLONOSCOPY WITH PROPOFOL N/A 12/25/2014   Procedure: COLONOSCOPY WITH PROPOFOL;  Surgeon: Charolett Bumpers, MD;  Location: [facility];  Service: Endoscopy;  Laterality: N/A;  . FRACTURE SURGERY  01/22/12   fx of eye socket from fall-"retained plate"  . HAND SURGERY Left    nerve release -many yrs ago  . HIP ARTHROPLASTY Left 09/16/2014   Procedure: ARTHROPLASTY BIPOLAR HIP;  Surgeon: Sheral Apley, MD;  Location: [facility] OR;  Service: Orthopedics;  Laterality: Left;     OB History   No obstetric history on file.     Family History  Problem Relation Age of Onset  . Hyperlipidemia Mother   . Hypertension Mother   . Diabetes Mother   . Heart disease Mother        aortic vavle  replaced  . Hypertension Father   . Hyperlipidemia Father   . Heart disease Father        aortic valve replaced  . Alzheimer's disease Father     Social History   Tobacco Use  . Smoking status: Never Smoker  . Smokeless tobacco: Never Used  Vaping Use  . Vaping Use: Never used  Substance Use Topics  . Alcohol use: Yes    Comment: occasional beer  . Drug use: Never  Home Medications Prior to Admission medications   Medication Sig Start Date End Date Taking? Authorizing Provider  acetaminophen (TYLENOL) 500 MG tablet Take 2 tablets (1,000 mg total) by mouth every 8 (eight) hours as needed. 04/02/20   Zigmund Daniel., MD  alendronate (FOSAMAX) 70 MG tablet Take 1 tablet (70 mg total) by mouth once a week. Take with 8 oz of water 30 minutes prior to food/meds. Do not lie down for 30 minutes. 10/13/19   Edmon Crape C, PA-C  amLODipine (NORVASC) 5 MG tablet Take 1 tablet (5 mg total) by mouth 2 (two) times daily. 10/13/19   Edmon Crape C, PA-C  ARIPiprazole (ABILIFY) 10 MG tablet Take 1 tablet (10 mg total) by mouth daily. 01/02/20   Cherie Ouch, PA-C  aspirin 81 MG chewable tablet Chew 81 mg by  mouth daily.    [provider]  atorvastatin (LIPITOR) 40 MG tablet Take 1 tablet (40 mg total) by mouth daily. 10/13/19   Edmon Crape C, PA-C  bisacodyl (FLEET) 10 MG/30ML ENEM Place 10 mg rectally once. If Milk Of Magnesium P.O doesn't work. Give 10mg  of Bisacodyl rectally.    [provider]  buPROPion (WELLBUTRIN XL) 150 MG 24 hr tablet TAKE 3 TABLETS BY MOUTH  DAILY 05/06/20   05/08/20 T, PA-C  carbidopa-levodopa (SINEMET IR) 25-100 MG tablet Take 1 tablet by mouth 3 (three) times daily. 01/22/20   01/24/20, NP  Cholecalciferol (VITAMIN D) 50 MCG (2000 UT) CAPS Take 2,000 Units by mouth daily.     [provider]  gabapentin (NEURONTIN) 300 MG capsule Take 1 capsule (300 mg total) by mouth 3 (three) times daily. 10/13/19   14/4/20 C, PA-C  lactose free nutrition (BOOST) LIQD Take 237 mLs by mouth 2 (two) times daily between meals. 04/03/20   [provider]  lidocaine (LIDODERM) 5 % Place 1 patch onto the skin daily. Remove & Discard patch within 12 hours or as directed by MD 04/02/20   04/04/20., MD  melatonin 5 MG TABS Take 5 mg by mouth.    [provider]  metoprolol tartrate (LOPRESSOR) 25 MG tablet Take 25 mg by mouth 2 (two) times daily.    [provider]  Multiple Vitamin (MULTIVITAMIN) tablet Take 1 tablet by mouth daily.    [provider]  OVER THE COUNTER MEDICATION 30 mLs daily. Milk Of Magnesium P.O.    [provider]  pantoprazole (PROTONIX) 40 MG tablet Take 40 mg by mouth daily.    [provider]  sertraline (ZOLOFT) 100 MG tablet TAKE 2 TABLETS BY MOUTH  DAILY 05/13/20   07/14/20 T, PA-C  tiZANidine (ZANAFLEX) 2 MG tablet Take 1 tablet (2 mg total) by mouth 2 (two) times daily. QAM and QHS 10/13/19   Lassen, Arlo C, PA-C  traZODone (DESYREL) 50 MG tablet TAKE 1 TO 2 TABLETS BY  MOUTH AT BEDTIME AS NEEDED  FOR SLEEP 05/13/20   07/14/20 T, PA-C    Allergies      Percodan [oxycodone-aspirin] and Oxycodone  Review of Systems   Review of Systems  Constitutional: Positive for fatigue.  Respiratory: Negative for shortness of breath.   Cardiovascular: Negative for chest pain.  All other systems reviewed and are negative.   Physical Exam Updated Vital Signs BP 130/63 (BP Location: Right Arm)   Pulse (!) 101   Temp 98.7 F (37.1 C) (Rectal)   Resp 19   Ht   (1.575 m)   Wt 52.2 kg   SpO2 97%   BMI 21.03 kg/m   Physical Exam Vitals and nursing note reviewed.  Constitutional:      General: She is not in acute distress.    Appearance: Normal appearance. She is well-developed.  HENT:     Head: Normocephalic and atraumatic.     Right Ear: Hearing normal.     Left Ear: Hearing normal.     Nose: Nose normal.  Eyes:     Conjunctiva/sclera: Conjunctivae normal.     Pupils: Pupils are equal, round, and reactive to light.  Cardiovascular:     Rate and Rhythm: Regular rhythm.     Heart sounds: S1 normal and S2 normal. No murmur heard.  No friction rub. No gallop.   Pulmonary:     Effort: Pulmonary effort is normal. No respiratory distress.     Breath sounds: Normal breath sounds.  Chest:     Chest wall: No tenderness.  Abdominal:     General: Bowel sounds are normal.     Palpations: Abdomen is soft.     Tenderness: There is no abdominal tenderness. There is no guarding or rebound. Negative signs include Murphy's sign and McBurney's sign.     Hernia: No hernia is present.  Musculoskeletal:        General: Normal range of motion.     Cervical back: Normal range of motion and neck supple.  Skin:    General: Skin is warm and dry.     Findings: No rash.  Neurological:     Mental Status: She is alert. She is confused.     GCS: GCS eye subscore is 4. GCS verbal subscore is 4. GCS motor subscore is 6.     Cranial Nerves: No cranial nerve deficit.     Sensory: No sensory deficit.     Coordination: Coordination normal.     Comments:  Disoriented to time  Psychiatric:        Speech: Speech normal.        Behavior: Behavior normal.        Thought Content: Thought content normal.     ED Results / Procedures / Treatments   Labs (all labs ordered are listed, but only abnormal results are displayed) Labs Reviewed  COMPREHENSIVE METABOLIC PANEL - Abnormal; Notable for the following components:      Result Value   CO2 18 (*)    Glucose, Bld 104 (*)    AST 45 (*)    ALT 48 (*)    GFR calc non Af Amer 56 (*)    All other components within normal limits  CBC - Abnormal; Notable for the following components:   Platelets 457 (*)    All other components within normal limits  URINALYSIS, ROUTINE W REFLEX MICROSCOPIC - Abnormal; Notable for the following components:   Specific Gravity, Urine >1.030 (*)    Bilirubin Urine MODERATE (*)    Ketones, ur >80 (*)    Leukocytes,Ua TRACE (*)    All other components within normal limits  CK - Abnormal; Notable for the following components:   Total CK 245 (*)    All other components within normal limits  URINALYSIS, MICROSCOPIC (REFLEX) - Abnormal; Notable for the following components:   Bacteria, UA FEW (*)    Non Squamous Epithelial PRESENT (*)    All other components within normal limits  CBG MONITORING, ED - Abnormal; Notable for the following components:   Glucose-Capillary  120 (*)    All other components within normal limits    EKG EKG Interpretation  Date/Time:  Sunday May 19 2020 01:10:09 EDT Ventricular Rate:  101 PR Interval:    QRS Duration: 96 QT Interval:  349 QTC Calculation: 453 R Axis:   10 Text Interpretation: Sinus tachycardia Baseline wander in lead(s) V6 Confirmed by Gilda Crease [phone]) on 05/19/2020 1:12:29 AM   Radiology CT HEAD WO CONTRAST  Result Date: 05/19/2020 CLINICAL DATA:  Altered mental status. EXAM: CT HEAD WITHOUT CONTRAST TECHNIQUE: Contiguous axial images were obtained from the base of the skull through the vertex  without intravenous contrast. COMPARISON:  November 23, 2019 FINDINGS: Brain: There is mild cerebral atrophy with widening of the extra-axial spaces and ventricular dilatation. There are areas of decreased attenuation within the white matter tracts of the supratentorial brain, consistent with microvascular disease changes. Vascular: No hyperdense vessel or unexpected calcification. Skull: Normal. Negative for fracture or focal lesion. Sinuses/Orbits: No acute finding. Other: None. IMPRESSION: 1. Generalized cerebral atrophy. 2. No acute intracranial abnormality. Electronically Signed   By: Aram Candela M.D.   On: 05/19/2020 03:04   DG Chest Port 1 View  Result Date: 05/19/2020 CLINICAL DATA:  Weakness EXAM: PORTABLE CHEST 1 VIEW COMPARISON:  None. FINDINGS: The heart size and mediastinal contours are within normal limits. Both lungs are clear. The visualized skeletal structures are unremarkable. IMPRESSION: No active disease. Electronically Signed   By: Deatra Robinson M.D.   On: 05/19/2020 02:19    Procedures Procedures (including critical care time)  Medications Ordered in ED Medications - No data to display  ED Course  I have reviewed the triage vital signs and the nursing notes.  Pertinent labs & imaging results that were available during my care of the patient were reviewed by me and considered in my medical decision making (see chart for details).    MDM Rules/Calculators/A&P                          Patient brought to the emergency department from home.  She seems slightly confused and it is unclear if this is her normal baseline.  Patient lives alone, nearest family is in [place].  She was discharged from rehab after cervical spine surgery 2 days ago and has not been doing well.  She cannot perform her activities of daily living.  She has not been eating and reports that she has essentially been lying on the couch that she was placed on when she came home from the nursing  home.  Her examination is unremarkable other than her slight confusion.  Work-up does not reveal any specific process.  She will require social work intervention, as she does not meet any criteria for repeat admission at this time.  Final Clinical Impression(s) / ED Diagnoses Final diagnoses:  Generalized weakness    Rx / DC Orders ED Discharge Orders    None       Bulmaro Feagans, Canary Brim, MD 05/19/20 [phone]

## 2020-05-19 NOTE — ED Notes (Signed)
Pt friend Jeanine Luz updated with plan with the pts permission. She states she has the son's phone number. Her number is [phone]

## 2020-05-19 NOTE — ED Notes (Signed)
ED Provider at bedside. 

## 2020-05-20 ENCOUNTER — Encounter (HOSPITAL_COMMUNITY): Payer: Self-pay | Admitting: Family Medicine

## 2020-05-20 ENCOUNTER — Inpatient Hospital Stay (HOSPITAL_COMMUNITY): Payer: Medicare Other

## 2020-05-20 DIAGNOSIS — S72002D Fracture of unspecified part of neck of left femur, subsequent encounter for closed fracture with routine healing: Secondary | ICD-10-CM

## 2020-05-20 DIAGNOSIS — I10 Essential (primary) hypertension: Secondary | ICD-10-CM

## 2020-05-20 DIAGNOSIS — W19XXXD Unspecified fall, subsequent encounter: Secondary | ICD-10-CM

## 2020-05-20 DIAGNOSIS — G9341 Metabolic encephalopathy: Secondary | ICD-10-CM | POA: Diagnosis present

## 2020-05-20 DIAGNOSIS — E785 Hyperlipidemia, unspecified: Secondary | ICD-10-CM

## 2020-05-20 DIAGNOSIS — Z8673 Personal history of transient ischemic attack (TIA), and cerebral infarction without residual deficits: Secondary | ICD-10-CM

## 2020-05-20 DIAGNOSIS — R531 Weakness: Secondary | ICD-10-CM

## 2020-05-20 LAB — BASIC METABOLIC PANEL
Anion gap: 9 (ref 5–15)
BUN: 16 mg/dL (ref 8–23)
CO2: 24 mmol/L (ref 22–32)
Calcium: 8.9 mg/dL (ref 8.9–10.3)
Chloride: 105 mmol/L (ref 98–111)
Creatinine, Ser: 0.75 mg/dL (ref 0.44–1.00)
GFR calc Af Amer: >60 mL/min (ref >60)
GFR calc non Af Amer: >60 mL/min (ref >60)
Glucose, Bld: 106 mg/dL — ABNORMAL HIGH (ref 70–99)
Potassium: 4 mmol/L (ref 3.5–5.1)
Sodium: 138 mmol/L (ref 135–145)

## 2020-05-20 LAB — CBC
HCT: 34.9 % — ABNORMAL LOW (ref 36.0–46.0)
Hemoglobin: 11.3 g/dL — ABNORMAL LOW (ref 12.0–15.0)
MCH: 28.5 pg (ref 26.0–34.0)
MCHC: 32.4 g/dL (ref 30.0–36.0)
MCV: 88.1 fL (ref 80.0–100.0)
Platelets: 419 10*3/uL — ABNORMAL HIGH (ref 150–400)
RBC: 3.96 MIL/uL (ref 3.87–5.11)
RDW: 14.3 % (ref 11.5–15.5)
WBC: 5.4 10*3/uL (ref 4.0–10.5)
nRBC: 0 % (ref 0.0–0.2)

## 2020-05-20 LAB — URINE CULTURE: Culture: NO GROWTH

## 2020-05-20 LAB — CK: Total CK: 88 U/L (ref 38–234)

## 2020-05-20 LAB — AMMONIA: Ammonia: 27 umol/L (ref 9–35)

## 2020-05-20 LAB — VITAMIN B12: Vitamin B-12: 597 pg/mL (ref 180–914)

## 2020-05-20 LAB — TSH: TSH: 0.878 u[IU]/mL (ref 0.350–4.500)

## 2020-05-20 LAB — RPR: RPR Ser Ql: NONREACTIVE

## 2020-05-20 MED ORDER — METOPROLOL TARTRATE 25 MG PO TABS
25.0000 mg | ORAL_TABLET | Freq: Two times a day (BID) | ORAL | Status: DC
  Administered 2020-05-21 – 2020-05-23 (×4): 25 mg via ORAL
  Filled 2020-05-20 (×5): qty 1

## 2020-05-20 MED ORDER — GADOBUTROL 1 MMOL/ML IV SOLN
5.0000 mL | Freq: Once | INTRAVENOUS | Status: AC | PRN
  Administered 2020-05-20: 5 mL via INTRAVENOUS

## 2020-05-20 NOTE — Progress Notes (Signed)
Pt stable on arrival to floor with no needs. Pt with neck collar in place with multiple mepilex foam dressing under the collar to protect pt skin. Pt denied pain and assessment was performed and documented. Rn will continue to monitor.

## 2020-05-20 NOTE — Progress Notes (Signed)
Pt son Mal Amabile called to updated on pt condition and to ensure he knew his mom was at [employer]. No complications with this communication.

## 2020-05-20 NOTE — Plan of Care (Signed)
  Problem: Clinical Measurements: Goal: Ability to maintain clinical measurements within normal limits will improve Outcome: Progressing   Problem: Nutrition: Goal: Adequate nutrition will be maintained Outcome: Progressing   Problem: Coping: Goal: Level of anxiety will decrease Outcome: Progressing   Problem: Elimination: Goal: Will not experience complications related to urinary retention Outcome: Progressing   Problem: Pain Managment: Goal: General experience of comfort will improve Outcome: Progressing   

## 2020-05-20 NOTE — Plan of Care (Signed)

## 2020-05-20 NOTE — Evaluation (Signed)
Physical Therapy Evaluation Patient Details Name: Dana [name] MRN: [id number] DOB: 06/10/1946 Today's Date: 05/20/2020   History of Present Illness  74 yo female admitted with confusion, falls at home, inability to care for herself. Recent hx of C1 spinal fx and L greater trochanter fx 03/2020-both with nonop management. Hx of CVA with L side residual weakness, L THA, anemia, OA, neuropathy, chronic pain    Clinical Impression  On eval, pt required Min assist for mobility. She walked ~75 feet with a RW. Pt denied pain. She tolerated activity well. Discussed d/c plan-pt stated she is arranging for in home assistance but it will not be 24/7. She stated she did not feel she was ready to d/c home from SNF when they released her. Unsure of benefits that remain available to pt-recommend CSW/CM consult. She has a history of multiple falls. Pt does not appear safely able to manage at home alone. Will continue to follow and progress activity as tolerated.     Follow Up Recommendations SNF vs Home health PT;Supervision/Assistance - 24 hour (depending on progress and options available-recent d/c from SNF rehab)    Equipment Recommendations  None recommended by PT    Recommendations for Other Services       Precautions / Restrictions Precautions Precautions: Fall Required Braces or Orthoses: Cervical Brace Cervical Brace: Hard collar;At all times ("for 3 months" per pt) Restrictions Weight Bearing Restrictions: No LLE Weight Bearing: Weight bearing as tolerated      Mobility  Bed Mobility Overal bed mobility: Needs Assistance Bed Mobility: Supine to Sit;Sit to Supine Rolling: Supervision   Supine to sit: Supervision;HOB elevated Sit to supine: HOB elevated   General bed mobility comments: Increased time.  Transfers Overall transfer level: Needs assistance Equipment used: Rolling walker (2 wheeled) Transfers: Sit to/from Stand Sit to Stand: Min guard         General transfer  comment: Min guard for safety. VCs hand placement.  Ambulation/Gait Ambulation/Gait assistance: Min assist Gait Distance (Feet): 75 Feet Assistive device: Rolling walker (2 wheeled) Gait Pattern/deviations: Step-through pattern;Decreased stride length     General Gait Details: Assist to steady throughout distance. Pt tolerated distance well.  Stairs            Wheelchair Mobility    Modified Rankin (Stroke Patients Only)       Balance Overall balance assessment: Needs assistance;History of Falls         Standing balance support: Bilateral upper extremity supported Standing balance-Leahy Scale: Poor                               Pertinent Vitals/Pain Pain Assessment: No/denies pain    Home Living Family/patient expects to be discharged to:: Unsure Living Arrangements: Alone   Type of Home: House Home Access: Stairs to enter   [employer] of Steps: 1 Home Layout: One level Home Equipment: Walker - 2 wheels;Cane - single point;Bedside commode;Walker - 4 wheels      Prior Function Level of Independence: Independent with assistive device(s)               Hand Dominance        Extremity/Trunk Assessment   Upper Extremity Assessment Upper Extremity Assessment: Defer to OT evaluation    Lower Extremity Assessment Lower Extremity Assessment: Generalized weakness    Cervical / Trunk Assessment Cervical / Trunk Assessment: Kyphotic  Communication   Communication: No difficulties  Cognition Arousal/Alertness: Awake/alert Behavior  During Therapy: WFL for tasks assessed/performed Overall Cognitive Status: Within Functional Limits for tasks assessed                                        General Comments      Exercises     Assessment/Plan    PT Assessment Patient needs continued PT services  PT Problem List Decreased strength;Decreased mobility;Decreased activity tolerance;Decreased balance;Decreased  knowledge of use of DME       PT Treatment Interventions DME instruction;Gait training;Therapeutic activities;Therapeutic exercise;Patient/family education;Balance training;Functional mobility training    PT Goals (Current goals can be found in the Care Plan section)  Acute Rehab PT Goals Patient Stated Goal: to regain independence PT Goal Formulation: With patient Time For Goal Achievement: 06/03/20 Potential to Achieve Goals: Fair    Frequency Min 3X/week   Barriers to discharge Decreased caregiver support      Co-evaluation               AM-PAC PT "6 Clicks" Mobility  Outcome Measure Help needed turning from your back to your side while in a flat bed without using bedrails?: A Little Help needed moving from lying on your back to sitting on the side of a flat bed without using bedrails?: A Little Help needed moving to and from a bed to a chair (including a wheelchair)?: A Little Help needed standing up from a chair using your arms (e.g., wheelchair or bedside chair)?: A Little Help needed to walk in hospital room?: A Little Help needed climbing 3-5 steps with a railing? : A Little 6 Click Score: 18    End of Session Equipment Utilized During Treatment: Gait belt Activity Tolerance: Patient tolerated treatment well Patient left: in bed;with call bell/phone within reach;with bed alarm set   PT Visit Diagnosis: Muscle weakness (generalized) (M62.81);Difficulty in walking, not elsewhere classified (R26.2);History of falling (Z91.81)    Time: 2355-7322 PT Time Calculation (min) (ACUTE ONLY): 13 min   Charges:   PT Evaluation $PT Eval Low Complexity: 1 Low             Faye Ramsay, PT Acute Rehabilitation  Office: [phone] Pager: [phone]

## 2020-05-20 NOTE — Evaluation (Signed)
Occupational Therapy Evaluation Patient Details Name: Dana [name] MRN: [id number] DOB: Jun 15, 1946 Today's Date: 05/20/2020    History of Present Illness 74 yo female admitted with confusion, falls at home, inability to care for herself. Recent hx of C1 spinal fx and L greater trochanter fx 03/2020-both with nonop management. Hx of CVA with L side residual weakness, L THA, anemia, OA, neuropathy, chronic pain   Clinical Impression   Pt admitted with the abvoe. Pt currently with functional limitations due to the deficits listed below (see OT Problem List).  Pt will benefit from skilled OT to increase their safety and independence with ADL and functional mobility for ADL to facilitate discharge to venue listed below.   Pts collar very soiled- RN aware and arranging for a new one  Pt has not a at home- will need SNF or ALF.     Follow Up Recommendations  SNF    Equipment Recommendations   (Defer to next level of care.)    Recommendations for Other Services       Precautions / Restrictions Precautions Precautions: Fall Required Braces or Orthoses: Cervical Brace Cervical Brace: Hard collar;At all times ("for 3 months" per pt) Restrictions Weight Bearing Restrictions: No LLE Weight Bearing: Weight bearing as tolerated      Mobility Bed Mobility Overal bed mobility: Needs Assistance Bed Mobility: Supine to Sit;Sit to Supine Rolling: Supervision   Supine to sit: Supervision;HOB elevated Sit to supine: HOB elevated   General bed mobility comments: Increased time.  Transfers Overall transfer level: Needs assistance Equipment used: Rolling walker (2 wheeled) Transfers: Sit to/from [employer] Sit to Stand: Min assist        Lateral/Scoot Transfers: Min assist General transfer comment: Min guard for safety. VCs hand placement.    Balance Overall balance assessment: Needs assistance;History of Falls         Standing balance support: Bilateral upper  extremity supported Standing balance-Leahy Scale: Poor                             ADL either performed or assessed with clinical judgement   ADL Overall ADL's : Needs assistance/impaired Eating/Feeding: Set up;Sitting   Grooming: Set up;Sitting   Upper Body Bathing: Bed level;Minimal assistance   Lower Body Bathing: +2 for physical assistance;Sit to/from stand;Maximal assistance;Cueing for safety   Upper Body Dressing : Minimal assistance;Bed level   Lower Body Dressing: Sit to/from stand;Cueing for safety;Maximal assistance   Toilet Transfer: BSC;RW;Stand-pivot;Moderate assistance   Toileting- Clothing Manipulation and Hygiene: Sit to/from stand;Maximal assistance     Tub/Shower Transfer Details (indicate cue type and reason): unable at this time. Functional mobility during ADLs: +2 for physical assistance;Rolling walker;Cueing for safety       Vision Baseline Vision/History: Wears glasses              Pertinent Vitals/Pain Pain Assessment: No/denies pain        Extremity/Trunk Assessment Upper Extremity Assessment Upper Extremity Assessment: Generalized weakness   Lower Extremity Assessment Lower Extremity Assessment: Generalized weakness   Cervical / Trunk Assessment Cervical / Trunk Assessment: Kyphotic   Communication Communication Communication: No difficulties   Cognition Arousal/Alertness: Awake/alert Behavior During Therapy: WFL for tasks assessed/performed Overall Cognitive Status: Within Functional Limits for tasks assessed  Home Living Family/patient expects to be discharged to:: Skilled nursing facility Living Arrangements: Alone   Type of Home: House Home Access: Stairs to enter Entrance Stairs-Number of Steps: 1   Home Layout: One level               Home Equipment: Walker - 2 wheels;Cane - single point;Bedside commode;Walker - 4 wheels           Prior Functioning/Environment Level of Independence: Independent with assistive device(s)                 OT Problem List: Decreased strength;Decreased activity tolerance;Impaired balance (sitting and/or standing);Decreased safety awareness;Decreased knowledge of precautions      OT Treatment/Interventions: Self-care/ADL training;Therapeutic exercise;Therapeutic activities;DME and/or AE instruction;Balance training;Patient/family education    OT Goals(Current goals can be found in the care plan section) Acute Rehab OT Goals Patient Stated Goal: to regain independence OT Goal Formulation: With patient Time For Goal Achievement: 06/03/20  OT Frequency: Min 2X/week              AM-PAC OT "6 Clicks" Daily Activity     Outcome Measure Help from another person eating meals?: A Little Help from another person taking care of personal grooming?: A Little Help from another person toileting, which includes using toliet, bedpan, or urinal?: A Lot Help from another person bathing (including washing, rinsing, drying)?: A Lot Help from another person to put on and taking off regular upper body clothing?: A Little Help from another person to put on and taking off regular lower body clothing?: A Lot 6 Click Score: 15   End of Session Equipment Utilized During Treatment: Gait belt;Rolling walker Nurse Communication: Mobility status  Activity Tolerance: Patient tolerated treatment well Patient left: with call bell/phone within reach;in chair;with nursing/sitter in room  OT Visit Diagnosis: Unsteadiness on feet (R26.81);Other abnormalities of gait and mobility (R26.89);Repeated falls (R29.6);Muscle weakness (generalized) (M62.81)                Time: 1941-7408 OT Time Calculation (min): 24 min Charges:  OT General Charges $OT Visit: 1 Visit OT Evaluation $OT Eval Moderate Complexity: 1 Mod OT Treatments $Self Care/Home Management : 8-22 mins  Lise Auer, OT Acute Rehabilitation  Services [phone] Office- [phone], Karin Golden D 05/20/2020, 6:05 PM

## 2020-05-20 NOTE — Progress Notes (Signed)
Patient bath given and linens changed, bed mattress sanitized, purewick equipment changed. Patient received new gown. Paged ortho tech to receive a new collar pad r/t dried vomit in collar. Patient says that "I feel much better".

## 2020-05-20 NOTE — Progress Notes (Signed)
PROGRESS NOTE    Dana [name]  DJM:[id number] DOB: [birth year] DOA: 05/19/2020 PCP: Juluis Rainier, MD   Chief Complaint  Patient presents with  . Hypoglycemia    Brief Narrative:  HPI per Dr. Joycelyn Man Schickling is a 74 y.o. female with medical history significant of hypertension, Parkinson's like tremor, hyperlipidemia, depression, CVA with residual left-sided weakness, anemia, osteoporosis, who was hospitalized in May for [facility] for C-spine fracture after falling.  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  She did not require surgery for hip fracture but went to inpatient rehab where she was getting physical therapy.  She has been home from the SNF since last week and apparently has had a lot of trouble with her ADLs since then.  It is unclear that she is able to get up, feed herself.  She is incontinent of urine at baseline.  A nurse from the nursing facility has been going by her house and has arranged for home PT who came for the first time on 7/9.  When EMS found her there was a question of whether or not the patient had moved or eaten in the last 24 hours.  ED Course: In the ED she was noted to have an elevated CK at 245 other labs appeared normal.  CT of the head was negative.  She did seem somewhat confused according to the EDP.  We are asked to admit for completion of work-up, including MR, CT of the spine, IV fluids for possible rhabdo, PT/OT eval and decision around placement.   Assessment & Plan:   Principal Problem:   Acute metabolic encephalopathy Active Problems:   Hip fracture, left (HCC)   History of stroke   Hyperlipidemia   Essential hypertension   Fall   Confusion   Generalized weakness  1 acute metabolic encephalopathy/confusion Questionable etiology.  Likely secondary to dehydration.  Patient being hydrated aggressively with IV fluids with clinical improvement.  CT head with generalized cerebral atrophy, no acute  intracranial abnormality.  CT C-spine and MRI brain pending.  Chest x-ray negative for any acute infiltrate.  Urinalysis with trace leukocytes, nitrite negative, 6-10 WBCs.  Urine cultures negative.  Ammonia level of 27.  Vitamin B12 597.  TSH within normal limits at 0.878.  Continue IV fluids.  Supportive care.  2.  Elevated CK Improved with hydration.  Follow.  3.  Dehydration IV fluids.  4.  Generalized weakness PT/OT.  5.  Recent hip fracture Nonsurgical.  Patient noted to be weightbearing.  PT.  Will likely need SNF.  6.  History of CVA, other nonhemorrhagic With residual left-sided weakness.  PT/OT.  Continue statin.  7.  Hyperlipidemia Statin.  8.  Hypertension Blood pressure borderline.  Discontinue Norvasc.  We will hold Lopressor today and resume tomorrow if blood pressure able to tolerate.  Continue IV fluids.  9.  Gastroesophageal reflux disease PPI.  10.  Parkinsonian-like tremor and gait Continue Sinemet, gabapentin, Zoloft, trazodone, Abilify.  PT/OT.  11.  Fall risk Patient noted to have a walking cane that she uses at home.  PT/OT.  12.  Osteoporosis Continue Fosamax and calcium plus D.  13.  History of anemia Patient with no overt bleeding.  H&H stable.  Follow.     DVT prophylaxis: Lovenox Code Status: DNR Family Communication: Updated patient.  No family at bedside. Disposition:   Status is: Inpatient    Dispo: The patient is from: Home  Anticipated d/c is to: SNF              Anticipated d/c date is: Likely SNF when medically stable hopefully in 1 to 2 days.              Patient currently on IV fluids, awaiting MRI brain for further evaluation, PT OT eval pending.       Consultants:   None  Procedures:   CT head/C-spine 05/19/2020  Chest x-ray 05/19/2020  MRI brain pending  Antimicrobials:  None   Subjective: Patient sitting up in chair.  Denies chest pain.  No shortness of breath.  Feeling better.  Alert and  oriented to self and place.  Thinks is 2020.  Unsure of what month it is.  States she is feeling much better than she did on admission.  In agreement for SNF.  Objective: Vitals:   05/19/20 1747 05/19/20 2113 05/20/20 0116 05/20/20 0506  BP: 134/69 115/66 106/64 (!) 102/53  Pulse: 89 91 78 73  Resp: 14 17 17 16   Temp: 98 F (36.7 C) 98.1 F (36.7 C) 98.3 F (36.8 C) 97.9 F (36.6 C)  TempSrc:      SpO2: 96% 96% 95% 95%  Weight:      Height:        Intake/Output Summary (Last 24 hours) at 05/20/2020 2021 Last data filed at 05/20/2020 1800 Gross per 24 hour  Intake 2379.31 ml  Output 1150 ml  Net 1229.31 ml   Filed Weights   05/19/20 0059  Weight: 52.2 kg    Examination:  General exam: Appears calm and comfortable.  Aspen collar.  Pill-rolling tremor. Respiratory system: Clear to auscultation. Respiratory effort normal. Cardiovascular system: S1 & S2 heard, RRR. No JVD, murmurs, rubs, gallops or clicks. No pedal edema. Gastrointestinal system: Abdomen is nondistended, soft and nontender. No organomegaly or masses felt. Normal bowel sounds heard. Central nervous system: Alert and oriented. No focal neurological deficits. Extremities: Symmetric 5 x 5 power. Skin: No rashes, lesions or ulcers Psychiatry: Judgement and insight appear normal. Mood & affect appropriate.     Data Reviewed: I have personally reviewed following labs and imaging studies  CBC: Recent Labs  Lab 05/19/20 0119 05/20/20 0323  WBC 6.6 5.4  HGB 12.6 11.3*  HCT 39.4 34.9*  MCV 89.5 88.1  PLT 457* 419*    Basic Metabolic Panel: Recent Labs  Lab 05/19/20 0119 05/20/20 0323  NA 137 138  K 4.2 4.0  CL 104 105  CO2 18* 24  GLUCOSE 104* 106*  BUN 20 16  CREATININE 0.99 0.75  CALCIUM 9.0 8.9    GFR: Estimated Creatinine Clearance: 48.1 mL/min (by C-G formula based on SCr of 0.75 mg/dL).  Liver Function Tests: Recent Labs  Lab 05/19/20 0119  AST 45*  ALT 48*  ALKPHOS 124  BILITOT  1.1  PROT 7.9  ALBUMIN 4.1    CBG: Recent Labs  Lab 05/19/20 0051 05/19/20 0639  GLUCAP 120* 83     Recent Results (from the past 240 hour(s))  Urine Culture     Status: None   Collection Time: 05/19/20  4:05 AM   Specimen: Urine, Clean Catch  Result Value Ref Range Status   Specimen Description   Final    URINE, CLEAN CATCH Performed at [facility] [place], [address] [place], [place] [address]    Special Requests   Final    NONE Performed at [facility] [place], [address]  [place], [place] [address]    Culture   Final    NO GROWTH Performed at [facility], [address], [place], [place] [address]    Report Status 05/20/2020 FINAL  Final  SARS Coronavirus 2 by RT PCR (hospital order, performed in [facility] hospital lab) Nasopharyngeal Nasopharyngeal Swab     Status: None   Collection Time: 05/19/20  7:48 AM   Specimen: Nasopharyngeal Swab  Result Value Ref Range Status   SARS Coronavirus 2 NEGATIVE NEGATIVE Final    Comment: (NOTE) SARS-CoV-2 target nucleic acids are NOT DETECTED.  The SARS-CoV-2 RNA is generally detectable in upper and lower respiratory specimens during the acute phase of infection. The lowest concentration of SARS-CoV-2 viral copies this assay can detect is 250 copies / mL. A negative result does not preclude SARS-CoV-2 infection and should not be used as the sole basis for treatment or other patient management decisions.  A negative result may occur with improper specimen collection / handling, submission of specimen other than nasopharyngeal swab, presence of viral mutation(s) within the areas targeted by this assay, and inadequate number of viral copies (<250 copies / mL). A negative result must be combined with clinical observations, patient history, and epidemiological information.  Fact Sheet for Patients:   BoilerBrush.com.cy  Fact Sheet for Healthcare  Providers: https://pope.com/  This test is not yet approved or  cleared by the [place] FDA and has been authorized for detection and/or diagnosis of SARS-CoV-2 by FDA under an Emergency Use Authorization (EUA).  This EUA will remain in effect (meaning this test can be used) for the duration of the COVID-19 declaration under Section 564(b)(1) of the Act, 21 U.S.C. section 360bbb-3(b)(1), unless the authorization is terminated or revoked sooner.  Performed at [facility], [address], [place], [place] [address]          Radiology Studies: CT HEAD WO CONTRAST  Result Date: 05/19/2020 CLINICAL DATA:  Altered mental status. EXAM: CT HEAD WITHOUT CONTRAST TECHNIQUE: Contiguous axial images were obtained from the base of the skull through the vertex without intravenous contrast. COMPARISON:  November 23, 2019 FINDINGS: Brain: There is mild cerebral atrophy with widening of the extra-axial spaces and ventricular dilatation. There are areas of decreased attenuation within the white matter tracts of the supratentorial brain, consistent with microvascular disease changes. Vascular: No hyperdense vessel or unexpected calcification. Skull: Normal. Negative for fracture or focal lesion. Sinuses/Orbits: No acute finding. Other: None. IMPRESSION: 1. Generalized cerebral atrophy. 2. No acute intracranial abnormality. Electronically Signed   By: Aram Candela M.D.   On: 05/19/2020 03:04   CT CERVICAL SPINE WO CONTRAST  Result Date: 05/20/2020 CLINICAL DATA:  Follow-up cervical spine fracture. EXAM: CT CERVICAL SPINE WITHOUT CONTRAST TECHNIQUE: Multidetector CT imaging of the cervical spine was performed without intravenous contrast. Multiplanar CT image reconstructions were also generated. COMPARISON:  CT cervical spine 03/26/2020 FINDINGS: Alignment: Posterior subluxation of C1 on C2 related to fracture of the anterior arch of C1. Posterior subluxation has  progressed on the right and stable on the left. 2 mm anterolisthesis C7-T1 and T1-2 is unchanged from the prior study. Skull base and vertebrae: Fracture of the anterior arch of C1 the midline with mild distraction of approximately 3 mm. Distraction has slightly increased in the interval. No other fracture identified. Soft tissues and spinal canal: Negative Disc levels: C2-3: Disc and facet degeneration. Negative for stenosis. 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 Negative  for fracture. C4-5: Moderate facet degeneration on the left. Disc degeneration and spurring. No significant spinal stenosis. Mild left foraminal narrowing. C5-6: Asymmetric facet degeneration on the left. Disc degeneration with uncinate spurring. Mild left foraminal narrowing due to spurring C6-7: Disc degeneration with diffuse uncinate spurring. Mild foraminal narrowing bilaterally. C7-T1: 2 mm anterolisthesis. Moderate facet degeneration bilaterally. Negative for stenosis. T1-2: 2 mm anterolisthesis unchanged.  Negative for stenosis. Upper chest: Negative Other: None IMPRESSION: Fracture of the anterior arch of C1 with mild progression of distraction. Posterior subluxation of C1 on C2 has progressed on the right and stable on the left. No other fracture. Cervical spondylosis as above. Electronically Signed   By: Marlan Palau M.D.   On: 05/20/2020 15:21   MR BRAIN W WO CONTRAST  Result Date: 05/20/2020 CLINICAL DATA:  Encephalopathy. Additional history provided by technologist: Encephalopathy, ongoing confusion. EXAM: MRI HEAD WITHOUT AND WITH CONTRAST TECHNIQUE: Multiplanar, multiecho pulse sequences of the brain and surrounding structures were obtained without and with intravenous contrast. CONTRAST:  5mL GADAVIST GADOBUTROL 1 MMOL/ML IV SOLN COMPARISON:  Head CT 05/19/2020, brain MRI 06/02/2018 FINDINGS: Brain: There is stable, mild generalized parenchymal atrophy. Cerebral atrophy demonstrates no  definite lobar predominance. Similar to prior MRI 06/02/2018, there is moderate to advanced patchy T2/FLAIR hyperintensity within the cerebral white matter which is nonspecific, but consistent with chronic small vessel ischemic disease. Redemonstrated chronic infarct within the right pons and midbrain. There is no acute infarct. No evidence of intracranial mass. No chronic intracranial blood products. No extra-axial fluid collection. No midline shift. No abnormal intracranial enhancement. Vascular: Expected proximal arterial flow voids. Skull and upper cervical spine: No focal marrow lesion. Degenerative changes at the C1-C2 level with associated pannus formation. Sinuses/Orbits: Right lens replacement. Visualized orbits show no acute finding. Trace ethmoid sinus mucosal thickening. No significant mastoid effusion. IMPRESSION: No evidence of acute intracranial abnormality. Stable mild generalized parenchymal atrophy and moderate/advanced cerebral white matter chronic small vessel ischemic disease. Redemonstrated chronic infarct within the right pons and midbrain. Trace ethmoid sinus mucosal thickening. Electronically Signed   By: Jackey Loge DO   On: 05/20/2020 15:13   DG Chest Port 1 View  Result Date: 05/19/2020 CLINICAL DATA:  Weakness EXAM: PORTABLE CHEST 1 VIEW COMPARISON:  None. FINDINGS: The heart size and mediastinal contours are within normal limits. Both lungs are clear. The visualized skeletal structures are unremarkable. IMPRESSION: No active disease. Electronically Signed   By: Deatra Robinson M.D.   On: 05/19/2020 02:19        Scheduled Meds: . ARIPiprazole  10 mg Oral Daily  . aspirin  81 mg Oral Daily  . atorvastatin  40 mg Oral Daily  . bisacodyl  10 mg Rectal Once  . buPROPion  450 mg Oral Daily  . carbidopa-levodopa  1 tablet Oral TID  . cholecalciferol  2,000 Units Oral Daily  . enoxaparin (LOVENOX) injection  40 mg Subcutaneous Q24H  . feeding supplement (ENSURE ENLIVE)  237  mL Oral BID BM  . gabapentin  300 mg Oral TID  . melatonin  5 mg Oral QHS  . [START ON 05/21/2020] metoprolol tartrate  25 mg Oral BID  . multivitamin with minerals  1 tablet Oral Daily  . pantoprazole  40 mg Oral Daily  . sertraline  200 mg Oral Daily   Continuous Infusions: . lactated ringers 125 mL/hr at 05/20/20 1551     LOS: 1 day    Time spent: 35 minutes    Ramiro Harvest,  MD [facility]   To contact the attending provider between 7A-7P or the covering provider during after hours 7P-7A, please log into the web site [email or web address] and access using universal [place] password for that web site. If you do not have the password, please call the hospital operator.  05/20/2020, 8:21 PM

## 2020-05-20 NOTE — Progress Notes (Signed)
Patient caregiver updated regarding patient care.

## 2020-05-21 LAB — BASIC METABOLIC PANEL
Anion gap: 8 (ref 5–15)
BUN: 16 mg/dL (ref 8–23)
CO2: 29 mmol/L (ref 22–32)
Calcium: 8.9 mg/dL (ref 8.9–10.3)
Chloride: 103 mmol/L (ref 98–111)
Creatinine, Ser: 0.61 mg/dL (ref 0.44–1.00)
GFR calc Af Amer: >60 mL/min (ref >60)
GFR calc non Af Amer: >60 mL/min (ref >60)
Glucose, Bld: 113 mg/dL — ABNORMAL HIGH (ref 70–99)
Potassium: 4.1 mmol/L (ref 3.5–5.1)
Sodium: 140 mmol/L (ref 135–145)

## 2020-05-21 LAB — CBC
HCT: 35 % — ABNORMAL LOW (ref 36.0–46.0)
Hemoglobin: 10.9 g/dL — ABNORMAL LOW (ref 12.0–15.0)
MCH: 28.4 pg (ref 26.0–34.0)
MCHC: 31.1 g/dL (ref 30.0–36.0)
MCV: 91.1 fL (ref 80.0–100.0)
Platelets: 341 10*3/uL (ref 150–400)
RBC: 3.84 MIL/uL — ABNORMAL LOW (ref 3.87–5.11)
RDW: 14.4 % (ref 11.5–15.5)
WBC: 5.2 10*3/uL (ref 4.0–10.5)
nRBC: 0 % (ref 0.0–0.2)

## 2020-05-21 LAB — MAGNESIUM: Magnesium: 1.7 mg/dL (ref 1.7–2.4)

## 2020-05-21 MED ORDER — MAGNESIUM SULFATE 4 GM/100ML IV SOLN
4.0000 g | Freq: Once | INTRAVENOUS | Status: AC
  Administered 2020-05-21: 4 g via INTRAVENOUS
  Filled 2020-05-21: qty 100

## 2020-05-21 NOTE — TOC Initial Note (Signed)
Transition of Care [facility]) - Initial/Assessment Note    Patient Details  Name: Dana [name] MRN: [id number] Date of Birth: Jul 16, 1946  Transition of Care [facility]) CM/SW Contact:    Coralyn Helling, LCSW Phone Number: 05/21/2020, 11:45 AM  Clinical Narrative:         Patient from home alone. Uses a cane and walker for ambulation. No driving due to neck brace. Patient reports independence with ADLs at baseline. Patient agreeable to SNF at dc.   Beulah Gandy [facility] CSW [phone]           Expected Discharge Plan: Skilled Nursing Facility Barriers to Discharge: Continued Medical Work up   Patient Goals and CMS Choice Patient states their goals for this hospitalization and ongoing recovery are:: "I want to go to rehab again to get stronger." CMS Medicare.gov Compare Post Acute Care list provided to:: Patient Choice offered to / list presented to : Patient  Expected Discharge Plan and Services Expected Discharge Plan: Skilled Nursing Facility In-house Referral: NA Discharge Planning Services: NA Post Acute Care Choice: Skilled Nursing Facility Living arrangements for the past 2 months: Single Family Home                 DME Arranged: N/A DME Agency: NA       HH Arranged: NA HH Agency: NA        Prior Living Arrangements/Services Living arrangements for the past 2 months: Single Family Home Lives with:: Self Patient language and need for interpreter reviewed:: Yes Do you feel safe going back to the place where you live?: Yes      Need for Family Participation in Patient Care: Yes (Comment) Care giver support system in place?: No (comment) Current home services: DME Criminal Activity/Legal Involvement Pertinent to Current Situation/Hospitalization: No - Comment as needed  Activities of Daily Living Home Assistive Devices/Equipment: Grab bars in shower, Walker (specify type), Cane (specify quad or straight), Eyeglasses, Blood pressure cuff, Scales, Brace  (specify type) ADL Screening (condition at time of admission) Patient's cognitive ability adequate to safely complete daily activities?: Yes Is the patient deaf or have difficulty hearing?: No Does the patient have difficulty seeing, even when wearing glasses/contacts?: Yes Does the patient have difficulty concentrating, remembering, or making decisions?: Yes Patient able to express need for assistance with ADLs?: Yes Does the patient have difficulty dressing or bathing?: Yes Independently performs ADLs?: No Communication: Independent Dressing (OT): Needs assistance Is this a change from baseline?: Change from baseline, expected to last >3 days Grooming: Needs assistance Is this a change from baseline?: Change from baseline, expected to last >3 days Feeding: Needs assistance Is this a change from baseline?: Change from baseline, expected to last >3 days Bathing: Needs assistance Is this a change from baseline?: Change from baseline, expected to last >3 days Toileting: Dependent Is this a change from baseline?: Change from baseline, expected to last >3days In/Out Bed: Dependent Is this a change from baseline?: Change from baseline, expected to last >3 days Walks in Home: Dependent Is this a change from baseline?: Change from baseline, expected to last >3 days Does the patient have difficulty walking or climbing stairs?: Yes Weakness of Legs: Left Weakness of Arms/Hands: None  Permission Sought/Granted Permission sought to share information with : Facility Medical sales representative, Family Supports Permission granted to share information with : Yes, Verbal Permission Granted  Share Information with NAME: Son           Emotional Assessment Appearance:: Appears stated age Attitude/Demeanor/Rapport:  Engaged Affect (typically observed): Accepting Orientation: : Oriented to Self, Oriented to Place, Oriented to  Time, Oriented to Situation Alcohol / Substance Use: Not Applicable Psych  Involvement: No (comment)  Admission diagnosis:  Confusion [R41.0] Encephalopathy [G93.40] Generalized weakness [R53.1] Patient Active Problem List   Diagnosis Date Noted  . Acute metabolic encephalopathy 05/20/2020  . Confusion 05/19/2020  . Generalized weakness 05/19/2020  . Peri-prosthetic fracture around prosthetic hip   . Fall 03/27/2020  . Failure to thrive in adult 11/23/2019  . Fall at home 11/23/2019  . Parkinsonian features 11/23/2019  . Hoarding behavior 11/23/2019  . Polyneuropathy 10/04/2019  . Insomnia 09/07/2018  . Major depressive disorder 07/25/2018  . GAD (generalized anxiety disorder) 07/25/2018  . Hip fracture, left (HCC) 09/15/2014  . Chronic back pain 09/15/2014  . History of stroke   . Hyperlipidemia   . Essential hypertension   . Spastic hemiplegia affecting nondominant side (HCC) 01/12/2012   PCP:  Juluis Rainier, MD Pharmacy:   [id number] - [place], [place] - [address] [place] [address] Phone: [phone] Fax: [phone]  [facility] - [place], [place] - [address], [address] [employer], [address] [place] [address] Phone: [phone] Fax: [phone]  [facility] [address] [place] - [address] [place] [address] Phone: [phone] Fax: [phone]     Social Determinants of Health (SDOH) Interventions    Readmission Risk Interventions Readmission Risk Prevention Plan 05/21/2020  Home Care Screening Complete  Some recent data might be hidden

## 2020-05-21 NOTE — NC FL2 (Signed)
[place] MEDICAID FL2 LEVEL OF CARE SCREENING TOOL     IDENTIFICATION  Patient Name: Dana [name] Birthdate: 05-09-1946 Sex: female Admission Date (Current Location): 05/19/2020  [facility] and [place] Number:  Producer, television/film/video and Address:  [facility],  [address] [place], [place] [address]      Provider Number: [id number]  Attending Physician Name and Address:  Rodolph Bong, MD  Relative Name and Phone Number:       Current Level of Care: Hospital Recommended Level of Care: Skilled Nursing Facility Prior Approval Number:    Date Approved/Denied:   PASRR Number: [id number]  Discharge Plan: SNF    Current Diagnoses: Patient Active Problem List   Diagnosis Date Noted  . Acute metabolic encephalopathy 05/20/2020  . Confusion 05/19/2020  . Generalized weakness 05/19/2020  . Peri-prosthetic fracture around prosthetic hip   . Fall 03/27/2020  . Failure to thrive in adult 11/23/2019  . Fall at home 11/23/2019  . Parkinsonian features 11/23/2019  . Hoarding behavior 11/23/2019  . Polyneuropathy 10/04/2019  . Insomnia 09/07/2018  . Major depressive disorder 07/25/2018  . GAD (generalized anxiety disorder) 07/25/2018  . Hip fracture, left (HCC) 09/15/2014  . Chronic back pain 09/15/2014  . History of stroke   . Hyperlipidemia   . Essential hypertension   . Spastic hemiplegia affecting nondominant side (HCC) 01/12/2012    Orientation RESPIRATION BLADDER Height & Weight     Self, Time, Situation, Place  Normal Incontinent Weight: 115 lb (52.2 kg) Height:  5\' 2"  (157.5 cm)  BEHAVIORAL SYMPTOMS/MOOD NEUROLOGICAL BOWEL NUTRITION STATUS      Incontinent Diet (See dc summary)  AMBULATORY STATUS COMMUNICATION OF NEEDS Skin   Extensive Assist Verbally Normal                       Personal Care Assistance Level of Assistance  Bathing, Feeding, Dressing Bathing Assistance: Limited assistance Feeding assistance: Independent Dressing Assistance:  Limited assistance     Functional Limitations Info  Sight, Hearing, Speech Sight Info: Impaired Hearing Info: Adequate Speech Info: Adequate    SPECIAL CARE FACTORS FREQUENCY  PT (By licensed PT), OT (By licensed OT)     PT Frequency: 5x/week OT Frequency: 5x/week            Contractures Contractures Info: Not present    Additional Factors Info  Code Status, Allergies Code Status Info: DNR Allergies Info: Percodan Oxycodone-aspirin, Oxycodone Psychotropic Info: Trazadone, Abilify, Wellbutrin         Current Medications (05/21/2020):  This is the current hospital active medication list Current Facility-Administered Medications  Medication Dose Route Frequency Provider Last Rate Last Admin  . acetaminophen (TYLENOL) tablet 650 mg  650 mg Oral Q6H PRN 05/23/2020, MD       Or  . acetaminophen (TYLENOL) suppository 650 mg  650 mg Rectal Q6H PRN Reva Bores, MD      . ARIPiprazole (ABILIFY) tablet 10 mg  10 mg Oral Daily Reva Bores, MD   10 mg at 05/21/20 0818  . aspirin chewable tablet 81 mg  81 mg Oral Daily 05/23/20, MD   81 mg at 05/21/20 0817  . atorvastatin (LIPITOR) tablet 40 mg  40 mg Oral Daily 05/23/20, MD   40 mg at 05/21/20 0817  . bisacodyl (DULCOLAX) suppository 10 mg  10 mg Rectal Once 05/23/20, MD      . buPROPion (WELLBUTRIN XL) 24 hr tablet  450 mg  450 mg Oral Daily Reva Bores, MD   450 mg at 05/21/20 0817  . carbidopa-levodopa (SINEMET IR) 25-100 MG per tablet immediate release 1 tablet  1 tablet Oral TID Reva Bores, MD   1 tablet at 05/21/20 0818  . cholecalciferol (VITAMIN D3) tablet 2,000 Units  2,000 Units Oral Daily Reva Bores, MD   2,000 Units at 05/21/20 0818  . enoxaparin (LOVENOX) injection 40 mg  40 mg Subcutaneous Q24H Reva Bores, MD   40 mg at 05/20/20 2107  . feeding supplement (ENSURE ENLIVE) (ENSURE ENLIVE) liquid 237 mL  237 mL Oral BID BM Reva Bores, MD   237 mL at 05/21/20 0820  . fentaNYL  (SUBLIMAZE) injection 12.5-50 mcg  12.5-50 mcg Intravenous Q2H PRN Reva Bores, MD      . gabapentin (NEURONTIN) capsule 300 mg  300 mg Oral TID Reva Bores, MD   300 mg at 05/21/20 0818  . lactated ringers infusion   Intravenous Continuous Rodolph Bong, MD 100 mL/hr at 05/21/20 0819 Rate Change at 05/21/20 0819  . magnesium sulfate IVPB 4 g 100 mL  4 g Intravenous Once Rodolph Bong, MD 50 mL/hr at 05/21/20 1000 4 g at 05/21/20 1000  . melatonin tablet 5 mg  5 mg Oral QHS Reva Bores, MD   5 mg at 05/20/20 2107  . metoprolol tartrate (LOPRESSOR) injection 5 mg  5 mg Intravenous Q6H PRN Reva Bores, MD      . metoprolol tartrate (LOPRESSOR) tablet 25 mg  25 mg Oral BID Rodolph Bong, MD      . multivitamin with minerals tablet 1 tablet  1 tablet Oral Daily Reva Bores, MD   1 tablet at 05/21/20 0817  . ondansetron (ZOFRAN) tablet 4 mg  4 mg Oral Q6H PRN Reva Bores, MD       Or  . ondansetron [facility]) injection 4 mg  4 mg Intravenous Q6H PRN Reva Bores, MD      . pantoprazole (PROTONIX) EC tablet 40 mg  40 mg Oral Daily Reva Bores, MD   40 mg at 05/21/20 0820  . polyethylene glycol (MIRALAX / GLYCOLAX) packet 17 g  17 g Oral Daily PRN Reva Bores, MD      . sertraline (ZOLOFT) tablet 200 mg  200 mg Oral Daily Reva Bores, MD   200 mg at 05/21/20 0818  . traZODone (DESYREL) tablet 50-100 mg  50-100 mg Oral QHS PRN Reva Bores, MD   50 mg at 05/20/20 2107     Discharge Medications: Please see discharge summary for a list of discharge medications.  Relevant Imaging Results:  Relevant Lab Results:   Additional Information ssn: [id number]  Coralyn Helling, LCSW

## 2020-05-21 NOTE — Plan of Care (Signed)
  Problem: Education: Goal: Knowledge of General Education information will improve Description: Including pain rating scale, medication(s)/side effects and non-pharmacologic comfort measures Outcome: Progressing   Problem: Health Behavior/Discharge Planning: Goal: Ability to manage health-related needs will improve Outcome: Progressing   Problem: Nutrition: Goal: Adequate nutrition will be maintained 05/21/2020 2024 by Brett Albino, RN Outcome: Progressing 05/21/2020 1937 by Brett Albino, RN Outcome: Progressing   Problem: Coping: Goal: Level of anxiety will decrease Outcome: Progressing

## 2020-05-21 NOTE — Progress Notes (Signed)
PROGRESS NOTE    Dana [name]  KGU:[id number] DOB: 1946/05/22 DOA: 05/19/2020 PCP: Juluis Rainier, MD   Chief Complaint  Patient presents with  . Hypoglycemia    Brief Narrative:  HPI per Dr. Joycelyn Man [name] is a 74 y.o. female with medical history significant of hypertension, Parkinson's like tremor, hyperlipidemia, depression, CVA with residual left-sided weakness, anemia, osteoporosis, who was hospitalized in May for [facility] for C-spine fracture after falling.  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  She did not require surgery for hip fracture but went to inpatient rehab where she was getting physical therapy.  She has been home from the SNF since last week and apparently has had a lot of trouble with her ADLs since then.  It is unclear that she is able to get up, feed herself.  She is incontinent of urine at baseline.  A nurse from the nursing facility has been going by her house and has arranged for home PT who came for the first time on 7/9.  When EMS found her there was a question of whether or not the patient had moved or eaten in the last 24 hours.  ED Course: In the ED she was noted to have an elevated CK at 245 other labs appeared normal.  CT of the head was negative.  She did seem somewhat confused according to the EDP.  We are asked to admit for completion of work-up, including MR, CT of the spine, IV fluids for possible rhabdo, PT/OT eval and decision around placement.   Assessment & Plan:   Principal Problem:   Acute metabolic encephalopathy Active Problems:   Hip fracture, left (HCC)   History of stroke   Hyperlipidemia   Essential hypertension   Fall   Confusion   Generalized weakness  1 acute metabolic encephalopathy/confusion Questionable etiology.  Likely secondary to dehydration.  Patient being hydrated aggressively with IV fluids with clinical improvement.  CT head with generalized cerebral atrophy, no acute  intracranial abnormality.  MRI brain negative for any acute abnormalities.  CT C-spine with fracture of anterior arch of C1 with mild progression of distraction.  Posterior subluxation of C1 on C2 has progressed on the right and stable on the left.  No other fracture.  Cervical spondylosis as above. Chest x-ray negative for any acute infiltrate.  Urinalysis with trace leukocytes, nitrite negative, 6-10 WBCs.  Urine cultures negative.  Ammonia level of 27.  Vitamin B12 597.  TSH within normal limits at 0.878.  Continue IV fluids.  Supportive care.  2.  C-spine fracture Patient with recent C-spine fracture noted from prior hospitalization 03/27/2020.  CT C-spine which was done on admission with fracture of anterior arch of C1 with mild progression of distraction.  Posterior subluxation of C1 on C2 has progressed on the right and stable on the left.  No other fracture.  Cervical spondylosis as above.  Patient stated recently saw her neurosurgeon and was told to continue on aspirin collar with follow-up in a month.  Outpatient follow-up with neurosurgery.  3.  Elevated CK Improved with hydration.  Follow.    4.  Dehydration IV fluids.  5.  Generalized weakness PT/OT.  6.  Recent hip fracture Nonsurgical.  Patient noted to be weightbearing.  PT.  Will likely need SNF.  7.  History of CVA, other nonhemorrhagic With residual left-sided weakness.  PT/OT.  Continue statin.  8.  Hyperlipidemia Statin.  9.  Hypertension Blood pressure  borderline.  Norvasc was discontinued.  Patient resumed back on Lopressor today which we will follow.    10.  Gastroesophageal reflux disease Continue PPI.  11.  Parkinsonian-like tremor and gait Continue Sinemet, gabapentin, Zoloft, trazodone, Abilify.  PT/OT.  12.  Fall risk Patient noted to have a walking cane that she uses at home.  PT/OT.  Will need SNF on discharge.  13.  Osteoporosis Continue Fosamax and calcium plus D.  14.  History of anemia Patient  with no overt bleeding.  Hemoglobin currently at 8.9 and stable.  Follow.     DVT prophylaxis: Lovenox Code Status: DNR Family Communication: Updated patient.  No family at bedside. Disposition:   Status is: Inpatient    Dispo: The patient is from: Home              Anticipated d/c is to: SNF              Anticipated d/c date is: Likely SNF when medically stable hopefully in 1 to 2 days.              Patient currently on IV fluids, awaiting MRI brain for further evaluation, PT OT eval pending.       Consultants:   None  Procedures:   CT head/C-spine 05/19/2020  Chest x-ray 05/19/2020  MRI brain 05/20/2020  Antimicrobials:  None   Subjective: Patient laying in fetal position with blankets over her.  Arousable.  Cold in the room.  Patient denies any chest pain.  No shortness of breath.  States is too early for her to determine how well she is feeling.  Objective: Vitals:   05/20/20 0506 05/20/20 2119 05/21/20 0521 05/21/20 0815  BP: (!) 102/53 118/62 125/62 (!) 113/49  Pulse: 73 71 71 67  Resp: 16 18 12    Temp: 97.9 F (36.6 C) 97.9 F (36.6 C) (!) 97.4 F (36.3 C)   TempSrc:  Oral Oral   SpO2: 95% 96% 99%   Weight:      Height:        Intake/Output Summary (Last 24 hours) at 05/21/2020 1150 Last data filed at 05/21/2020 1055 Gross per 24 hour  Intake 2236.44 ml  Output 675 ml  Net 1561.44 ml   Filed Weights   05/19/20 0059  Weight: 52.2 kg    Examination:  General exam: NAD.  Aspen collar on.  Pill-rolling tremor. Respiratory system: CTAB.  No wheezes, no crackles, no rhonchi.  Normal respiratory effort.  Cardiovascular system: Regular rate rhythm no murmurs rubs or gallops.  No JVD.  No lower extremity edema.  Gastrointestinal system: Abdomen is soft, nontender, nondistended, positive bowel sounds.  No rebound.  No guarding.  Central nervous system: Alert and oriented. No focal neurological deficits. Extremities: Symmetric 5 x 5 power. Skin: No  rashes, lesions or ulcers Psychiatry: Judgement and insight appear normal. Mood & affect appropriate.     Data Reviewed: I have personally reviewed following labs and imaging studies  CBC: Recent Labs  Lab 05/19/20 0119 05/20/20 0323 05/21/20 0252  WBC 6.6 5.4 5.2  HGB 12.6 11.3* 10.9*  HCT 39.4 34.9* 35.0*  MCV 89.5 88.1 91.1  PLT 457* 419* 341    Basic Metabolic Panel: Recent Labs  Lab 05/19/20 0119 05/20/20 0323 05/21/20 0252  NA 137 138 140  K 4.2 4.0 4.1  CL 104 105 103  CO2 18* 24 29  GLUCOSE 104* 106* 113*  BUN 20 16 16   CREATININE 0.99 0.75 0.61  CALCIUM  9.0 8.9 8.9  MG  --   --  1.7    GFR: Estimated Creatinine Clearance: 48.1 mL/min (by C-G formula based on SCr of 0.61 mg/dL).  Liver Function Tests: Recent Labs  Lab 05/19/20 0119  AST 45*  ALT 48*  ALKPHOS 124  BILITOT 1.1  PROT 7.9  ALBUMIN 4.1    CBG: Recent Labs  Lab 05/19/20 0051 05/19/20 0639  GLUCAP 120* 83     Recent Results (from the past 240 hour(s))  Urine Culture     Status: None   Collection Time: 05/19/20  4:05 AM   Specimen: Urine, Clean Catch  Result Value Ref Range Status   Specimen Description   Final    URINE, CLEAN CATCH Performed at [facility], [address] [place], [place] [address]    Special Requests   Final    NONE Performed at [facility], [address] [place], [place] [address]    Culture   Final    NO GROWTH Performed at [facility], [address], [place], [place] [address]    Report Status 05/20/2020 FINAL  Final  SARS Coronavirus 2 by RT PCR (hospital order, performed in [facility] hospital lab) Nasopharyngeal Nasopharyngeal Swab     Status: None   Collection Time: 05/19/20  7:48 AM   Specimen: Nasopharyngeal Swab  Result Value Ref Range Status   SARS Coronavirus 2 NEGATIVE NEGATIVE Final    Comment: (NOTE) SARS-CoV-2 target nucleic acids are NOT DETECTED.  The SARS-CoV-2 RNA is generally detectable in  upper and lower respiratory specimens during the acute phase of infection. The lowest concentration of SARS-CoV-2 viral copies this assay can detect is 250 copies / mL. A negative result does not preclude SARS-CoV-2 infection and should not be used as the sole basis for treatment or other patient management decisions.  A negative result may occur with improper specimen collection / handling, submission of specimen other than nasopharyngeal swab, presence of viral mutation(s) within the areas targeted by this assay, and inadequate number of viral copies (<250 copies / mL). A negative result must be combined with clinical observations, patient history, and epidemiological information.  Fact Sheet for Patients:   BoilerBrush.com.cy  Fact Sheet for Healthcare Providers: https://pope.com/  This test is not yet approved or  cleared by the [place] FDA and has been authorized for detection and/or diagnosis of SARS-CoV-2 by FDA under an Emergency Use Authorization (EUA).  This EUA will remain in effect (meaning this test can be used) for the duration of the COVID-19 declaration under Section 564(b)(1) of the Act, 21 U.S.C. section 360bbb-3(b)(1), unless the authorization is terminated or revoked sooner.  Performed at [facility], [address], [place], [place] [address]          Radiology Studies: CT CERVICAL SPINE WO CONTRAST  Result Date: 05/20/2020 CLINICAL DATA:  Follow-up cervical spine fracture. EXAM: CT CERVICAL SPINE WITHOUT CONTRAST TECHNIQUE: Multidetector CT imaging of the cervical spine was performed without intravenous contrast. Multiplanar CT image reconstructions were also generated. COMPARISON:  CT cervical spine 03/26/2020 FINDINGS: Alignment: Posterior subluxation of C1 on C2 related to fracture of the anterior arch of C1. Posterior subluxation has progressed on the right and stable on the left. 2 mm  anterolisthesis C7-T1 and T1-2 is unchanged from the prior study. Skull base and vertebrae: Fracture of the anterior arch of C1 the midline with mild distraction of approximately 3 mm.  Distraction has slightly increased in the interval. No other fracture identified. Soft tissues and spinal canal: Negative Disc levels: C2-3: Disc and facet degeneration. Negative for stenosis. 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 Negative for fracture. C4-5: Moderate facet degeneration on the left. Disc degeneration and spurring. No significant spinal stenosis. Mild left foraminal narrowing. C5-6: Asymmetric facet degeneration on the left. Disc degeneration with uncinate spurring. Mild left foraminal narrowing due to spurring C6-7: Disc degeneration with diffuse uncinate spurring. Mild foraminal narrowing bilaterally. C7-T1: 2 mm anterolisthesis. Moderate facet degeneration bilaterally. Negative for stenosis. T1-2: 2 mm anterolisthesis unchanged.  Negative for stenosis. Upper chest: Negative Other: None IMPRESSION: Fracture of the anterior arch of C1 with mild progression of distraction. Posterior subluxation of C1 on C2 has progressed on the right and stable on the left. No other fracture. Cervical spondylosis as above. Electronically Signed   By: Marlan Palau M.D.   On: 05/20/2020 15:21   MR BRAIN W WO CONTRAST  Result Date: 05/20/2020 CLINICAL DATA:  Encephalopathy. Additional history provided by technologist: Encephalopathy, ongoing confusion. EXAM: MRI HEAD WITHOUT AND WITH CONTRAST TECHNIQUE: Multiplanar, multiecho pulse sequences of the brain and surrounding structures were obtained without and with intravenous contrast. CONTRAST:  5mL GADAVIST GADOBUTROL 1 MMOL/ML IV SOLN COMPARISON:  Head CT 05/19/2020, brain MRI 06/02/2018 FINDINGS: Brain: There is stable, mild generalized parenchymal atrophy. Cerebral atrophy demonstrates no definite lobar predominance. Similar to prior MRI  06/02/2018, there is moderate to advanced patchy T2/FLAIR hyperintensity within the cerebral white matter which is nonspecific, but consistent with chronic small vessel ischemic disease. Redemonstrated chronic infarct within the right pons and midbrain. There is no acute infarct. No evidence of intracranial mass. No chronic intracranial blood products. No extra-axial fluid collection. No midline shift. No abnormal intracranial enhancement. Vascular: Expected proximal arterial flow voids. Skull and upper cervical spine: No focal marrow lesion. Degenerative changes at the C1-C2 level with associated pannus formation. Sinuses/Orbits: Right lens replacement. Visualized orbits show no acute finding. Trace ethmoid sinus mucosal thickening. No significant mastoid effusion. IMPRESSION: No evidence of acute intracranial abnormality. Stable mild generalized parenchymal atrophy and moderate/advanced cerebral white matter chronic small vessel ischemic disease. Redemonstrated chronic infarct within the right pons and midbrain. Trace ethmoid sinus mucosal thickening. Electronically Signed   By: Jackey Loge DO   On: 05/20/2020 15:13        Scheduled Meds: . ARIPiprazole  10 mg Oral Daily  . aspirin  81 mg Oral Daily  . atorvastatin  40 mg Oral Daily  . bisacodyl  10 mg Rectal Once  . buPROPion  450 mg Oral Daily  . carbidopa-levodopa  1 tablet Oral TID  . cholecalciferol  2,000 Units Oral Daily  . enoxaparin (LOVENOX) injection  40 mg Subcutaneous Q24H  . feeding supplement (ENSURE ENLIVE)  237 mL Oral BID BM  . gabapentin  300 mg Oral TID  . melatonin  5 mg Oral QHS  . metoprolol tartrate  25 mg Oral BID  . multivitamin with minerals  1 tablet Oral Daily  . pantoprazole  40 mg Oral Daily  . sertraline  200 mg Oral Daily   Continuous Infusions: . lactated ringers 100 mL/hr at 05/21/20 0819  . magnesium sulfate bolus IVPB 4 g (05/21/20 1000)     LOS: 2 days    Time spent: 35 minutes    Ramiro Harvest, MD [facility]   To contact the attending provider between 7A-7P or the covering provider during  after hours 7P-7A, please log into the web site [email or web address] and access using universal [place] password for that web site. If you do not have the password, please call the hospital operator.  05/21/2020, 11:50 AM

## 2020-05-22 LAB — MAGNESIUM: Magnesium: 2 mg/dL (ref 1.7–2.4)

## 2020-05-22 LAB — SARS CORONAVIRUS 2 BY RT PCR (HOSPITAL ORDER, PERFORMED IN ~~LOC~~ HOSPITAL LAB): SARS Coronavirus 2: NEGATIVE

## 2020-05-22 NOTE — Progress Notes (Signed)
Physical Therapy Treatment Patient Details Name: Basya Casavant MRN: [id number] DOB: 1946-10-28 Today's Date: 05/22/2020    History of Present Illness 74 yo female admitted with confusion, falls at home, inability to care for herself. Recent hx of C1 spinal fx and L greater trochanter fx 03/2020-both with nonop management. Hx of CVA with L side residual weakness, L THA, anemia, OA, neuropathy, chronic pain    PT Comments    Pt OOB in recliner.  Assisted with amb in hallway.  Slight unsteadiness with impaired dynamic balance and difficulty functionally walking due to neck collar.  "Feels like I fall backward".  Pt lives home alone and will need ST Re[facility] at [facility].   Follow Up Recommendations  SNF     Equipment Recommendations  None recommended by PT    Recommendations for Other Services       Precautions / Restrictions Precautions Precautions: Fall Required Braces or Orthoses: Cervical Brace Cervical Brace: Hard collar;At all times Restrictions Weight Bearing Restrictions: No    Mobility  Bed Mobility Overal bed mobility: Needs Assistance Bed Mobility: Supine to Sit     Supine to sit: Supervision;HOB elevated     General bed mobility comments: Pt OOB in recliner  Transfers Overall transfer level: Needs assistance Equipment used: Rolling walker (2 wheeled) Transfers: Sit to/from Stand Sit to Stand: Min assist        Lateral/Scoot Transfers: Min assist General transfer comment: min A to power up to standing  Ambulation/Gait Ambulation/Gait assistance: Min assist Gait Distance (Feet): 45 Feet Assistive device: Rolling walker (2 wheeled) Gait Pattern/deviations: Step-through pattern;Decreased stride length Gait velocity: decreased   General Gait Details: Assist to steady throughout distance. Pt tolerated distance well.   Stairs             Wheelchair Mobility    Modified Rankin (Stroke Patients Only)       Balance Overall balance assessment: Needs  assistance;History of Falls Sitting-balance support: Feet supported Sitting balance-Leahy Scale: Good     Standing balance support: Bilateral upper extremity supported Standing balance-Leahy Scale: Poor                              Cognition Arousal/Alertness: Awake/alert Behavior During Therapy: WFL for tasks assessed/performed Overall Cognitive Status: Within Functional Limits for tasks assessed                                 General Comments: very pleasant and independent minded      Exercises      General Comments        Pertinent Vitals/Pain Pain Assessment: Faces Pain Score: 9  Faces Pain Scale: Hurts a little bit Pain Location: neck Pain Descriptors / Indicators: Discomfort;Aching Pain Intervention(s): Monitored during session    Home Living                      Prior Function            PT Goals (current goals can now be found in the care plan section) Acute Rehab PT Goals Patient Stated Goal: to regain independence Progress towards PT goals: Progressing toward goals    Frequency    Min 3X/week      PT Plan Current plan remains appropriate    Co-evaluation              AM-PAC PT "6  Clicks" Mobility   Outcome Measure  Help needed turning from your back to your side while in a flat bed without using bedrails?: A Little Help needed moving from lying on your back to sitting on the side of a flat bed without using bedrails?: A Little Help needed moving to and from a bed to a chair (including a wheelchair)?: A Little Help needed standing up from a chair using your arms (e.g., wheelchair or bedside chair)?: A Little Help needed to walk in hospital room?: A Little Help needed climbing 3-5 steps with a railing? : A Little 6 Click Score: 18    End of Session Equipment Utilized During Treatment: Gait belt Activity Tolerance: Patient tolerated treatment well Patient left: in chair;with call bell/phone within  reach Nurse Communication: Mobility status PT Visit Diagnosis: Muscle weakness (generalized) (M62.81);Difficulty in walking, not elsewhere classified (R26.2);History of falling (Z91.81)     Time: 9562-1308 PT Time Calculation (min) (ACUTE ONLY): 26 min  Charges:  $Gait Training: 8-22 mins $Therapeutic Activity: 8-22 mins                     Felecia Shelling  PTA Acute  Rehabilitation Services Pager      [phone] Office      [phone]

## 2020-05-22 NOTE — Progress Notes (Signed)
Occupational Therapy Treatment Patient Details Name: Dana [name] MRN: [id number] DOB: Mar 25, 1946 Today's Date: 05/22/2020    History of present illness 74 yo female admitted with confusion, falls at home, inability to care for herself. Recent hx of C1 spinal fx and L greater trochanter fx 03/2020-both with nonop management. Hx of CVA with L side residual weakness, L THA, anemia, OA, neuropathy, chronic pain   OT comments  Upon arrival patient with lateral lean to L, reports most comfortable position for neck. Agreeable to get to chair to eat breakfast. Patient requires increased time for all mobility and functional transfer to recliner. Supervision for bed mobility, min A to recliner with cues for body mechanics. Total A peri care and max A don clean mesh underwear. Will continue to follow.   Follow Up Recommendations  SNF    Equipment Recommendations  Other (comment) (TBD)       Precautions / Restrictions Precautions Precautions: Fall Required Braces or Orthoses: Cervical Brace Cervical Brace: Hard collar;At all times (for 3 months per patient)       Mobility Bed Mobility Overal bed mobility: Needs Assistance Bed Mobility: Supine to Sit     Supine to sit: Supervision;HOB elevated     General bed mobility comments: Increased time.  Transfers Overall transfer level: Needs assistance Equipment used: Rolling walker (2 wheeled) Transfers: Sit to/from Stand Sit to Stand: Min assist         General transfer comment: min A to power up to standing    Balance Overall balance assessment: Needs assistance;History of Falls Sitting-balance support: Feet supported Sitting balance-Leahy Scale: Good     Standing balance support: Bilateral upper extremity supported Standing balance-Leahy Scale: Poor                             ADL either performed or assessed with clinical judgement   ADL Overall ADL's : Needs assistance/impaired Eating/Feeding:  Independent;Sitting   Grooming: Set up;Sitting                   Toilet Transfer: Minimal assistance;Ambulation;BSC;RW;Cueing for safety Toilet Transfer Details (indicate cue type and reason): to recliner, cues for backing LEs up to chair before sitting Toileting- Clothing Manipulation and Hygiene: Maximal assistance;Sit to/from stand Toileting - Clothing Manipulation Details (indicate cue type and reason): patient reports incontinence at baseline, total A peri care and max A to don clean mesh underwear     Functional mobility during ADLs: Minimal assistance;Rolling walker;Cueing for safety                 Cognition Arousal/Alertness: Awake/alert Behavior During Therapy: WFL for tasks assessed/performed Overall Cognitive Status: Within Functional Limits for tasks assessed                                                     Pertinent Vitals/ Pain       Pain Assessment: 0-10 Pain Score: 9  Pain Location: neck Pain Descriptors / Indicators: Sharp Pain Intervention(s): RN gave pain meds during session         Frequency  Min 2X/week        Progress Toward Goals  OT Goals(current goals can now be found in the care plan section)  Progress towards OT goals: Progressing toward goals  Acute Rehab OT  Goals Patient Stated Goal: to regain independence OT Goal Formulation: With patient Time For Goal Achievement: 06/03/20 Potential to Achieve Goals: Good ADL Goals Pt Will Perform Grooming: sitting;with supervision Pt Will Perform Upper Body Bathing: with set-up;sitting Pt Will Perform Lower Body Bathing: with min assist;with set-up;sit to/from stand Pt Will Perform Upper Body Dressing: with set-up;sitting Pt Will Perform Lower Body Dressing: with supervision;sit to/from stand Pt Will Transfer to Toilet: with supervision;stand pivot transfer Pt Will Perform Toileting - Clothing Manipulation and hygiene: with supervision;sit to/from stand  Plan  Discharge plan remains appropriate       AM-PAC OT "6 Clicks" Daily Activity     Outcome Measure   Help from another person eating meals?: None Help from another person taking care of personal grooming?: A Little Help from another person toileting, which includes using toliet, bedpan, or urinal?: A Lot Help from another person bathing (including washing, rinsing, drying)?: A Lot Help from another person to put on and taking off regular upper body clothing?: A Little Help from another person to put on and taking off regular lower body clothing?: A Lot 6 Click Score: 16    End of Session Equipment Utilized During Treatment: Rolling walker;Cervical collar  OT Visit Diagnosis: Unsteadiness on feet (R26.81);Other abnormalities of gait and mobility (R26.89);Repeated falls (R29.6);Muscle weakness (generalized) (M62.81) Pain - part of body:  (neck)   Activity Tolerance Patient tolerated treatment well   Patient Left in chair;with call bell/phone within reach;with chair alarm set   Nurse Communication Mobility status;Other (comment) (purewick placement)        Time: 3825-0539 OT Time Calculation (min): 25 min  Charges: OT General Charges $OT Visit: 1 Visit OT Treatments $Self Care/Home Management : 23-37 mins  Marlyce Huge OT Pager: [phone]   Carmelia Roller 05/22/2020, 12:43 PM

## 2020-05-22 NOTE — Care Management Important Message (Signed)
Important Message  Patient Details IM Letter given to [name] SW Case Manager to present to the Patient Name: Dana [name] MRN: [id number] Date of Birth: 08-Jun-1946   Medicare Important Message Given:  Yes     Caren Macadam 05/22/2020, 10:03 AM

## 2020-05-22 NOTE — Progress Notes (Signed)
PROGRESS NOTE    Dana [name]  NGE:[id number] DOB: [birth year] DOA: 05/19/2020 PCP: Juluis Rainier, MD   Chief Complaint  Patient presents with  . Hypoglycemia    Brief Narrative:  HPI per Dr. Joycelyn [facility] Dana [name] 74 y.o.femalewith medical history significant ofhypertension, Parkinson's like tremor, hyperlipidemia, depression, CVA with residual left-sided weakness, anemia, osteoporosis, who was hospitalized in May for [facility] for C-spine fracture after falling. 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 She did not require surgery for hip fracture but went to inpatient rehab where she was getting physical therapy. She has been home from the SNFsince last week and apparently has had Dana [name] lot of trouble with her ADLs since then. It is unclear that she is able to get up, feed herself. She is incontinent of urine at baseline. Dana [name] nurse from the nursing facility has been going by her house and has arranged for home PT who came for the first time on 7/9.When EMS found her there was Dana [name] question of whether or not the patient had moved or eaten in the last 24 hours.  ED Course:In the ED she was noted to have an elevated CK at 245 other labs appeared normal. CT of the head was negative. She did seem somewhat confused according to the EDP.  We are asked to admit for completion of work-up, including MR, CT of the spine, IV fluids for possible rhabdo, PT/OT eval and decision around placement.  Assessment & Plan:   Principal Problem:   Acute metabolic encephalopathy Active Problems:   Hip fracture, left (HCC)   History of stroke   Hyperlipidemia   Essential hypertension   Fall   Confusion   Generalized weakness  1 acute metabolic encephalopathy/confusion Questionable etiology.  Suspected secondary to dehydration.   CT head with generalized cerebral atrophy, no acute intracranial abnormality. MRI brain negative for any acute abnormalities.   CT  C-spine with fracture of anterior arch of C1 with mild progression of distraction.  Posterior subluxation of C1 on C2 has progressed on the right and stable on the left.  No other fracture.  Cervical spondylosis.  Chest x-ray negative for any acute infiltrate.   Negative urine culture Ammonia wnl, b12 wnl, TSH wnl She's improved, Dana [name]&Ox3 today, continue to monitor  2.  C-spine fracture Patient with recent C-spine fracture noted from prior hospitalization 03/27/2020.  CT C-spine which was done on admission with fracture of anterior arch of C1 with mild progression of distraction.  Posterior subluxation of C1 on C2 has progressed on the right and stable on the left.  No other fracture.  Cervical spondylosis as above.  Patient stated recently saw her neurosurgeon and was told to continue on aspirin collar with follow-up in Dana [name] month.  Outpatient follow-up with neurosurgery.  3.  Elevated CK Improved with hydration.  Follow.    4.  Dehydration IV fluids.  5.  Generalized weakness PT/OT.  6.  Recent hip fracture Nonsurgical.  Patient noted to be weightbearing.  PT.  Will likely need SNF.  7.  History of CVA, other nonhemorrhagic With residual left-sided weakness.  PT/OT.  Continue statin.  8.  Hyperlipidemia Statin.  9.  Hypertension Blood pressure borderline.  Norvasc was discontinued.  Patient resumed back on Lopressor.    10.  Gastroesophageal reflux disease Continue PPI.  11.  Parkinsonian-like tremor and gait Continue Sinemet, gabapentin, Zoloft, trazodone, Abilify.  PT/OT.  12.  Fall risk Patient noted to have Dana [name]  walking cane that she uses at home.  PT/OT.  Will need SNF on discharge.  13.  Osteoporosis Continue Fosamax and calcium plus D.  14.  History of anemia Patient with no overt bleeding.  Continue to monitor.  DVT prophylaxis: lovenox Code Status: DNR Family Communication: none at bedside Disposition:   Status is: Inpatient  Remains inpatient  appropriate because:Inpatient level of care appropriate due to severity of illness   Dispo: The patient is from: SNF              Anticipated d/c is to: SNF              Anticipated d/c date is: 1 day              Patient currently is not medically stable to d/c.  Consultants:   none  Procedures: none  Antimicrobials: Anti-infectives (From admission, onward)   None      Subjective: Dana [name]&Ox3, no new complaints  Objective: Vitals:   05/21/20 2054 05/22/20 0640 05/22/20 0810 05/22/20 1349  BP: 124/60 (!) 152/62 (!) 151/70 (!) 119/58  Pulse: 84 62 63 70  Resp: 16 16  17   Temp: 98.2 F (36.8 C) 97.7 F (36.5 C)  98.1 F (36.7 C)  TempSrc: Oral Oral  Oral  SpO2: 97% 98%  98%  Weight:      Height:        Intake/Output Summary (Last 24 hours) at 05/22/2020 1853 Last data filed at 05/22/2020 1800 Gross per 24 hour  Intake 3178.18 ml  Output 1900 ml  Net 1278.18 ml   Filed Weights   05/19/20 0059  Weight: 52.2 kg    Examination:  General exam: Appears calm and comfortable  c collar in place Respiratory system: Clear to auscultation. Respiratory effort normal. Cardiovascular system: S1 & S2 heard, RRR.  Gastrointestinal system: Abdomen is nondistended, soft and nontender. Central nervous system: Alert and oriented. No focal neurological deficits. Extremities: Symmetric 5 x 5 power. Skin: No rashes, lesions or ulcers Psychiatry: Judgement and insight appear normal. Mood & affect appropriate.     Data Reviewed: I have personally reviewed following labs and imaging studies  CBC: Recent Labs  Lab 05/19/20 0119 05/20/20 0323 05/21/20 0252  WBC 6.6 5.4 5.2  HGB 12.6 11.3* 10.9*  HCT 39.4 34.9* 35.0*  MCV 89.5 88.1 91.1  PLT 457* 419* 341    Basic Metabolic Panel: Recent Labs  Lab 05/19/20 0119 05/20/20 0323 05/21/20 0252 05/22/20 0302  NA 137 138 140  --   K 4.2 4.0 4.1  --   CL 104 105 103  --   CO2 18* 24 29  --   GLUCOSE 104* 106* 113*  --     BUN 20 16 16   --   CREATININE 0.99 0.75 0.61  --   CALCIUM 9.0 8.9 8.9  --   MG  --   --  1.7 2.0    GFR: Estimated Creatinine Clearance: 48.1 mL/min (by C-G formula based on SCr of 0.61 mg/dL).  Liver Function Tests: Recent Labs  Lab 05/19/20 0119  AST 45*  ALT 48*  ALKPHOS 124  BILITOT 1.1  PROT 7.9  ALBUMIN 4.1    CBG: Recent Labs  Lab 05/19/20 0051 05/19/20 0639  GLUCAP 120* 83     Recent Results (from the past 240 hour(s))  Urine Culture     Status: None   Collection Time: 05/19/20  4:05 AM   Specimen: Urine, Clean Catch  Result  Value Ref Range Status   Specimen Description   Final    URINE, CLEAN CATCH Performed at [facility] [place], [address] [place], [place] [address]    Special Requests   Final    NONE Performed at [facility] [place], [address] [place], [place] [address]    Culture   Final    NO GROWTH Performed at [facility], [address] [place], [place] [address]    Report Status 05/20/2020 FINAL  Final  SARS Coronavirus 2 by RT PCR (hospital order, performed in [facility] hospital lab) Nasopharyngeal Nasopharyngeal Swab     Status: None   Collection Time: 05/19/20  7:48 AM   Specimen: Nasopharyngeal Swab  Result Value Ref Range Status   SARS Coronavirus 2 NEGATIVE NEGATIVE Final    Comment: (NOTE) SARS-CoV-2 target nucleic acids are NOT DETECTED.  The SARS-CoV-2 RNA is generally detectable in upper and lower respiratory specimens during the acute phase of infection. The lowest concentration of SARS-CoV-2 viral copies this assay can detect is 250 copies / mL. Dana [name] negative result does not preclude SARS-CoV-2 infection and should not be used as the sole basis for treatment or other patient management decisions.  Dana [name] negative result may occur with improper specimen collection / handling, submission of specimen other than nasopharyngeal swab, presence of viral mutation(s) within the areas targeted by this assay,  and inadequate number of viral copies (<250 copies / mL). Dana [name] negative result must be combined with clinical observations, patient history, and epidemiological information.  Fact Sheet for Patients:   BoilerBrush.com.cyhttps://www.fda.gov/media/136312/download  Fact Sheet for Healthcare Providers: https://pope.com/https://www.fda.gov/media/136313/download  This test is not yet approved or  cleared by the [place] FDA and has been authorized for detection and/or diagnosis of SARS-CoV-2 by FDA under an Emergency Use Authorization (EUA).  This EUA will remain in effect (meaning this test can be used) for the duration of the COVID-19 declaration under Section 564(b)(1) of the Act, 21 U.S.C. section 360bbb-3(b)(1), unless the authorization is terminated or revoked sooner.  Performed at [facility] [place], [address] [place], [place] [address]   SARS Coronavirus 2 by RT PCR (hospital order, performed in [facility] hospital lab) Nasopharyngeal Nasopharyngeal Swab     Status: None   Collection Time: 05/22/20  4:36 PM   Specimen: Nasopharyngeal Swab  Result Value Ref Range Status   SARS Coronavirus 2 NEGATIVE NEGATIVE Final    Comment: (NOTE) SARS-CoV-2 target nucleic acids are NOT DETECTED.  The SARS-CoV-2 RNA is generally detectable in upper and lower respiratory specimens during the acute phase of infection. The lowest concentration of SARS-CoV-2 viral copies this assay can detect is 250 copies / mL. Dana [name] negative result does not preclude SARS-CoV-2 infection and should not be used as the sole basis for treatment or other patient management decisions.  Dana Romain negative result may occur with improper specimen collection / handling, submission of specimen other than nasopharyngeal swab, presence of viral mutation(s) within the areas targeted by this assay, and inadequate number of viral copies (<250 copies / mL). Adante Courington negative result must be combined with clinical observations, patient history, and epidemiological  information.  Fact Sheet for Patients:   BoilerBrush.com.cyhttps://www.fda.gov/media/136312/download  Fact Sheet for Healthcare Providers: https://pope.com/https://www.fda.gov/media/136313/download  This test is not yet approved or  cleared by the [place] FDA and has been authorized for detection and/or diagnosis of SARS-CoV-2 by FDA under an Emergency Use Authorization (EUA).  This EUA will remain  in effect (meaning this test can be used) for the duration of the COVID-19 declaration under Section 564(b)(1) of the Act, 21 U.S.C. section 360bbb-3(b)(1), unless the authorization is terminated or revoked sooner.  Performed at [facility], [address], [place], [place] [address]          Radiology Studies: No results found.      Scheduled Meds: . ARIPiprazole  10 mg Oral Daily  . aspirin  81 mg Oral Daily  . atorvastatin  40 mg Oral Daily  . bisacodyl  10 mg Rectal Once  . buPROPion  450 mg Oral Daily  . carbidopa-levodopa  1 tablet Oral TID  . cholecalciferol  2,000 Units Oral Daily  . enoxaparin (LOVENOX) injection  40 mg Subcutaneous Q24H  . feeding supplement (ENSURE ENLIVE)  237 mL Oral BID BM  . gabapentin  300 mg Oral TID  . melatonin  5 mg Oral QHS  . metoprolol tartrate  25 mg Oral BID  . multivitamin with minerals  1 tablet Oral Daily  . pantoprazole  40 mg Oral Daily  . sertraline  200 mg Oral Daily   Continuous Infusions: . lactated ringers 100 mL/hr at 05/22/20 1816     LOS: 3 days    Time spent: over 30 min    Lacretia Nicks, MD [facility]   To contact the attending provider between 7A-7P or the covering provider during after hours 7P-7A, please log into the web site [email or web address] and access using universal [place] password for that web site. If you do not have the password, please call the hospital operator.  05/22/2020, 6:53 PM

## 2020-05-22 NOTE — TOC Progression Note (Addendum)
Transition of Care [facility]) - Progression Note    Patient Details  Name: Dana [name] MRN: [id number] Date of Birth: 13-Feb-1946  Transition of Care [facility]) CM/SW Contact  Clearance Coots, LCSW Phone Number: 05/22/2020, 12:30 PM  Clinical Narrative:    [facility] will accept the patient tomorrow, pending [facility] authorization approval.  CSW discussed w/ pt. She will be in her co pay days when she returns to SNF. Patient report she is aware.  Reference Auth# [id number], clinicals faxed.    Expected Discharge Plan: Skilled Nursing Facility Barriers to Discharge: Continued Medical Work up  Expected Discharge Plan and Services Expected Discharge Plan: Skilled Nursing Facility In-house Referral: NA Discharge Planning Services: NA Post Acute Care Choice: Skilled Nursing Facility Living arrangements for the past 2 months: Single Family Home                 DME Arranged: N/A DME Agency: NA       HH Arranged: NA HH Agency: NA         Social Determinants of Health (SDOH) Interventions    Readmission Risk Interventions Readmission Risk Prevention Plan 05/21/2020  Home Care Screening Complete  Some recent data might be hidden

## 2020-05-23 LAB — COMPREHENSIVE METABOLIC PANEL
ALT: 9 U/L (ref 0–44)
AST: 19 U/L (ref 15–41)
Albumin: 3.2 g/dL — ABNORMAL LOW (ref 3.5–5.0)
Alkaline Phosphatase: 88 U/L (ref 38–126)
Anion gap: 8 (ref 5–15)
BUN: 18 mg/dL (ref 8–23)
CO2: 29 mmol/L (ref 22–32)
Calcium: 8.5 mg/dL — ABNORMAL LOW (ref 8.9–10.3)
Chloride: 103 mmol/L (ref 98–111)
Creatinine, Ser: 0.65 mg/dL (ref 0.44–1.00)
GFR calc Af Amer: >60 mL/min (ref >60)
GFR calc non Af Amer: >60 mL/min (ref >60)
Glucose, Bld: 106 mg/dL — ABNORMAL HIGH (ref 70–99)
Potassium: 3.8 mmol/L (ref 3.5–5.1)
Sodium: 140 mmol/L (ref 135–145)
Total Bilirubin: 0.4 mg/dL (ref 0.3–1.2)
Total Protein: 6 g/dL — ABNORMAL LOW (ref 6.5–8.1)

## 2020-05-23 LAB — CBC WITH DIFFERENTIAL/PLATELET
Abs Immature Granulocytes: 0.02 10*3/uL (ref 0.00–0.07)
Basophils Absolute: 0 10*3/uL (ref 0.0–0.1)
Basophils Relative: 1 %
Eosinophils Absolute: 0.3 10*3/uL (ref 0.0–0.5)
Eosinophils Relative: 6 %
HCT: 33.9 % — ABNORMAL LOW (ref 36.0–46.0)
Hemoglobin: 10.5 g/dL — ABNORMAL LOW (ref 12.0–15.0)
Immature Granulocytes: 0 %
Lymphocytes Relative: 34 %
Lymphs Abs: 1.7 10*3/uL (ref 0.7–4.0)
MCH: 28.4 pg (ref 26.0–34.0)
MCHC: 31 g/dL (ref 30.0–36.0)
MCV: 91.6 fL (ref 80.0–100.0)
Monocytes Absolute: 0.4 10*3/uL (ref 0.1–1.0)
Monocytes Relative: 9 %
Neutro Abs: 2.5 10*3/uL (ref 1.7–7.7)
Neutrophils Relative %: 50 %
Platelets: 297 10*3/uL (ref 150–400)
RBC: 3.7 MIL/uL — ABNORMAL LOW (ref 3.87–5.11)
RDW: 13.9 % (ref 11.5–15.5)
WBC: 5 10*3/uL (ref 4.0–10.5)
nRBC: 0 % (ref 0.0–0.2)

## 2020-05-23 LAB — VITAMIN B1: Vitamin B1 (Thiamine): 155.4 nmol/L (ref 66.5–200.0)

## 2020-05-23 LAB — PHOSPHORUS: Phosphorus: 3.1 mg/dL (ref 2.5–4.6)

## 2020-05-23 LAB — MAGNESIUM: Magnesium: 1.8 mg/dL (ref 1.7–2.4)

## 2020-05-23 NOTE — Progress Notes (Signed)
Physical Therapy Treatment Patient Details Name: Dana [name] MRN: [id number] DOB: [birth year] Today's Date: 05/23/2020    History of Present Illness 74 yo female admitted with confusion, falls at home, inability to care for herself. Recent hx of C1 spinal fx and L greater trochanter fx 03/2020-both with nonop management. Hx of CVA with L side residual weakness, L THA, anemia, OA, neuropathy, chronic pain    PT Comments    Assisted NT with transfer to [facility] and back to bed.General bed mobility comments:   Follow Up Recommendations  SNF     Equipment Recommendations  None recommended by PT    Recommendations for Other Services       Precautions / Restrictions Precautions Precautions: Fall Required Braces or Orthoses: Cervical Brace Cervical Brace: Hard collar;At all times    Mobility  Bed Mobility Overal bed mobility: Needs Assistance Bed Mobility: Supine to Sit Rolling: Supervision     Sit to supine: Min guard;Supervision   General bed mobility comments: Assisted OOB and back to bed  Transfers   Equipment used: None Transfers: Sit to/from [employer] Sit to Stand: Min assist;Min guard Stand pivot transfers: Min assist       General transfer comment: 255 VC's on proper hand placement assisted on/off BSC 1/4 pivot no AD  Ambulation/Gait                 Stairs             Wheelchair Mobility    Modified Rankin (Stroke Patients Only)       Balance                                            Cognition   Behavior During Therapy: WFL for tasks assessed/performed Overall Cognitive Status: Within Functional Limits for tasks assessed                                 General Comments: very pleasant and independent minded      Exercises      General Comments        Pertinent Vitals/Pain Pain Assessment: Faces Faces Pain Scale: Hurts a little bit Pain Location: neck Pain Descriptors /  Indicators: Discomfort;Aching Pain Intervention(s): Monitored during session;Repositioned    Home Living                      Prior Function            PT Goals (current goals can now be found in the care plan section) Progress towards PT goals: Progressing toward goals    Frequency    Min 2X/week      PT Plan Current plan remains appropriate    Co-evaluation              AM-PAC PT "6 Clicks" Mobility   Outcome Measure  Help needed turning from your back to your side while in a flat bed without using bedrails?: A Little Help needed moving from lying on your back to sitting on the side of a flat bed without using bedrails?: A Little Help needed moving to and from a bed to a chair (including a wheelchair)?: A Little Help needed standing up from a chair using your arms (e.g., wheelchair or bedside chair)?: A Little Help needed to  walk in hospital room?: A Little Help needed climbing 3-5 steps with a railing? : A Little 6 Click Score: 18    End of Session Equipment Utilized During Treatment: Gait belt Activity Tolerance: Patient tolerated treatment well Patient left: in bed;with call bell/phone within reach Nurse Communication: Mobility status PT Visit Diagnosis: Muscle weakness (generalized) (M62.81);Difficulty in walking, not elsewhere classified (R26.2);History of falling (Z91.81)     Time: 1497-0263 PT Time Calculation (min) (ACUTE ONLY): 15 min  Charges:  $Therapeutic Activity: 8-22 mins                     {Neveah Bang  PTA Acute  Rehabilitation Services Pager      [phone] Office      [phone]

## 2020-05-23 NOTE — TOC Transition Note (Signed)
Transition of Care [facility]) - CM/SW Discharge Note   Patient Details  Name: Dana [name] MRN: [id number] Date of Birth: [birth year]  [facility] CM/SW Contact:  Amada Jupiter, LCSW Phone Number: 05/23/2020, 2:36 PM   Clinical Narrative:   Pt cleared for d/c today and insurance auth received.  Pt and son aware transfer today via PTAR.  No further TOC needs.    Final next level of care: Skilled Nursing Facility Barriers to Discharge: Barriers Resolved   Patient Goals and CMS Choice Patient states their goals for this hospitalization and ongoing recovery are:: "I want to go to rehab again to get stronger." CMS Medicare.gov Compare Post Acute Care list provided to:: Patient Choice offered to / list presented to : Patient  Discharge Placement              Patient chooses bed at: [facility] Patient to be transferred to facility by: PTAR Name of family member notified: son Patient and family notified of of transfer: 05/23/20  Discharge Plan and Services In-house Referral: NA Discharge Planning Services: NA Post Acute Care Choice: Skilled Nursing Facility          DME Arranged: N/A DME Agency: NA       HH Arranged: NA HH Agency: NA        Social Determinants of Health (SDOH) Interventions     Readmission Risk Interventions Readmission Risk Prevention Plan 05/21/2020  Home Care Screening Complete  Some recent data might be hidden

## 2020-05-23 NOTE — Progress Notes (Signed)
Verbal report given to RN at [facility].  

## 2020-05-23 NOTE — Discharge Summary (Signed)
Physician Discharge Summary  Dana [name] ZOX:[id number] DOB: Oct 05, 1946 DOA: 05/19/2020  PCP: Dana Rainier, MD  Admit date: 05/19/2020 Discharge date: 05/23/2020  Time spent: 40 minutes  Recommendations for Outpatient Follow-up:  1. Follow outpatient CBC/CMP 2. Continue aspen collar, follow with neurosurgery outpatient  3. Continue discussion regarding possible long term care after rehab - I don't think she's safe to live independently at this point  Discharge Diagnoses:  Principal Problem:   Acute metabolic encephalopathy Active Problems:   Hip fracture, left (HCC)   History of stroke   Hyperlipidemia   Essential hypertension   Fall   Confusion   Generalized weakness  Discharge Condition: stable  Diet recommendation: heart healthy   Filed Weights   05/19/20 0059  Weight: 52.2 kg    History of present illness:  HPI per Dr. Joycelyn [name] [facility] Dana [name] 74 y.o.femalewith medical history significant ofhypertension, Parkinson's like tremor, hyperlipidemia, depression, CVA with residual left-sided weakness, anemia, osteoporosis, who was hospitalized in May for [facility] for C-spine fracture after falling. 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 She did not require surgery for hip fracture but went to inpatient rehab where she was getting physical therapy. She has been home from the SNFsince last week and apparently has had Dana [name] lot of trouble with her ADLs since then. It is unclear that she is able to get up, feed herself. She is incontinent of urine at baseline. Dana [name] nurse from the nursing facility has been going by her house and has arranged for home PT who came for the first time on 7/9.When EMS found her there was Dana [name] question of whether or not the patient had moved or eaten in the last 24 hours.  ED Course:In the ED she was noted to have an elevated CK at 245 other labs appeared normal. CT of the head was negative. She did seem somewhat  confused according to the EDP.  We are asked to admit for completion of work-up, including MR, CT of the spine, IV fluids for possible rhabdo, PT/OT eval and decision around placement.  She was admitted for altered mental status and failure to thrive (difficulty with ADL's since discharge from SNF).  Her encephalopathy was thought to be due to dehydration.  She's improved with IVF.  Now back to baseline, plan for d/c to SNF.  Will need further discussions on long term care plan at SNF as she does not seem safe to live independently at this time.  Hospital Course:  1 acute metabolic encephalopathy/confusion Questionable etiology. Suspected secondary to dehydration.  CT head with generalized cerebral atrophy, no acute intracranial abnormality. MRI brain negative for any acute abnormalities.  CT C-spine with fracture of anterior arch of C1 with mild progression of distraction. Posterior subluxation of C1 on C2 has progressed on the right and stable on the left. No other fracture. Cervical spondylosis.  Chest x-ray negative for any acute infiltrate.  Negative urine culture Ammonia wnl, b12 wnl, TSH wnl She's improved, Dana [name]&Ox3 today, continue to monitor - stable at discharge  2. C-spine fracture Patient with recent C-spine fracture noted from prior hospitalization 03/27/2020. CT C-spine which was done on admission with fracture of anterior arch of C1 with mild progression of distraction. Posterior subluxation of C1 on C2 has progressed on the right and stable on the left. No other fracture. Cervical spondylosis as above. Patient stated recently saw her neurosurgeon and was told to continue on aspirin collar with follow-up in Dana [name]  month. Outpatient follow-up with neurosurgery (discussed with nsgy who noted ok for outpatient follow up)  3. Elevated CK Improved with hydration. Follow.   4. Dehydration IV fluids.  5. Generalized weakness PT/OT.  6. Recent hip  fracture Nonsurgical. Patient noted to be weightbearing. PT. Will likely need SNF.  7. History of CVA, other nonhemorrhagic With residual left-sided weakness. PT/OT. Continue statin.  8. Hyperlipidemia Statin.  9. Hypertension Blood pressure borderline.Norvasc was discontinued. Patient resumed back on Lopressor.   10. Gastroesophageal reflux disease ContinuePPI.  11. Parkinsonian-like tremor and gait Continue Sinemet, gabapentin, Zoloft, trazodone, Abilify. PT/OT.  12. Fall risk Patient noted to have Dana [name] walking cane that she uses at home. PT/OT.Will need SNF on discharge.  13. Osteoporosis Continue Fosamax and calcium plus D.  14. History of anemia Patient with no overt bleeding.Continue to monitor.  Procedures:  none  Consultations:  none  Discharge Exam: Vitals:   05/23/20 0529 05/23/20 1320  BP: (!) 132/59 110/75  Pulse: 63 75  Resp: 15 18  Temp: 98.6 F (37 C) 97.7 F (36.5 C)  SpO2: 97% 98%   Feels ready for d/c No new complaints Discussed d/c plan with son over phone  General: No acute distress. c collar  Cardiovascular: Heart sounds show Dana [name] regular rate, and rhythm Lungs: Clear to auscultation bilaterally  Abdomen: Soft, nontender, nondistended  Neurological: Alert and oriented 3. Moves all extremities 4 . Cranial nerves II through XII grossly intact. Skin: Warm and dry. No rashes or lesions. Extremities: No clubbing or cyanosis. No edema.  Discharge Instructions   Discharge Instructions    Call MD for:  difficulty breathing, headache or visual disturbances   Complete by: As directed    Call MD for:  extreme fatigue   Complete by: As directed    Call MD for:  hives   Complete by: As directed    Call MD for:  persistant dizziness or light-headedness   Complete by: As directed    Call MD for:  persistant nausea and vomiting   Complete by: As directed    Call MD for:  redness, tenderness, or signs of  infection (pain, swelling, redness, odor or green/yellow discharge around incision site)   Complete by: As directed    Call MD for:  severe uncontrolled pain   Complete by: As directed    Call MD for:  temperature >100.4   Complete by: As directed    Diet - low sodium heart healthy   Complete by: As directed    Discharge instructions   Complete by: As directed    You were seen for confusion which was thought to be related to dehydration.  You've improved with IV fluids.    We've stopped your amlodipine.    Please follow up with your PCP outpatient.   Please continue to follow up with neurosurgery.  Please continue your c collar.  Return for new, recurrent, or worsening symptoms.  Please ask your PCP to request records from this hospitalization so they know what was done and what the next steps will be.   Increase activity slowly   Complete by: As directed      Allergies as of 05/23/2020      Reactions   Percodan [oxycodone-aspirin] Nausea Only   Oxycodone Nausea Only      Medication List    STOP taking these medications   amLODipine 5 MG tablet Commonly known as: NORVASC     TAKE these medications   acetaminophen 500 MG  tablet Commonly known as: TYLENOL Take 2 tablets (1,000 mg total) by mouth every 8 (eight) hours as needed. What changed: reasons to take this   alendronate 70 MG tablet Commonly known as: FOSAMAX Take 1 tablet (70 mg total) by mouth once Sharod Petsch week. Take with 8 oz of water 30 minutes prior to food/meds. Do not lie down for 30 minutes.   ARIPiprazole 10 MG tablet Commonly known as: Abilify Take 1 tablet (10 mg total) by mouth daily.   aspirin 81 MG chewable tablet Chew 81 mg by mouth daily.   atorvastatin 40 MG tablet Commonly known as: LIPITOR Take 1 tablet (40 mg total) by mouth daily.   bisacodyl 10 MG/30ML Enem Commonly known as: FLEET Place 10 mg rectally once. If Milk Of Magnesium P.O doesn't work. Give 10mg  of Bisacodyl rectally.    buPROPion 150 MG 24 hr tablet Commonly known as: WELLBUTRIN XL TAKE 3 TABLETS BY MOUTH  DAILY   carbidopa-levodopa 25-100 MG tablet Commonly known as: SINEMET IR Take 1 tablet by mouth 3 (three) times daily.   gabapentin 300 MG capsule Commonly known as: NEURONTIN Take 1 capsule (300 mg total) by mouth 3 (three) times daily.   lactose free nutrition Liqd Take 237 mLs by mouth 2 (two) times daily between meals.   melatonin 5 MG Tabs Take 5 mg by mouth at bedtime.   metoprolol tartrate 25 MG tablet Commonly known as: LOPRESSOR Take 25 mg by mouth 2 (two) times daily.   multivitamin tablet Take 1 tablet by mouth daily.   pantoprazole 40 MG tablet Commonly known as: PROTONIX Take 40 mg by mouth daily.   sertraline 100 MG tablet Commonly known as: ZOLOFT TAKE 2 TABLETS BY MOUTH  DAILY   tiZANidine 2 MG tablet Commonly known as: ZANAFLEX Take 1 tablet (2 mg total) by mouth 2 (two) times daily. QAM and QHS   traZODone 50 MG tablet Commonly known as: DESYREL TAKE 1 TO 2 TABLETS BY  MOUTH AT BEDTIME AS NEEDED  FOR SLEEP   Vitamin D 50 MCG (2000 UT) Caps Take 2,000 Units by mouth daily.      Allergies  Allergen Reactions  . Percodan [Oxycodone-Aspirin] Nausea Only  . Oxycodone Nausea Only      The results of significant diagnostics from this hospitalization (including imaging, microbiology, ancillary and laboratory) are listed below for reference.    Significant Diagnostic Studies: CT HEAD WO CONTRAST  Result Date: 05/19/2020 CLINICAL DATA:  Altered mental status. EXAM: CT HEAD WITHOUT CONTRAST TECHNIQUE: Contiguous axial images were obtained from the base of the skull through the vertex without intravenous contrast. COMPARISON:  November 23, 2019 FINDINGS: Brain: There is mild cerebral atrophy with widening of the extra-axial spaces and ventricular dilatation. There are areas of decreased attenuation within the white matter tracts of the supratentorial brain,  consistent with microvascular disease changes. Vascular: No hyperdense vessel or unexpected calcification. Skull: Normal. Negative for fracture or focal lesion. Sinuses/Orbits: No acute finding. Other: None. IMPRESSION: 1. Generalized cerebral atrophy. 2. No acute intracranial abnormality. Electronically Signed   By: November 25, 2019 M.D.   On: 05/19/2020 03:04   CT CERVICAL SPINE WO CONTRAST  Result Date: 05/20/2020 CLINICAL DATA:  Follow-up cervical spine fracture. EXAM: CT CERVICAL SPINE WITHOUT CONTRAST TECHNIQUE: Multidetector CT imaging of the cervical spine was performed without intravenous contrast. Multiplanar CT image reconstructions were also generated. COMPARISON:  CT cervical spine 03/26/2020 FINDINGS: Alignment: Posterior subluxation of C1 on C2 related to fracture of the  anterior arch of C1. Posterior subluxation has progressed on the right and stable on the left. 2 mm anterolisthesis C7-T1 and T1-2 is unchanged from the prior study. Skull base and vertebrae: Fracture of the anterior arch of C1 the midline with mild distraction of approximately 3 mm. Distraction has slightly increased in the interval. No other fracture identified. Soft tissues and spinal canal: Negative Disc levels: C2-3: Disc and facet degeneration. Negative for stenosis. 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 Negative for fracture. C4-5: Moderate facet degeneration on the left. Disc degeneration and spurring. No significant spinal stenosis. Mild left foraminal narrowing. C5-6: Asymmetric facet degeneration on the left. Disc degeneration with uncinate spurring. Mild left foraminal narrowing due to spurring C6-7: Disc degeneration with diffuse uncinate spurring. Mild foraminal narrowing bilaterally. C7-T1: 2 mm anterolisthesis. Moderate facet degeneration bilaterally. Negative for stenosis. T1-2: 2 mm anterolisthesis unchanged.  Negative for stenosis. Upper chest: Negative Other: None IMPRESSION:  Fracture of the anterior arch of C1 with mild progression of distraction. Posterior subluxation of C1 on C2 has progressed on the right and stable on the left. No other fracture. Cervical spondylosis as above. Electronically Signed   By: Marlan Palauharles  Clark M.D.   On: 05/20/2020 15:21   MR BRAIN W WO CONTRAST  Result Date: 05/20/2020 CLINICAL DATA:  Encephalopathy. Additional history provided by technologist: Encephalopathy, ongoing confusion. EXAM: MRI HEAD WITHOUT AND WITH CONTRAST TECHNIQUE: Multiplanar, multiecho pulse sequences of the brain and surrounding structures were obtained without and with intravenous contrast. CONTRAST:  5mL GADAVIST GADOBUTROL 1 MMOL/ML IV SOLN COMPARISON:  Head CT 05/19/2020, brain MRI 06/02/2018 FINDINGS: Brain: There is stable, mild generalized parenchymal atrophy. Cerebral atrophy demonstrates no definite lobar predominance. Similar to prior MRI 06/02/2018, there is moderate to advanced patchy T2/FLAIR hyperintensity within the cerebral white matter which is nonspecific, but consistent with chronic small vessel ischemic disease. Redemonstrated chronic infarct within the right pons and midbrain. There is no acute infarct. No evidence of intracranial mass. No chronic intracranial blood products. No extra-axial fluid collection. No midline shift. No abnormal intracranial enhancement. Vascular: Expected proximal arterial flow voids. Skull and upper cervical spine: No focal marrow lesion. Degenerative changes at the C1-C2 level with associated pannus formation. Sinuses/Orbits: Right lens replacement. Visualized orbits show no acute finding. Trace ethmoid sinus mucosal thickening. No significant mastoid effusion. IMPRESSION: No evidence of acute intracranial abnormality. Stable mild generalized parenchymal atrophy and moderate/advanced cerebral white matter chronic small vessel ischemic disease. Redemonstrated chronic infarct within the right pons and midbrain. Trace ethmoid sinus  mucosal thickening. Electronically Signed   By: Jackey LogeKyle  Golden DO   On: 05/20/2020 15:13   DG Chest Port 1 View  Result Date: 05/19/2020 CLINICAL DATA:  Weakness EXAM: PORTABLE CHEST 1 VIEW COMPARISON:  None. FINDINGS: The heart size and mediastinal contours are within normal limits. Both lungs are clear. The visualized skeletal structures are unremarkable. IMPRESSION: No active disease. Electronically Signed   By: Deatra RobinsonKevin  Herman M.D.   On: 05/19/2020 02:19    Microbiology: Recent Results (from the past 240 hour(s))  Urine Culture     Status: None   Collection Time: 05/19/20  4:05 AM   Specimen: Urine, Clean Catch  Result Value Ref Range Status   Specimen Description   Final    URINE, CLEAN CATCH Performed at [facility] [place], [address] [place], [place] [id number]    Special Requests   Final    NONE Performed at [facility],  [address] [place], [place] [address]    Culture   Final    NO GROWTH Performed at [facility], [address], [place], [place] [address]    Report Status 05/20/2020 FINAL  Final  SARS Coronavirus 2 by RT PCR (hospital order, performed in [facility] hospital lab) Nasopharyngeal Nasopharyngeal Swab     Status: None   Collection Time: 05/19/20  7:48 AM   Specimen: Nasopharyngeal Swab  Result Value Ref Range Status   SARS Coronavirus 2 NEGATIVE NEGATIVE Final    Comment: (NOTE) SARS-CoV-2 target nucleic acids are NOT DETECTED.  The SARS-CoV-2 RNA is generally detectable in upper and lower respiratory specimens during the acute phase of infection. The lowest concentration of SARS-CoV-2 viral copies this assay can detect is 250 copies / mL. Charlina Dwight negative result does not preclude SARS-CoV-2 infection and should not be used as the sole basis for treatment or other patient management decisions.  Dai Mcadams negative result may occur with improper specimen collection / handling, submission of specimen other than nasopharyngeal swab, presence  of viral mutation(s) within the areas targeted by this assay, and inadequate number of viral copies (<250 copies / mL). Adalin Vanderploeg negative result must be combined with clinical observations, patient history, and epidemiological information.  Fact Sheet for Patients:   BoilerBrush.com.cy  Fact Sheet for Healthcare Providers: https://pope.com/  This test is not yet approved or  cleared by the [place] FDA and has been authorized for detection and/or diagnosis of SARS-CoV-2 by FDA under an Emergency Use Authorization (EUA).  This EUA will remain in effect (meaning this test can be used) for the duration of the COVID-19 declaration under Section 564(b)(1) of the Act, 21 U.S.C. section 360bbb-3(b)(1), unless the authorization is terminated or revoked sooner.  Performed at [facility], [address] [place], [place] [address]   SARS Coronavirus 2 by RT PCR (hospital order, performed in [facility] hospital lab) Nasopharyngeal Nasopharyngeal Swab     Status: None   Collection Time: 05/22/20  4:36 PM   Specimen: Nasopharyngeal Swab  Result Value Ref Range Status   SARS Coronavirus 2 NEGATIVE NEGATIVE Final    Comment: (NOTE) SARS-CoV-2 target nucleic acids are NOT DETECTED.  The SARS-CoV-2 RNA is generally detectable in upper and lower respiratory specimens during the acute phase of infection. The lowest concentration of SARS-CoV-2 viral copies this assay can detect is 250 copies / mL. Carinna Newhart negative result does not preclude SARS-CoV-2 infection and should not be used as the sole basis for treatment or other patient management decisions.  Jabri Blancett negative result may occur with improper specimen collection / handling, submission of specimen other than nasopharyngeal swab, presence of viral mutation(s) within the areas targeted by this assay, and inadequate number of viral copies (<250 copies / mL). Hermela Hardt negative result must be combined with  clinical observations, patient history, and epidemiological information.  Fact Sheet for Patients:   BoilerBrush.com.cy  Fact Sheet for Healthcare Providers: https://pope.com/  This test is not yet approved or  cleared by the [place] FDA and has been authorized for detection and/or diagnosis of SARS-CoV-2 by FDA under an Emergency Use Authorization (EUA).  This EUA will remain in effect (meaning this test can be used) for the duration of the COVID-19 declaration under Section 564(b)(1) of the Act, 21 U.S.C. section 360bbb-3(b)(1), unless the authorization is terminated or revoked sooner.  Performed at [facility], [address] [place], [place] [address]  Labs: Basic Metabolic Panel: Recent Labs  Lab 05/19/20 0119 05/20/20 0323 05/21/20 0252 05/22/20 0302 05/23/20 0303  NA 137 138 140  --  140  K 4.2 4.0 4.1  --  3.8  CL 104 105 103  --  103  CO2 18* 24 29  --  29  GLUCOSE 104* 106* 113*  --  106*  BUN --  18  CREATININE 0.99 0.75 0.61  --  0.65  CALCIUM 9.0 8.9 8.9  --  8.5*  MG  --   --  1.7 2.0 1.8  PHOS  --   --   --   --  3.1   Liver Function Tests: Recent Labs  Lab 05/19/20 0119 05/23/20 0303  AST 45* 19  ALT 48* 9  ALKPHOS 124 88  BILITOT 1.1 0.4  PROT 7.9 6.0*  ALBUMIN 4.1 3.2*   No results for input(s): LIPASE, AMYLASE in the last 168 hours. Recent Labs  Lab 05/20/20 0323  AMMONIA 27   CBC: Recent Labs  Lab 05/19/20 0119 05/20/20 0323 05/21/20 0252 05/23/20 0303  WBC 6.6 5.4 5.2 5.0  NEUTROABS  --   --   --  2.5  HGB 12.6 11.3* 10.9* 10.5*  HCT 39.4 34.9* 35.0* 33.9*  MCV 89.5 88.1 91.1 91.6  PLT 457* 419* 341 297   Cardiac Enzymes: Recent Labs  Lab 05/19/20 0119 05/20/20 0323  CKTOTAL 245* 88   BNP: BNP (last 3 results) No results for input(s): BNP in the last 8760 hours.  ProBNP (last 3 results) No results for input(s): PROBNP in the  last 8760 hours.  CBG: Recent Labs  Lab 05/19/20 0051 05/19/20 0639  GLUCAP 120* 83       Signed:  Lacretia Nicks MD.  [facility] 05/23/2020, 2:19 PM

## 2020-05-24 ENCOUNTER — Encounter: Payer: Self-pay | Admitting: Internal Medicine

## 2020-05-24 ENCOUNTER — Non-Acute Institutional Stay (SKILLED_NURSING_FACILITY): Payer: Medicare Other | Admitting: Internal Medicine

## 2020-05-24 DIAGNOSIS — Z8673 Personal history of transient ischemic attack (TIA), and cerebral infarction without residual deficits: Secondary | ICD-10-CM

## 2020-05-24 DIAGNOSIS — G20 Parkinson's disease: Secondary | ICD-10-CM

## 2020-05-24 DIAGNOSIS — Z96649 Presence of unspecified artificial hip joint: Secondary | ICD-10-CM

## 2020-05-24 DIAGNOSIS — S12001D Unspecified nondisplaced fracture of first cervical vertebra, subsequent encounter for fracture with routine healing: Secondary | ICD-10-CM | POA: Diagnosis not present

## 2020-05-24 DIAGNOSIS — R54 Age-related physical debility: Secondary | ICD-10-CM

## 2020-05-24 DIAGNOSIS — G629 Polyneuropathy, unspecified: Secondary | ICD-10-CM

## 2020-05-24 DIAGNOSIS — R2681 Unsteadiness on feet: Secondary | ICD-10-CM

## 2020-05-24 DIAGNOSIS — M810 Age-related osteoporosis without current pathological fracture: Secondary | ICD-10-CM

## 2020-05-24 DIAGNOSIS — I10 Essential (primary) hypertension: Secondary | ICD-10-CM

## 2020-05-24 DIAGNOSIS — E860 Dehydration: Secondary | ICD-10-CM

## 2020-05-24 DIAGNOSIS — M978XXD Periprosthetic fracture around other internal prosthetic joint, subsequent encounter: Secondary | ICD-10-CM | POA: Diagnosis not present

## 2020-05-24 DIAGNOSIS — R296 Repeated falls: Secondary | ICD-10-CM

## 2020-05-24 DIAGNOSIS — G20C Parkinsonism, unspecified: Secondary | ICD-10-CM

## 2020-05-24 NOTE — Progress Notes (Signed)
Provider:  Gwenith Spitz. Renato Gails, D.O., C.M.D. Location:  [facility] Room Number: 112 P Place of Service:  SNF (31)  PCP: Juluis Rainier, MD Patient Care Team: Juluis Rainier, MD as PCP - General (Family Medicine)  Extended Emergency Contact Information Primary Emergency Contact: Annia Friendly Address: [address]          [place], [place] [address] Darden Amber of [place] Home Phone: [phone] Relation: Friend Secondary Emergency Contact: Moishe Spice, [place] [address] Darden Amber of [place] Phone: [phone] Relation: Son  Code Status: DNR Goals of Care: Advanced Directive information Advanced Directives 05/24/2020  Does Patient Have a Medical Advance Directive? Yes  Type of Advance Directive Out of facility DNR (pink MOST or yellow form)  Does patient want to make changes to medical advance directive? No - Patient declined  Copy of Healthcare Power of Attorney in Chart? -  Pre-existing out of facility DNR order (yellow form or pink MOST form) -      Chief Complaint  Patient presents with  . New Admit To SNF    New Admit to SNF     HPI: Patient is a 74 y.o. female seen today for readmission to [facility].  She had been here with [place] 5/25-6/24 after periprosthetic fx of her hip and closed nondisplaced fx of her C1 with a fall.  She has a h/o stroke with left hemiplegia, htn, parkinsonism, polyneuropathy, depression and osteoporosis.  She'd been discharged home with [facility] PT, OT and her walker with plan for 1-2 wk f/u with PCP.  7/11, she presented to the ED with increased confusion.  She was struggling with her ADLs at home.  She got her first home health PT session on 7/9.  Unfortunately, when EMS arrived, it appears she may not have moved or eaten for 24 hrs.  She had an elevated CK of 245 in the ED.  CT head was negative. She was admitted for further imaging includig MRI, CT spine, for IVFs for  possible rhabdomyolysis, PT, OT eval and to determine if she needed long-term care.  Her delirium was felt to be due to dehydration.  Her CT of the brain showed generalized atrophy but no acute intracranial abnormality.  MRI of the brain was negative for acute abnormalities as well.  Her CT of the C-spine showed a fracture of the anterior arch of C1 with mild progression of the distraction.  She had posterior subluxation of C1 on C2 that had also progressed on the right and was stable on the left.  She had no other fracture.  She had cervical spondylosis.  Chest x-ray was negative for pneumonia and urine culture was negative for infection.  Ammonia level B12 and TSH were all within normal range.  She was alert and oriented x3 on the day of discharge July 11.  Her neurosurgeon had recently seen her and had told her to continue wearing the Aspen collar and during this day discussion was held with neurosurgery who recommended it was okay for outpatient follow-up.  Her elevated CK and dehydration improved with IV fluids.  She is now here for PT OT due to her generalized weakness.  She is now weightbearing.  Her amlodipine was stopped due to soft BP.  Covid testing was negative.  She had her Covid vaccines from [facility] February 26 and March 24.  She is eager to get back home.  There is a plan in place already for a CNA from here to begin working with her at home as her private caregiver.  She is back right now to get strong enough. Past Medical History:  Diagnosis Date  . Anemia    none since hysterectomy  . Arthritis    LlTHA 11'15(had a fall)-"remains tender"  . Depression   . Heart murmur    history of  . Hyperlipidemia   . Hypertension   . Parkinson's disease (HCC)   . Stroke [facility])    4-5 yrs ago(left sided weakness) -no residual  . Transfusion history    age 89- "hysterectomy"   Past Surgical History:  Procedure Laterality Date  . ABDOMINAL HYSTERECTOMY    . COLONOSCOPY WITH  PROPOFOL N/A 12/25/2014   Procedure: COLONOSCOPY WITH PROPOFOL;  Surgeon: Charolett Bumpers, MD;  Location: [facility];  Service: Endoscopy;  Laterality: N/A;  . FRACTURE SURGERY  01/22/12   fx of eye socket from fall-"retained plate"  . HAND SURGERY Left    nerve release -many yrs ago  . HIP ARTHROPLASTY Left 09/16/2014   Procedure: ARTHROPLASTY BIPOLAR HIP;  Surgeon: Sheral Apley, MD;  Location: [facility] OR;  Service: Orthopedics;  Laterality: Left;    Social History   Socioeconomic History  . Marital status: Widowed    Spouse name: Not on file  . Number of children: Not on file  . Years of education: Not on file  . Highest education level: Not on file  Occupational History  . Not on file  Tobacco Use  . Smoking status: Never Smoker  . Smokeless tobacco: Never Used  Vaping Use  . Vaping Use: Never used  Substance and Sexual Activity  . Alcohol use: Yes    Comment: occasional beer  . Drug use: Never  . Sexual activity: Not on file  Other Topics Concern  . Not on file  Social History Narrative   Widowed.    Social Determinants of Health   Financial Resource Strain:   . Difficulty of Paying Living Expenses:   Food Insecurity:   . Worried About Programme researcher, broadcasting/film/video in the Last Year:   . Barista in the Last Year:   Transportation Needs:   . Freight forwarder (Medical):   Marland Kitchen Lack of Transportation (Non-Medical):   Physical Activity:   . Days of Exercise per Week:   . Minutes of Exercise per Session:   Stress:   . Feeling of Stress :   Social Connections:   . Frequency of Communication with Friends and Family:   . Frequency of Social Gatherings with Friends and Family:   . Attends Religious Services:   . Active Member of Clubs or Organizations:   . Attends Banker Meetings:   Marland Kitchen Marital Status:     reports that she has never smoked. She has never used smokeless tobacco. She reports current alcohol use. She reports that she does not use  drugs.  Functional Status Survey:    Family History  Problem Relation Age of Onset  . Hyperlipidemia Mother   . Hypertension Mother   . Diabetes Mother   . Heart disease Mother        aortic vavle  replaced  . Hypertension Father   . Hyperlipidemia Father   . Heart disease Father        aortic valve replaced  . Alzheimer's disease Father     Health Maintenance  Topic Date Due  .  Hepatitis C Screening  Never done  . TETANUS/TDAP  Never done  . DEXA SCAN  Never done  . PNA vac Low Risk Adult (1 of 2 - PCV13) Never done  . INFLUENZA VACCINE  06/09/2020  . COLONOSCOPY  12/25/2024  . COVID-19 Vaccine  Completed    Allergies  Allergen Reactions  . Percodan [Oxycodone-Aspirin] Nausea Only  . Oxycodone Nausea Only    Outpatient Encounter Medications as of 05/24/2020  Medication Sig  . acetaminophen (TYLENOL) 500 MG tablet Take 2 tablets (1,000 mg total) by mouth every 8 (eight) hours as needed.  Marland Kitchen alendronate (FOSAMAX) 70 MG tablet Take 1 tablet (70 mg total) by mouth once a week. Take with 8 oz of water 30 minutes prior to food/meds. Do not lie down for 30 minutes.  . ARIPiprazole (ABILIFY) 10 MG tablet Take 1 tablet (10 mg total) by mouth daily.  Marland Kitchen aspirin 81 MG chewable tablet Chew 81 mg by mouth daily.  Marland Kitchen atorvastatin (LIPITOR) 40 MG tablet Take 1 tablet (40 mg total) by mouth daily.  . bisacodyl (FLEET) 10 MG/30ML ENEM Place 10 mg rectally once. If Milk Of Magnesium P.O doesn't work. Give  of Bisacodyl rectally.  Marland Kitchen buPROPion (WELLBUTRIN XL) 150 MG 24 hr tablet TAKE 3 TABLETS BY MOUTH  DAILY  . carbidopa-levodopa (SINEMET IR) 25-100 MG tablet Take 1 tablet by mouth 3 (three) times daily.  . Cholecalciferol (VITAMIN D) 50 MCG (2000 UT) CAPS Take 2,000 Units by mouth daily.   Marland Kitchen gabapentin (NEURONTIN) 300 MG capsule Take 1 capsule (300 mg total) by mouth 3 (three) times daily.  Marland Kitchen lactose free nutrition (BOOST) LIQD Take 237 mLs by mouth 2 (two) times daily between meals.   . melatonin 5 MG TABS Take 5 mg by mouth at bedtime.   . metoprolol tartrate (LOPRESSOR) 25 MG tablet Take 25 mg by mouth 2 (two) times daily.  . Multiple Vitamin (MULTIVITAMIN) tablet Take 1 tablet by mouth daily.  . pantoprazole (PROTONIX) 40 MG tablet Take 40 mg by mouth daily.  . sertraline (ZOLOFT) 100 MG tablet TAKE 2 TABLETS BY MOUTH  DAILY  . tiZANidine (ZANAFLEX) 2 MG tablet Take 1 tablet (2 mg total) by mouth 2 (two) times daily. QAM and QHS  . traZODone (DESYREL) 50 MG tablet TAKE 1 TO 2 TABLETS BY  MOUTH AT BEDTIME AS NEEDED  FOR SLEEP   No facility-administered encounter medications on file as of 05/24/2020.    Review of Systems  Constitutional: Negative for chills and fever.  HENT: Negative for congestion.   Eyes: Negative for blurred vision.  Respiratory: Negative for cough and shortness of breath.   Cardiovascular: Negative for chest pain, palpitations and leg swelling.  Gastrointestinal: Negative for abdominal pain and constipation.  Genitourinary: Negative for dysuria.  Musculoskeletal: Positive for falls and neck pain.  Skin: Negative for rash.  Neurological: Positive for tremors. Negative for dizziness and loss of consciousness.  Endo/Heme/Allergies: Bruises/bleeds easily.  Psychiatric/Behavioral: Negative for depression. The patient does not have insomnia.     Vitals:   05/24/20 1515  BP: 134/69  Pulse: (!) 102  Temp: 98 F (36.7 C)  SpO2: 96%  Weight: 115 lb (52.2 kg)  Height:  (1.575 m)   Body mass index is 21.03 kg/m. Physical Exam Vitals reviewed.  Constitutional:      Appearance: Normal appearance.  HENT:     Head: Normocephalic and atraumatic.     Right Ear: External ear normal.  Left Ear: External ear normal.     Nose: Nose normal.     Mouth/Throat:     Pharynx: Oropharynx is clear.  Eyes:     Extraocular Movements: Extraocular movements intact.     Pupils: Pupils are equal, round, and reactive to light.     Comments: glasses   Cardiovascular:     Rate and Rhythm: Normal rate and regular rhythm.     Pulses: Normal pulses.     Heart sounds: Normal heart sounds.  Pulmonary:     Effort: Pulmonary effort is normal.     Breath sounds: Normal breath sounds. No rales.  Abdominal:     General: Bowel sounds are normal.     Palpations: Abdomen is soft.     Tenderness: There is no abdominal tenderness.  Musculoskeletal:     Cervical back: Neck supple.     Right lower leg: No edema.     Left lower leg: No edema.     Comments: Aspen collar in place with protective dressings beneath to protect her shoulders  Skin:    General: Skin is warm and dry.     Capillary Refill: Capillary refill takes less than 2 seconds.     Comments: Multiple small ecchymoses now yellow in color on arms and legs from her falls after going home  Neurological:     General: No focal deficit present.     Mental Status: She is alert and oriented to person, place, and time.     Motor: Weakness present.     Gait: Gait abnormal.     Comments: tremor  Psychiatric:        Mood and Affect: Mood normal.     Labs reviewed: Basic Metabolic Panel: Recent Labs    04/01/20 0333 04/01/20 0333 04/02/20 0310 04/02/20 0310 04/04/20 0000 05/20/20 0323 05/21/20 0252 05/22/20 0302 05/23/20 0303  NA 140   < > 140  --    < > 138 140  --  140  K 4.5   < > 4.7  --    < > 4.0 4.1  --  3.8  CL 104   < > 108  --    < > 105 103  --  103  CO2 29   < > 22  --    < > 24 29  --  29  GLUCOSE 101*   < > 105*  --    < > 106* 113*  --  106*  BUN 29*   < > 34*  --    < > 16 16  --  18  CREATININE 0.93   < > 0.71  --    < > 0.75 0.61  --  0.65  CALCIUM 8.7*   < > 9.0  --    < > 8.9 8.9  --  8.5*  MG 2.1   < > 2.0   < >  --   --  1.7 2.0 1.8  PHOS 4.8*  --  4.4  --   --   --   --   --  3.1   < > = values in this interval not displayed.   Liver Function Tests: Recent Labs    04/02/20 0310 05/19/20 0119 05/23/20 0303  AST 24 45* 19  ALT 8 48* 9  ALKPHOS 81  124 88  BILITOT 0.6 1.1 0.4  PROT 6.2* 7.9 6.0*  ALBUMIN 3.2* 4.1 3.2*   No results for input(s): LIPASE, AMYLASE in  the last 8760 hours. Recent Labs    05/20/20 0323  AMMONIA 27   CBC: Recent Labs    04/02/20 0310 04/02/20 0310 04/04/20 0000 05/19/20 0119 05/20/20 0323 05/21/20 0252 05/23/20 0303  WBC 8.2  --  7.2   < > 5.4 5.2 5.0  NEUTROABS 5.1  --  4  --   --   --  2.5  HGB 10.9*   < > 10.4*   < > 11.3* 10.9* 10.5*  HCT 34.8*   < > 30*   < > 34.9* 35.0* 33.9*  MCV 90.6  --   --    < > 88.1 91.1 91.6  PLT 342   < > 374   < > 419* 341 297   < > = values in this interval not displayed.   Cardiac Enzymes: Recent Labs    05/19/20 0119 05/20/20 0323  CKTOTAL 245* 88   BNP: Invalid input(s): POCBNP Lab Results  Component Value Date   HGBA1C (H) 03/11/2011    6.0 (NOTE)                                                                       According to the ADA Clinical Practice Recommendations for 2011, when HbA1c is used as a screening test:   >=6.5%   Diagnostic of Diabetes Mellitus           (if abnormal result  is confirmed)  5.7-6.4%   Increased risk of developing Diabetes Mellitus  References:Diagnosis and Classification of Diabetes Mellitus,Diabetes Care,2011,34(Suppl 1):S62-S69 and Standards of Medical Care in         Diabetes - 2011,Diabetes Care,2011,34  (Suppl 1):S11-S61.   Lab Results  Component Value Date   TSH 0.878 05/20/2020   Lab Results  Component Value Date   VITAMINB12 597 05/20/2020   No results found for: FOLATE No results found for: IRON, TIBC, FERRITIN  Imaging and Procedures obtained prior to SNF admission: CT HEAD WO CONTRAST  Result Date: 05/19/2020 CLINICAL DATA:  Altered mental status. EXAM: CT HEAD WITHOUT CONTRAST TECHNIQUE: Contiguous axial images were obtained from the base of the skull through the vertex without intravenous contrast. COMPARISON:  November 23, 2019 FINDINGS: Brain: There is mild cerebral atrophy with widening of  the extra-axial spaces and ventricular dilatation. There are areas of decreased attenuation within the white matter tracts of the supratentorial brain, consistent with microvascular disease changes. Vascular: No hyperdense vessel or unexpected calcification. Skull: Normal. Negative for fracture or focal lesion. Sinuses/Orbits: No acute finding. Other: None. IMPRESSION: 1. Generalized cerebral atrophy. 2. No acute intracranial abnormality. Electronically Signed   By: Aram Candela M.D.   On: 05/19/2020 03:04   CT CERVICAL SPINE WO CONTRAST  Result Date: 05/20/2020 CLINICAL DATA:  Follow-up cervical spine fracture. EXAM: CT CERVICAL SPINE WITHOUT CONTRAST TECHNIQUE: Multidetector CT imaging of the cervical spine was performed without intravenous contrast. Multiplanar CT image reconstructions were also generated. COMPARISON:  CT cervical spine 03/26/2020 FINDINGS: Alignment: Posterior subluxation of C1 on C2 related to fracture of the anterior arch of C1. Posterior subluxation has progressed on the right and stable on the left. 2 mm anterolisthesis C7-T1 and T1-2 is unchanged from the prior study. Skull base and vertebrae: Fracture of  the anterior arch of C1 the midline with mild distraction of approximately 3 mm. Distraction has slightly increased in the interval. No other fracture identified. Soft tissues and spinal canal: Negative Disc levels: C2-3: Disc and facet degeneration. Negative for stenosis. 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 Negative for fracture. C4-5: Moderate facet degeneration on the left. Disc degeneration and spurring. No significant spinal stenosis. Mild left foraminal narrowing. C5-6: Asymmetric facet degeneration on the left. Disc degeneration with uncinate spurring. Mild left foraminal narrowing due to spurring C6-7: Disc degeneration with diffuse uncinate spurring. Mild foraminal narrowing bilaterally. C7-T1: 2 mm anterolisthesis. Moderate facet  degeneration bilaterally. Negative for stenosis. T1-2: 2 mm anterolisthesis unchanged.  Negative for stenosis. Upper chest: Negative Other: None IMPRESSION: Fracture of the anterior arch of C1 with mild progression of distraction. Posterior subluxation of C1 on C2 has progressed on the right and stable on the left. No other fracture. Cervical spondylosis as above. Electronically Signed   By: Charles  Clark M.D.   On: Marlan Palau07/10/2020 15:21   MR BRAIN W WO CONTRAST  Result Date: 05/20/2020 CLINICAL DATA:  Encephalopathy. Additional history provided by technologist: Encephalopathy, ongoing confusion. EXAM: MRI HEAD WITHOUT AND WITH CONTRAST TECHNIQUE: Multiplanar, multiecho pulse sequences of the brain and surrounding structures were obtained without and with intravenous contrast. CONTRAST:  5mL GADAVIST GADOBUTROL 1 MMOL/ML IV SOLN COMPARISON:  Head CT 05/19/2020, brain MRI 06/02/2018 FINDINGS: Brain: There is stable, mild generalized parenchymal atrophy. Cerebral atrophy demonstrates no definite lobar predominance. Similar to prior MRI 06/02/2018, there is moderate to advanced patchy T2/FLAIR hyperintensity within the cerebral white matter which is nonspecific, but consistent with chronic small vessel ischemic disease. Redemonstrated chronic infarct within the right pons and midbrain. There is no acute infarct. No evidence of intracranial mass. No chronic intracranial blood products. No extra-axial fluid collection. No midline shift. No abnormal intracranial enhancement. Vascular: Expected proximal arterial flow voids. Skull and upper cervical spine: No focal marrow lesion. Degenerative changes at the C1-C2 level with associated pannus formation. Sinuses/Orbits: Right lens replacement. Visualized orbits show no acute finding. Trace ethmoid sinus mucosal thickening. No significant mastoid effusion. IMPRESSION: No evidence of acute intracranial abnormality. Stable mild generalized parenchymal atrophy and  moderate/advanced cerebral white matter chronic small vessel ischemic disease. Redemonstrated chronic infarct within the right pons and midbrain. Trace ethmoid sinus mucosal thickening. Electronically Signed   By: Jackey LogeKyle  Golden DO   On: 05/20/2020 15:13   DG Chest Port 1 View  Result Date: 05/19/2020 CLINICAL DATA:  Weakness EXAM: PORTABLE CHEST 1 VIEW COMPARISON:  None. FINDINGS: The heart size and mediastinal contours are within normal limits. Both lungs are clear. The visualized skeletal structures are unremarkable. IMPRESSION: No active disease. Electronically Signed   By: Deatra RobinsonKevin  Herman M.D.   On: 05/19/2020 02:19    Assessment/Plan 1. Dehydration -did not eat and drink enough on her own at home and wound up falling and back in the hospital confused -here for therapy and to arrange adequate assistance at home  2. Frailty syndrome in geriatric patient -remains and will need caregiver assistance much of the time  3. Closed nondisplaced fracture of first cervical vertebra with routine healing, unspecified fracture morphology, subsequent encounter -cont aspen collar and f/u with neurosurgery as planned  4. Periprosthetic fracture of hip, subsequent encounter -gradually recovering but had further impacted gait -is WBAT with walker  5. Parkinsonism, unspecified Parkinsonism type (HCC) -at baseline gait declining -cont sinemt -she is adamant about returning home and  getting hired help there instead of living in snf -may need 24hr care  6. Polyneuropathy -also contributory to falls, cont gabapentin  7. Unsteady gait -multifactorial:  PD, psych meds, neuropathy, recent fx, use of neck brace, gen weakness, and poor intake, etc  8. Recurrent falls -as in #7, cont PT, OT  9. Senile osteoporosis -cont fosamax, vitamin D  10. Essential hypertension -bp controlled, cont same regimen  11. History of stroke -also contributes to falls, cont secondary prevention with bp control, baby  asa   Family/ staff Communication: discussed with CNA and snf nurse  Labs/tests ordered:  Cbc, bmp  Casie Sturgeon L. Trai Ells, D.O. Geriatrics [facility] [address], [place] [address] Cell Phone (Mon-Fri 8am-5pm):  [phone] On Call:  [phone] & follow prompts after 5pm & weekends Office Phone:  [phone] Office Fax:  [phone]

## 2020-05-29 LAB — BASIC METABOLIC PANEL
BUN: 13 (ref 4–21)
CO2: 25 — AB (ref 13–22)
Chloride: 106 (ref 99–108)
Creatinine: 0.6 (ref 0.5–1.1)
Glucose: 88
Potassium: 4.5 (ref 3.4–5.3)
Sodium: 142 (ref 137–147)

## 2020-05-29 LAB — COMPREHENSIVE METABOLIC PANEL
Calcium: 9 (ref 8.7–10.7)
GFR calc non Af Amer: 88.47

## 2020-05-29 LAB — CBC: RBC: 3.77 — AB (ref 3.87–5.11)

## 2020-05-29 LAB — CBC AND DIFFERENTIAL
HCT: 33 — AB (ref 36–46)
Hemoglobin: 10.9 — AB (ref 12.0–16.0)
Platelets: 348 (ref 150–399)
WBC: 4.8

## 2020-06-06 ENCOUNTER — Encounter: Payer: Self-pay | Admitting: Family

## 2020-06-06 ENCOUNTER — Other Ambulatory Visit: Payer: Self-pay | Admitting: Family

## 2020-06-06 ENCOUNTER — Non-Acute Institutional Stay (SKILLED_NURSING_FACILITY): Payer: Medicare Other | Admitting: Family

## 2020-06-06 DIAGNOSIS — G20C Parkinsonism, unspecified: Secondary | ICD-10-CM

## 2020-06-06 DIAGNOSIS — M978XXD Periprosthetic fracture around other internal prosthetic joint, subsequent encounter: Secondary | ICD-10-CM | POA: Diagnosis not present

## 2020-06-06 DIAGNOSIS — S12001D Unspecified nondisplaced fracture of first cervical vertebra, subsequent encounter for fracture with routine healing: Secondary | ICD-10-CM | POA: Diagnosis not present

## 2020-06-06 DIAGNOSIS — Z96649 Presence of unspecified artificial hip joint: Secondary | ICD-10-CM

## 2020-06-06 DIAGNOSIS — R2681 Unsteadiness on feet: Secondary | ICD-10-CM

## 2020-06-06 DIAGNOSIS — K219 Gastro-esophageal reflux disease without esophagitis: Secondary | ICD-10-CM

## 2020-06-06 DIAGNOSIS — G20 Parkinson's disease: Secondary | ICD-10-CM

## 2020-06-06 DIAGNOSIS — F320 Major depressive disorder, single episode, mild: Secondary | ICD-10-CM

## 2020-06-06 DIAGNOSIS — G629 Polyneuropathy, unspecified: Secondary | ICD-10-CM

## 2020-06-06 DIAGNOSIS — F411 Generalized anxiety disorder: Secondary | ICD-10-CM

## 2020-06-06 DIAGNOSIS — M810 Age-related osteoporosis without current pathological fracture: Secondary | ICD-10-CM

## 2020-06-06 DIAGNOSIS — I10 Essential (primary) hypertension: Secondary | ICD-10-CM

## 2020-06-06 MED ORDER — ATORVASTATIN CALCIUM 40 MG PO TABS: 40.0000 mg | ORAL_TABLET | Freq: Every day | ORAL | 0 refills | Status: DC

## 2020-06-06 MED ORDER — BUPROPION HCL ER (XL) 150 MG PO TB24: 450.0000 mg | ORAL_TABLET | Freq: Every day | ORAL | 0 refills | Status: DC

## 2020-06-06 MED ORDER — PANTOPRAZOLE SODIUM 40 MG PO TBEC: 40.0000 mg | DELAYED_RELEASE_TABLET | Freq: Every day | ORAL | 0 refills | Status: DC

## 2020-06-06 MED ORDER — SERTRALINE HCL 100 MG PO TABS: 200.0000 mg | ORAL_TABLET | Freq: Every day | ORAL | 0 refills | Status: DC

## 2020-06-06 MED ORDER — CARBIDOPA-LEVODOPA 25-100 MG PO TABS: 1.0000 | ORAL_TABLET | Freq: Three times a day (TID) | ORAL | 0 refills | Status: DC

## 2020-06-06 MED ORDER — GABAPENTIN 300 MG PO CAPS: 300.0000 mg | ORAL_CAPSULE | Freq: Three times a day (TID) | ORAL | 0 refills | Status: DC

## 2020-06-06 MED ORDER — ALENDRONATE SODIUM 70 MG PO TABS: 70.0000 mg | ORAL_TABLET | ORAL | 0 refills | Status: DC

## 2020-06-06 MED ORDER — ARIPIPRAZOLE 10 MG PO TABS: 10.0000 mg | ORAL_TABLET | Freq: Every day | ORAL | 0 refills | Status: DC

## 2020-06-06 MED ORDER — TIZANIDINE HCL 2 MG PO TABS: 2.0000 mg | ORAL_TABLET | Freq: Two times a day (BID) | ORAL | 0 refills | Status: DC

## 2020-06-06 MED ORDER — METOPROLOL TARTRATE 25 MG PO TABS: 25.0000 mg | ORAL_TABLET | Freq: Two times a day (BID) | ORAL | 0 refills | Status: DC

## 2020-06-14 ENCOUNTER — Other Ambulatory Visit: Payer: Self-pay

## 2020-06-14 ENCOUNTER — Encounter (HOSPITAL_COMMUNITY): Payer: Self-pay

## 2020-06-14 ENCOUNTER — Emergency Department (HOSPITAL_COMMUNITY)
Admission: EM | Admit: 2020-06-14 | Discharge: 2020-06-18 | Disposition: A | Discharge status: Skilled Nursing Facility | Payer: Medicare Other | Attending: Emergency Medicine | Admitting: Emergency Medicine

## 2020-06-14 DIAGNOSIS — R627 Adult failure to thrive: Secondary | ICD-10-CM | POA: Insufficient documentation

## 2020-06-14 DIAGNOSIS — I10 Essential (primary) hypertension: Secondary | ICD-10-CM | POA: Insufficient documentation

## 2020-06-14 DIAGNOSIS — Z7982 Long term (current) use of aspirin: Secondary | ICD-10-CM | POA: Diagnosis not present

## 2020-06-14 DIAGNOSIS — M545 Low back pain: Secondary | ICD-10-CM | POA: Insufficient documentation

## 2020-06-14 DIAGNOSIS — Z20822 Contact with and (suspected) exposure to covid-19: Secondary | ICD-10-CM | POA: Insufficient documentation

## 2020-06-14 DIAGNOSIS — Z79899 Other long term (current) drug therapy: Secondary | ICD-10-CM | POA: Insufficient documentation

## 2020-06-14 DIAGNOSIS — R531 Weakness: Secondary | ICD-10-CM | POA: Diagnosis not present

## 2020-06-14 DIAGNOSIS — L8990 Pressure ulcer of unspecified site, unspecified stage: Secondary | ICD-10-CM | POA: Insufficient documentation

## 2020-06-14 LAB — COMPREHENSIVE METABOLIC PANEL
ALT: 26 U/L (ref 0–44)
AST: 22 U/L (ref 15–41)
Albumin: 4 g/dL (ref 3.5–5.0)
Alkaline Phosphatase: 85 U/L (ref 38–126)
Anion gap: 13 (ref 5–15)
BUN: 14 mg/dL (ref 8–23)
CO2: 21 mmol/L — ABNORMAL LOW (ref 22–32)
Calcium: 9.2 mg/dL (ref 8.9–10.3)
Chloride: 105 mmol/L (ref 98–111)
Creatinine, Ser: 0.8 mg/dL (ref 0.44–1.00)
GFR calc Af Amer: >60 mL/min (ref >60)
GFR calc non Af Amer: >60 mL/min (ref >60)
Glucose, Bld: 83 mg/dL (ref 70–99)
Potassium: 3.7 mmol/L (ref 3.5–5.1)
Sodium: 139 mmol/L (ref 135–145)
Total Bilirubin: 0.9 mg/dL (ref 0.3–1.2)
Total Protein: 7.2 g/dL (ref 6.5–8.1)

## 2020-06-14 LAB — CBC WITH DIFFERENTIAL/PLATELET
Abs Immature Granulocytes: 0.02 10*3/uL (ref 0.00–0.07)
Basophils Absolute: 0.1 10*3/uL (ref 0.0–0.1)
Basophils Relative: 1 %
Eosinophils Absolute: 0.1 10*3/uL (ref 0.0–0.5)
Eosinophils Relative: 1 %
HCT: 36.2 % (ref 36.0–46.0)
Hemoglobin: 11.4 g/dL — ABNORMAL LOW (ref 12.0–15.0)
Immature Granulocytes: 0 %
Lymphocytes Relative: 18 %
Lymphs Abs: 1.6 10*3/uL (ref 0.7–4.0)
MCH: 28.3 pg (ref 26.0–34.0)
MCHC: 31.5 g/dL (ref 30.0–36.0)
MCV: 89.8 fL (ref 80.0–100.0)
Monocytes Absolute: 0.7 10*3/uL (ref 0.1–1.0)
Monocytes Relative: 8 %
Neutro Abs: 6.3 10*3/uL (ref 1.7–7.7)
Neutrophils Relative %: 72 %
Platelets: 393 10*3/uL (ref 150–400)
RBC: 4.03 MIL/uL (ref 3.87–5.11)
RDW: 13.4 % (ref 11.5–15.5)
WBC: 8.7 10*3/uL (ref 4.0–10.5)
nRBC: 0 % (ref 0.0–0.2)

## 2020-06-14 LAB — SARS CORONAVIRUS 2 BY RT PCR (HOSPITAL ORDER, PERFORMED IN ~~LOC~~ HOSPITAL LAB): SARS Coronavirus 2: NEGATIVE

## 2020-06-14 MED ORDER — ATORVASTATIN CALCIUM 40 MG PO TABS
40.0000 mg | ORAL_TABLET | Freq: Every day | ORAL | Status: DC
  Administered 2020-06-15 – 2020-06-18 (×4): 40 mg via ORAL
  Filled 2020-06-14 (×4): qty 1

## 2020-06-14 MED ORDER — BUPROPION HCL ER (XL) 150 MG PO TB24
450.0000 mg | ORAL_TABLET | Freq: Every day | ORAL | Status: DC
  Administered 2020-06-15 – 2020-06-18 (×4): 450 mg via ORAL
  Filled 2020-06-14 (×4): qty 3

## 2020-06-14 MED ORDER — METOPROLOL TARTRATE 25 MG PO TABS
25.0000 mg | ORAL_TABLET | Freq: Two times a day (BID) | ORAL | Status: DC
  Administered 2020-06-15 – 2020-06-18 (×6): 25 mg via ORAL
  Filled 2020-06-14 (×9): qty 1

## 2020-06-14 MED ORDER — LACTATED RINGERS IV BOLUS
1000.0000 mL | Freq: Once | INTRAVENOUS | Status: AC
  Administered 2020-06-14: 1000 mL via INTRAVENOUS

## 2020-06-14 MED ORDER — TRAZODONE HCL 50 MG PO TABS
50.0000 mg | ORAL_TABLET | Freq: Every day | ORAL | Status: DC
  Administered 2020-06-15 – 2020-06-17 (×4): 50 mg via ORAL
  Filled 2020-06-14 (×4): qty 1

## 2020-06-14 MED ORDER — ADULT MULTIVITAMIN W/MINERALS CH
1.0000 | ORAL_TABLET | Freq: Every day | ORAL | Status: DC
  Administered 2020-06-15 – 2020-06-18 (×4): 1 via ORAL
  Filled 2020-06-14 (×4): qty 1

## 2020-06-14 MED ORDER — ARIPIPRAZOLE 10 MG PO TABS
10.0000 mg | ORAL_TABLET | Freq: Every day | ORAL | Status: DC
  Administered 2020-06-15 – 2020-06-18 (×4): 10 mg via ORAL
  Filled 2020-06-14 (×4): qty 1

## 2020-06-14 MED ORDER — ACETAMINOPHEN 325 MG PO TABS
650.0000 mg | ORAL_TABLET | Freq: Once | ORAL | Status: AC
  Administered 2020-06-14: 650 mg via ORAL
  Filled 2020-06-14: qty 2

## 2020-06-14 MED ORDER — SERTRALINE HCL 50 MG PO TABS
200.0000 mg | ORAL_TABLET | Freq: Every day | ORAL | Status: DC
  Administered 2020-06-15 – 2020-06-18 (×4): 200 mg via ORAL
  Filled 2020-06-14 (×3): qty 4

## 2020-06-14 MED ORDER — ASPIRIN 81 MG PO CHEW
81.0000 mg | CHEWABLE_TABLET | Freq: Every day | ORAL | Status: DC
  Administered 2020-06-15 – 2020-06-18 (×4): 81 mg via ORAL
  Filled 2020-06-14 (×4): qty 1

## 2020-06-14 MED ORDER — PANTOPRAZOLE SODIUM 40 MG PO TBEC
40.0000 mg | DELAYED_RELEASE_TABLET | Freq: Every day | ORAL | Status: DC
  Administered 2020-06-15 – 2020-06-18 (×4): 40 mg via ORAL
  Filled 2020-06-14 (×4): qty 1

## 2020-06-14 MED ORDER — VITAMIN D3 25 MCG (1000 UNIT) PO TABS
2000.0000 [IU] | ORAL_TABLET | Freq: Every day | ORAL | Status: DC
  Administered 2020-06-15 – 2020-06-18 (×4): 2000 [IU] via ORAL
  Filled 2020-06-14 (×7): qty 2

## 2020-06-14 MED ORDER — CARBIDOPA-LEVODOPA 25-100 MG PO TABS
1.0000 | ORAL_TABLET | Freq: Three times a day (TID) | ORAL | Status: DC
  Administered 2020-06-15 – 2020-06-18 (×10): 1 via ORAL
  Filled 2020-06-14 (×16): qty 1

## 2020-06-14 MED ORDER — GABAPENTIN 300 MG PO CAPS
300.0000 mg | ORAL_CAPSULE | Freq: Three times a day (TID) | ORAL | Status: DC
  Administered 2020-06-15 – 2020-06-18 (×12): 300 mg via ORAL
  Filled 2020-06-14 (×12): qty 1

## 2020-06-14 MED ORDER — TIZANIDINE HCL 4 MG PO TABS
2.0000 mg | ORAL_TABLET | Freq: Two times a day (BID) | ORAL | Status: DC
  Administered 2020-06-15 – 2020-06-18 (×8): 2 mg via ORAL
  Filled 2020-06-14 (×8): qty 1

## 2020-06-14 MED ORDER — MELATONIN 5 MG PO TABS
5.0000 mg | ORAL_TABLET | Freq: Every day | ORAL | Status: DC
  Administered 2020-06-15 – 2020-06-17 (×4): 5 mg via ORAL
  Filled 2020-06-14 (×4): qty 1

## 2020-06-14 NOTE — ED Notes (Signed)
Per previous NT for this pt, bladder scan showed only 30mL urine. Pt does have a purewick in place. Will continue to monitor for urine sample.

## 2020-06-14 NOTE — Progress Notes (Addendum)
Consult request has been received. CSW attempting to follow up at present time.  CSW updated by the EDP pt recently left a SNF, went home where she lives with family, and returned to [facility] after her family went to [place] and left her alone.  Per chart review pt went to SNF's from [facility] on 5/2 and 7/14.  CSW reviewed chart and sees that on 7/14 when pt returned to a SNF a second time from [facility] she was, "already in co-pay days" meaning that if pt returns to SNF then if she has more days she will have to pay a co-pay every day she is in a SNF going forward in order to return and IF [facility] authorizes more days at a SNF.  11:47 PM CSW met with pt who was agreeable to placement regardless of where it is found but prefers [facility] where she has been x2.  Pt agreeable to paying co-pay days (estimated at $150 or more by the CSW).  CSW spoke with pt and confirmed pt's plan to be discharged to SNF to for rehab at discharge.  CSW provided active listening and validated pt's concerns she prefers [facility].   CSW was given permission to complete FL-2 and send referrals out to SNF facilities via the hub, per pt's request.  Pt has been living with her family prior to being admitted to [facility] ED, but her family isn [place] and left the pt home alone.  CSW attempted to update pt's son Fransisco Beau at [phone] and left a HIPPA-compliant VM requesting a call back.  CSW will continue to follow for D/C needs.  Alphonse Guild. Asani Mcburney  MSW, LCSW, LCAS, CCS Transitions of Care Clinical Social Worker Care Coordination Department Ph: [phone]

## 2020-06-14 NOTE — ED Provider Notes (Signed)
[facility]-EMERGENCY DEPT Provider Note   CSN: [id number] Arrival date & time: 06/14/20  1541     History No chief complaint on file.   Dana [name] is a 74 y.o. female.  HPI Presents because she is having buttock pain and not managing well at home.  Recently discharged from nursing home.  Had been at [facility] due to weakness after dehydration.  Also had hip fracture prior to that.  States since Sunday she has been at her house alone since the family members with her left to go to [place].  States she is not able to get up and dress herself.  States she has trouble getting out of bed.  States she has been able to get make it to the freezer at times and microwave some food.  States she is weak all over.  Patient has had her Covid vaccine.    Past Medical History:  Diagnosis Date  . Anemia    none since hysterectomy  . Arthritis    LlTHA 11'15(had a fall)-"remains tender"  . Depression   . Heart murmur    history of  . Hyperlipidemia   . Hypertension   . Parkinson's disease (HCC)   . Stroke [facility])    4-5 yrs ago(left sided weakness) -no residual  . Transfusion history    age 83- "hysterectomy"    Patient Active Problem List   Diagnosis Date Noted  . Pressure injury of skin 06/14/2020  . Acute metabolic encephalopathy 05/20/2020  . Confusion 05/19/2020  . Generalized weakness 05/19/2020  . Peri-prosthetic fracture around prosthetic hip   . Fall 03/27/2020  . Failure to thrive in adult 11/23/2019  . Fall at home 11/23/2019  . Parkinsonian features 11/23/2019  . Hoarding behavior 11/23/2019  . Polyneuropathy 10/04/2019  . Insomnia 09/07/2018  . Major depressive disorder 07/25/2018  . GAD (generalized anxiety disorder) 07/25/2018  . Hip fracture, left (HCC) 09/15/2014  . Chronic back pain 09/15/2014  . History of stroke   . Hyperlipidemia   . Essential hypertension   . Spastic hemiplegia affecting nondominant side (HCC) 01/12/2012    Past  Surgical History:  Procedure Laterality Date  . ABDOMINAL HYSTERECTOMY    . COLONOSCOPY WITH PROPOFOL N/A 12/25/2014   Procedure: COLONOSCOPY WITH PROPOFOL;  Surgeon: Charolett Bumpers, MD;  Location: [facility] ENDOSCOPY;  Service: Endoscopy;  Laterality: N/A;  . FRACTURE SURGERY  01/22/12   fx of eye socket from fall-"retained plate"  . HAND SURGERY Left    nerve release -many yrs ago  . HIP ARTHROPLASTY Left 09/16/2014   Procedure: ARTHROPLASTY BIPOLAR HIP;  Surgeon: Sheral Apley, MD;  Location: [facility] OR;  Service: Orthopedics;  Laterality: Left;     OB History   No obstetric history on file.     Family History  Problem Relation Age of Onset  . Hyperlipidemia Mother   . Hypertension Mother   . Diabetes Mother   . Heart disease Mother        aortic vavle  replaced  . Hypertension Father   . Hyperlipidemia Father   . Heart disease Father        aortic valve replaced  . Alzheimer's disease Father     Social History   Tobacco Use  . Smoking status: Never Smoker  . Smokeless tobacco: Never Used  Vaping Use  . Vaping Use: Never used  Substance Use Topics  . Alcohol use: Yes    Comment: occasional beer  . Drug use: Never  Home Medications Prior to Admission medications   Medication Sig Start Date End Date Taking? Authorizing Provider  acetaminophen (TYLENOL) 500 MG tablet Take 2 tablets (1,000 mg total) by mouth every 8 (eight) hours as needed. 04/02/20  Yes Zigmund Daniel., MD  alendronate (FOSAMAX) 70 MG tablet Take 1 tablet (70 mg total) by mouth once a week. Take with 8 oz of water 30 minutes prior to food/meds. Do not lie down for 30 minutes. 06/06/20  Yes Ngetich, Dinah C, NP  ARIPiprazole (ABILIFY) 10 MG tablet Take 1 tablet (10 mg total) by mouth daily. 06/06/20  Yes Ngetich, Dinah C, NP  aspirin 81 MG chewable tablet Chew 81 mg by mouth daily.   Yes [provider]  atorvastatin (LIPITOR) 40 MG tablet Take 1 tablet (40 mg total) by mouth daily. 06/06/20   Yes Ngetich, Dinah C, NP  buPROPion (WELLBUTRIN XL) 150 MG 24 hr tablet Take 3 tablets (450 mg total) by mouth daily. 06/06/20  Yes Ngetich, Dinah C, NP  carbidopa-levodopa (SINEMET IR) 25-100 MG tablet Take 1 tablet by mouth 3 (three) times daily. 06/06/20  Yes Ngetich, Dinah C, NP  Cholecalciferol (VITAMIN D) 50 MCG (2000 UT) CAPS Take 2,000 Units by mouth daily.    Yes [provider]  gabapentin (NEURONTIN) 300 MG capsule Take 1 capsule (300 mg total) by mouth 3 (three) times daily. 06/06/20  Yes Ngetich, Dinah C, NP  lactose free nutrition (BOOST) LIQD Take 237 mLs by mouth 2 (two) times daily between meals. 04/03/20  Yes [provider]  melatonin 5 MG TABS Take 5 mg by mouth at bedtime.    Yes [provider]  metoprolol tartrate (LOPRESSOR) 25 MG tablet Take 1 tablet (25 mg total) by mouth 2 (two) times daily. 06/06/20  Yes Ngetich, Dinah C, NP  Multiple Vitamin (MULTIVITAMIN) tablet Take 1 tablet by mouth daily.   Yes [provider]  pantoprazole (PROTONIX) 40 MG tablet Take 1 tablet (40 mg total) by mouth daily. 06/06/20  Yes Ngetich, Dinah C, NP  sertraline (ZOLOFT) 100 MG tablet Take 2 tablets (200 mg total) by mouth daily. 06/06/20  Yes Ngetich, Dinah C, NP  tiZANidine (ZANAFLEX) 2 MG tablet Take 1 tablet (2 mg total) by mouth 2 (two) times daily. QAM and QHS 06/06/20  Yes Ngetich, Dinah C, NP  traZODone (DESYREL) 50 MG tablet Take 50 mg by mouth at bedtime.  06/09/20  Yes [provider]    Allergies    Percodan [oxycodone-aspirin] and Oxycodone  Review of Systems   Review of Systems  Constitutional: Negative for appetite change.  HENT: Negative for congestion.   Respiratory: Negative for shortness of breath.   Cardiovascular: Negative for chest pain.  Musculoskeletal: Positive for back pain.  Skin: Positive for wound.  Neurological: Positive for weakness.  Psychiatric/Behavioral: Negative for confusion.    Physical Exam Updated Vital  Signs BP 136/70   Pulse 84   Temp 98.2 F (36.8 C) (Oral)   Resp 18   Ht 5\' 3"  (1.6 m)   Wt 51.3 kg   SpO2 100%   BMI 20.02 kg/m   Physical Exam Vitals reviewed.  HENT:     Head: Normocephalic.  Cardiovascular:     Rate and Rhythm: Regular rhythm.  Pulmonary:     Breath sounds: No wheezing, rhonchi or rales.  Abdominal:     Tenderness: There is no abdominal tenderness.  Genitourinary:    Comments: Mild sacral and perianal decubitus ulcer no  abscess seen Musculoskeletal:        General: No tenderness.     Cervical back: Neck supple.  Skin:    Capillary Refill: Capillary refill takes less than 2 seconds.  Neurological:     Mental Status: She is alert and oriented to person, place, and time.     ED Results / Procedures / Treatments   Labs (all labs ordered are listed, but only abnormal results are displayed) Labs Reviewed  COMPREHENSIVE METABOLIC PANEL - Abnormal; Notable for the following components:      Result Value   CO2 21 (*)    All other components within normal limits  CBC WITH DIFFERENTIAL/PLATELET - Abnormal; Notable for the following components:   Hemoglobin 11.4 (*)    All other components within normal limits  SARS CORONAVIRUS 2 BY RT PCR (HOSPITAL ORDER, PERFORMED IN [facility])  URINALYSIS, ROUTINE W REFLEX MICROSCOPIC    EKG None  Radiology No results found.  Procedures Procedures (including critical care time)  Medications Ordered in ED Medications - No data to display  ED Course  I have reviewed the triage vital signs and the nursing notes.  Pertinent labs & imaging results that were available during my care of the patient were reviewed by me and considered in my medical decision making (see chart for details).    MDM Rules/Calculators/A&P                          Patient presents from home.  Reportedly recently got a nursing home as now home alone since family is in [place].  Medically cleared although urinalysis is  still pending.  Have discussed with transitions of care who will see patient. Final Clinical Impression(s) / ED Diagnoses Final diagnoses:  Failure to thrive in adult    Rx / DC Orders ED Discharge Orders    None       Benjiman Core, MD 06/14/20 2311

## 2020-06-14 NOTE — ED Triage Notes (Signed)
Patient states that her family went on vacation to [place] and left her alone at her home. Patient is incontinent of stool. Patient's has excoriated buttocks. Patient states she is unable to dress herself.

## 2020-06-14 NOTE — TOC Initial Note (Signed)
Transition of Care [facility]) - Initial/Assessment Note    Patient Details  Name: Dana [name] MRN: [id number] Date of Birth: [birth year]  Transition of Care [facility]) CM/SW Contact:    Claudine Mouton, LCSW Phone Number: 06/14/2020, 11:57 PM  Clinical Narrative:    Consult request has been received. CSW attempting to follow up at present time.  CSW updated by the EDP pt recently left a SNF, went home where she lives with family, and returned to [facility] after her family went to [place] and left her alone.  Per chart review pt went to SNF's from [facility] on 5/2 and 7/14.  CSW reviewed chart and sees that on 7/14 when pt returned to a SNF a second time from [facility] she was, "already in co-pay days" meaning that if pt returns to SNF then if she has more days she will have to pay a co-pay every day she is in a SNF going forward in order to return and IF [facility] authorizes more days at a SNF.  11:47 PM CSW met with pt who was agreeable to placement regardless of where it is found but prefers [facility] where she has been x2.  Pt agreeable to paying co-pay days (estimated at $150 or more by the CSW).  CSW spoke with pt and confirmed pt's plan to be discharged to SNF to for rehab at discharge.  CSW provided active listening and validated pt's concerns she prefers [facility].   CSW was given permission to complete FL-2 and send referrals out to SNF facilities via the hub, per pt's request.  Pt has been living with her family prior to being admitted to [facility] ED, but her family isn [place] and left the pt home alone.  CSW attempted to update pt's son Fransisco Beau at [phone] and left a HIPPA-compliant VM requesting a call back.               Expected Discharge Plan: Skilled Nursing Facility Barriers to Discharge: Insurance Authorization   Patient Goals and CMS Choice Patient states their goals for this hospitalization and ongoing recovery are:: To regain strength and return home safely.      Expected  Discharge Plan and Services Expected Discharge Plan: [place] arrangements for the past 2 months: Single Family Home (Lives with family)                                      Prior Living Arrangements/Services Living arrangements for the past 2 months: Single Family Home (Lives with family) Lives with:: Relatives, Adult Children   Do you feel safe going back to the place where you live?: No      Need for Family Participation in Patient Care: No (Comment) Care giver support system in place?: No (comment)      Activities of Daily Living      Permission Sought/Granted Permission sought to share information with : Chartered certified accountant granted to share information with : Yes, Verbal Permission Granted              Emotional Assessment     Affect (typically observed): Calm, Appropriate, Accepting, Adaptable Orientation: : Oriented to Self, Oriented to Place, Oriented to  Time, Oriented to Situation      Admission diagnosis:  bed sores Patient Active Problem List   Diagnosis Date Noted  . Pressure injury of  skin 06/14/2020  . Acute metabolic encephalopathy 18/55/0158  . Confusion 05/19/2020  . Generalized weakness 05/19/2020  . Peri-prosthetic fracture around prosthetic hip   . Fall 03/27/2020  . Failure to thrive in adult 11/23/2019  . Fall at home 11/23/2019  . Parkinsonian features 11/23/2019  . Hoarding behavior 11/23/2019  . Polyneuropathy 10/04/2019  . Insomnia 09/07/2018  . Major depressive disorder 07/25/2018  . GAD (generalized anxiety disorder) 07/25/2018  . Hip fracture, left ([place]) 09/15/2014  . Chronic back pain 09/15/2014  . History of stroke   . Hyperlipidemia   . Essential hypertension   . Spastic hemiplegia affecting nondominant side ([place]) 01/12/2012   PCP:  Leighton Ruff, MD Pharmacy:   [id number] - [place], [place] - [address] AT [place] [address] [place] [address] Phone: [phone] Fax: [phone]  [facility], [place], [address] [place], [place] [address] [place] [address] Phone: [phone] Fax: [facility] [address] [place] [address] [place] [address] Phone: [phone] Fax: [phone]     Social Determinants of Health (SDOH) Interventions    Readmission Risk Interventions Readmission Risk Prevention Plan 05/21/2020  Home Care Screening Complete  Some recent data might be hidden

## 2020-06-14 NOTE — ED Triage Notes (Signed)
Arrived by EMS from home. Patient  reports pain in buttock secondary to "bed sores".

## 2020-06-15 LAB — URINALYSIS, ROUTINE W REFLEX MICROSCOPIC
Bacteria, UA: NONE SEEN
Bilirubin Urine: NEGATIVE
Glucose, UA: NEGATIVE mg/dL
Ketones, ur: 80 mg/dL — AB
Nitrite: NEGATIVE
Protein, ur: 30 mg/dL — AB
Specific Gravity, Urine: 1.021 (ref 1.005–1.030)
pH: 5 (ref 5.0–8.0)

## 2020-06-15 NOTE — Progress Notes (Signed)
Assessment completed - FL-2/Referrals sent out via the hub, pt prefers [facility].  PT consult ordered/Covid test is negative.  CSW will continue to follow for D/C needs.  Dorothe Pea. Malaquias Lenker  MSW, LCSW, LCAS, CCS Transitions of Care Clinical Social Worker Care Coordination Department Ph: [phone]

## 2020-06-15 NOTE — ED Notes (Signed)
Assumed care of patient at this time, nad noted, sr up x2, bed locked and low, call bell w/I reach.  Will continue to monitor. ° °

## 2020-06-15 NOTE — ED Provider Notes (Signed)
Patient left at change of shift to check her urinalysis while waiting for placement.  Urinalysis    Component Value Date/Time   COLORURINE YELLOW 06/15/2020 0019   APPEARANCEUR CLEAR 06/15/2020 0019   LABSPEC 1.021 06/15/2020 0019   PHURINE 5.0 06/15/2020 0019   GLUCOSEU NEGATIVE 06/15/2020 0019   HGBUR MODERATE (A) 06/15/2020 0019   BILIRUBINUR NEGATIVE 06/15/2020 0019   KETONESUR 80 (A) 06/15/2020 0019   PROTEINUR 30 (A) 06/15/2020 0019   UROBILINOGEN 0.2 03/11/2011 1828   NITRITE NEGATIVE 06/15/2020 0019   LEUKOCYTESUR LARGE (A) 06/15/2020 0019   Microscopic shows 0-5 RBCs, 0-5 WBCs, no bacteria and 0-5 squamous cells and no white blood cell clumps.  Urine culture was sent and patient was not treated for UTI.   Devoria Albe, MD 06/15/20 0100

## 2020-06-15 NOTE — ED Notes (Signed)
Pt. Was changed, peri care was performed and barrier cream applied to bottom.

## 2020-06-15 NOTE — TOC Progression Note (Addendum)
Transition of Care [facility]) - Progression Note    Patient Details  Name: Eren Puebla MRN: [id number] Date of Birth: Jun 03, 1946  Transition of Care [facility]) CM/SW Contact  Joanne Chars, LCSW Phone Number: 06/15/2020, 11:48 AM  Clinical Narrative:   CSW received call from [place] [facility] who can offer bed for pt.  She needs insurance auth prior to pt transferring.  PT has still not evaluated pt-called on call PT pager, was told that they do have order and will see pt today.    1330: CSW met with pt and she would like to go to [facility].  Choice offered and explained.  PT did complete eval recommending SNF.  Auth faxed to [facility] at [place]: TC call with [employer], pt son, [phone].  He clarified that pt does not live with them, they live in [place] but they are on vacation in [place].  Pt lives alone, has help in the home every morning for 4 hours.  Pt has been at [facility] for rehab multiple times and this has become a "cycle" where she bounces from home to [facility] to rehab.  Pt has not qualified for medicaid for possible custodial care and pt has not wanted long term care either.  Son is somewhat frustrated as there does not seem to be a good option.  CSW suggested contact with elder law to get a better idea of long term options. Son would like updates.    Expected Discharge Plan: Skilled Nursing Facility Barriers to Discharge: Insurance Authorization  Expected Discharge Plan and Services Expected Discharge Plan: [place]       Living arrangements for the past 2 months: Single Family Home (Lives with family)                                       Social Determinants of Health (SDOH) Interventions    Readmission Risk Interventions Readmission Risk Prevention Plan 05/21/2020  Home Care Screening Complete  Some recent data might be hidden

## 2020-06-15 NOTE — NC FL2 (Addendum)
[place] MEDICAID FL2 LEVEL OF CARE SCREENING TOOL     IDENTIFICATION  Patient Name: Dana [name] Birthdate: 1946-07-14 Sex: female Admission Date (Current Location): 06/14/2020  [facility] and [place] Number:  Producer, television/film/video and Address:  [facility],  [address], [place] [address]      Provider Number: [id number]  Attending Physician Name and Address:  Devoria Albe, MD  Relative Name and Phone Number:       Current Level of Care: Hospital Recommended Level of Care: Skilled Nursing Facility Prior Approval Number:    Date Approved/Denied:   PASRR Number: [id number]  Discharge Plan: SNF    Current Diagnoses: Patient Active Problem List   Diagnosis Date Noted  . Pressure injury of skin 06/14/2020  . Acute metabolic encephalopathy 05/20/2020  . Confusion 05/19/2020  . Generalized weakness 05/19/2020  . Peri-prosthetic fracture around prosthetic hip   . Fall 03/27/2020  . Failure to thrive in adult 11/23/2019  . Fall at home 11/23/2019  . Parkinsonian features 11/23/2019  . Hoarding behavior 11/23/2019  . Polyneuropathy 10/04/2019  . Insomnia 09/07/2018  . Major depressive disorder 07/25/2018  . GAD (generalized anxiety disorder) 07/25/2018  . Hip fracture, left (HCC) 09/15/2014  . Chronic back pain 09/15/2014  . History of stroke   . Hyperlipidemia   . Essential hypertension   . Spastic hemiplegia affecting nondominant side (HCC) 01/12/2012    Orientation RESPIRATION BLADDER Height & Weight     Self, Time, Situation, Place  Normal Continent Weight: 113 lb (51.3 kg) Height:  5\' 3"  (160 cm)  BEHAVIORAL SYMPTOMS/MOOD NEUROLOGICAL BOWEL NUTRITION STATUS      Continent Diet (Regular)  AMBULATORY STATUS COMMUNICATION OF NEEDS Skin   Extensive Assist Verbally Normal, Other (Comment) (Stage 1 Pressure wound on sacram)                       Personal Care Assistance Level of Assistance  Bathing, Dressing Bathing Assistance: Limited  assistance   Dressing Assistance: Limited assistance     Functional Limitations Info             SPECIAL CARE FACTORS FREQUENCY  PT (By licensed PT), OT (By licensed OT)     PT Frequency: 5 OT Frequency: 5            Contractures Contractures Info: Not present    Additional Factors Info  Code Status, Allergies Code Status Info: DNR Allergies Info: Percodan (Oxycodone-aspirin), Oxycodone           Current Medications (06/15/2020):  This is the current hospital active medication list Current Facility-Administered Medications  Medication Dose Route Frequency Provider Last Rate Last Admin  . ARIPiprazole (ABILIFY) tablet 10 mg  10 mg Oral Daily 08/15/2020, MD      . aspirin chewable tablet 81 mg  81 mg Oral Daily Benjiman Core, MD      . atorvastatin (LIPITOR) tablet 40 mg  40 mg Oral Daily Benjiman Core, MD      . buPROPion (WELLBUTRIN XL) 24 hr tablet 450 mg  450 mg Oral Daily Benjiman Core, MD      . carbidopa-levodopa (SINEMET IR) 25-100 MG per tablet immediate release 1 tablet  1 tablet Oral TID Benjiman Core, MD      . cholecalciferol (VITAMIN D) tablet 2,000 Units  2,000 Units Oral Daily Benjiman Core, MD      . gabapentin (NEURONTIN) capsule 300 mg  300 mg Oral TID Benjiman Core, MD      .  melatonin tablet 5 mg  5 mg Oral QHS Benjiman Core, MD      . metoprolol tartrate (LOPRESSOR) tablet 25 mg  25 mg Oral BID Benjiman Core, MD      . multivitamin with minerals tablet 1 tablet  1 tablet Oral Daily Benjiman Core, MD      . pantoprazole (PROTONIX) EC tablet 40 mg  40 mg Oral Daily Benjiman Core, MD      . sertraline (ZOLOFT) tablet 200 mg  200 mg Oral Daily Benjiman Core, MD      . tiZANidine (ZANAFLEX) tablet 2 mg  2 mg Oral BID Benjiman Core, MD      . traZODone (DESYREL) tablet 50 mg  50 mg Oral Renee Harder, MD       Current Outpatient Medications  Medication Sig Dispense Refill  . acetaminophen  (TYLENOL) 500 MG tablet Take 2 tablets (1,000 mg total) by mouth every 8 (eight) hours as needed. 30 tablet 0  . alendronate (FOSAMAX) 70 MG tablet Take 1 tablet (70 mg total) by mouth once a week. Take with 8 oz of water 30 minutes prior to food/meds. Do not lie down for 30 minutes. 4 tablet 0  . ARIPiprazole (ABILIFY) 10 MG tablet Take 1 tablet (10 mg total) by mouth daily. 30 tablet 0  . aspirin 81 MG chewable tablet Chew 81 mg by mouth daily.    Marland Kitchen atorvastatin (LIPITOR) 40 MG tablet Take 1 tablet (40 mg total) by mouth daily. 30 tablet 0  . buPROPion (WELLBUTRIN XL) 150 MG 24 hr tablet Take 3 tablets (450 mg total) by mouth daily. 90 tablet 0  . carbidopa-levodopa (SINEMET IR) 25-100 MG tablet Take 1 tablet by mouth 3 (three) times daily. 90 tablet 0  . Cholecalciferol (VITAMIN D) 50 MCG (2000 UT) CAPS Take 2,000 Units by mouth daily.     Marland Kitchen gabapentin (NEURONTIN) 300 MG capsule Take 1 capsule (300 mg total) by mouth 3 (three) times daily. 90 capsule 0  . lactose free nutrition (BOOST) LIQD Take 237 mLs by mouth 2 (two) times daily between meals.    . melatonin 5 MG TABS Take 5 mg by mouth at bedtime.     . metoprolol tartrate (LOPRESSOR) 25 MG tablet Take 1 tablet (25 mg total) by mouth 2 (two) times daily. 60 tablet 0  . Multiple Vitamin (MULTIVITAMIN) tablet Take 1 tablet by mouth daily.    . pantoprazole (PROTONIX) 40 MG tablet Take 1 tablet (40 mg total) by mouth daily. 30 tablet 0  . sertraline (ZOLOFT) 100 MG tablet Take 2 tablets (200 mg total) by mouth daily. 60 tablet 0  . tiZANidine (ZANAFLEX) 2 MG tablet Take 1 tablet (2 mg total) by mouth 2 (two) times daily. QAM and QHS 60 tablet 0  . traZODone (DESYREL) 50 MG tablet Take 50 mg by mouth at bedtime.        Discharge Medications: Please see discharge summary for a list of discharge medications.  Relevant Imaging Results:  Relevant Lab Results:   Additional Information [id number]  Dorothe Pea Lounette Sloan, LCSW

## 2020-06-15 NOTE — Evaluation (Signed)
Physical Therapy Evaluation Patient Details Name: Dana [name] MRN: [id number] DOB: 10/28/1946 Today's Date: 06/15/2020   History of Present Illness  74 yo female presented to ED with inability to care for herself. Recent hx of C1 spinal fx and L greater trochanter fx 03/2020-both with nonop management (reports no further cervical collar per her f/u visit) Hx of CVA with L side residual weakness, L THA, anemia, OA, neuropathy, chronic pain  Clinical Impression  Pt seen in ED with above diagnosis. Pt currently with functional limitations due to the deficits listed below (see PT Problem List). Pt will benefit from skilled PT to increase their independence and safety with mobility to allow discharge to the venue listed below.  Pt reports decreased mobility and poorly performing ADLs at home prior to ED visit. Pt also reports fall in kitchen trying to remove meal from microwave.  Pt would benefit from SNF upon d/c.      Follow Up Recommendations SNF;Supervision/Assistance - 24 hour    Equipment Recommendations  None recommended by PT    Recommendations for Other Services       Precautions / Restrictions Precautions Precautions: Fall Precaution Comments: incontinent      Mobility  Bed Mobility Overal bed mobility: Needs Assistance Bed Mobility: Supine to Sit     Supine to sit: Min assist     General bed mobility comments: assist for trunk per pt request due to hx of cervical fx  Transfers Overall transfer level: Needs assistance Equipment used: Rolling walker (2 wheeled) Transfers: Sit to/from Stand Sit to Stand: Min assist         General transfer comment: assist to rise and steady  Ambulation/Gait Ambulation/Gait assistance: Min assist;Min guard Gait Distance (Feet): 80 Feet Assistive device: Rolling walker (2 wheeled) Gait Pattern/deviations: Step-through pattern;Decreased stride length;Narrow base of support     General Gait Details: very narrow BOS, occasional  steady assist initially however improved to min/guard  Stairs            Wheelchair Mobility    Modified Rankin (Stroke Patients Only)       Balance Overall balance assessment: History of Falls                                           Pertinent Vitals/Pain Pain Assessment: Faces Faces Pain Scale: Hurts little more Pain Location: during pericare from nursing Pain Descriptors / Indicators: Sore Pain Intervention(s): Repositioned;Monitored during session    Home Living Family/patient expects to be discharged to:: Private residence Living Arrangements: Alone Available Help at Discharge: Family;Available PRN/intermittently Type of Home: House Home Access: Stairs to enter   Entrance Stairs-Number of Steps: 1 Home Layout: One level Home Equipment: Walker - 2 wheels;Cane - single point;Bedside commode;Walker - 4 wheels      Prior Function Level of Independence: Independent with assistive device(s)         Comments: uses RW, recently at SNF and discharged home, reports she wears depends at baseline; reports she typically does not need assist for ADLs however appears to have MASD sacral/buttocks area     Hand Dominance        Extremity/Trunk Assessment        Lower Extremity Assessment Lower Extremity Assessment: Generalized weakness    Cervical / Trunk Assessment Cervical / Trunk Assessment: Normal  Communication   Communication: No difficulties  Cognition Arousal/Alertness: Awake/alert Behavior During  Therapy: WFL for tasks assessed/performed Overall Cognitive Status: Within Functional Limits for tasks assessed                                        General Comments General comments (skin integrity, edema, etc.): MASD in periarea (nursing changing pt upon arrival to room), pt reports wearing depends at home    Exercises     Assessment/Plan    PT Assessment Patient needs continued PT services  PT Problem List  Decreased strength;Decreased activity tolerance;Decreased balance;Decreased knowledge of use of DME;Decreased mobility       PT Treatment Interventions DME instruction;Gait training;Balance training;Therapeutic exercise;Functional mobility training;Therapeutic activities;Patient/family education    PT Goals (Current goals can be found in the Care Plan section)  Acute Rehab PT Goals PT Goal Formulation: With patient Time For Goal Achievement: 06/29/20 Potential to Achieve Goals: Good    Frequency Min 2X/week   Barriers to discharge        Co-evaluation               AM-PAC PT "6 Clicks" Mobility  Outcome Measure Help needed turning from your back to your side while in a flat bed without using bedrails?: A Little Help needed moving from lying on your back to sitting on the side of a flat bed without using bedrails?: A Little Help needed moving to and from a bed to a chair (including a wheelchair)?: A Little Help needed standing up from a chair using your arms (e.g., wheelchair or bedside chair)?: A Little Help needed to walk in hospital room?: A Little Help needed climbing 3-5 steps with a railing? : A Little 6 Click Score: 18    End of Session Equipment Utilized During Treatment: Gait belt Activity Tolerance: Patient tolerated treatment well Patient left: in bed;with call bell/phone within reach (sitting edge of stretcher for lunch, RN aware) Nurse Communication: Mobility status PT Visit Diagnosis: Other abnormalities of gait and mobility (R26.89)    Time: 0086-7619 PT Time Calculation (min) (ACUTE ONLY): 20 min   Charges:   PT Evaluation $PT Eval Low Complexity: 1 Low        Kati PT, DPT Acute Rehabilitation Services Pager: [phone] Office: [phone]  Maida Sale E 06/15/2020, 2:48 PM

## 2020-06-16 LAB — URINE CULTURE

## 2020-06-16 NOTE — Progress Notes (Signed)
CSW checked [employer] website for update about insurance authorization, auth is still pending. CSW met with Pt at bedside to update Pt about auth still pending.  CSW will continue to follow for discharge needs.     MSW LCSWA Transitions of Care  Clinical Social Worker  [facility]  [phone]  

## 2020-06-16 NOTE — Progress Notes (Signed)
CSW met with Pt at bedside to inform Pt that bed offer had been made by [facility], Pt's choice of SNF.  CSW called [facility] and there is no one available in admissions today.  Per Pt's request, CSW called Pt's son to update about bed offer. Brock Legner @[phone] HIPAA compliant voicemail  Insurance authorization still pending at this time.     MSW LCSWA Transitions of Care  Clinical Social Worker  [facility]  [phone]  

## 2020-06-16 NOTE — ED Notes (Signed)
Patient is resting comfortably. 

## 2020-06-17 NOTE — ED Notes (Signed)
Patient has been placed on a hospital bed at this time.

## 2020-06-17 NOTE — ED Notes (Signed)
Patient c/o right sided neck pain. Patient does not have any PRN medications ordered. Patient given hot packs and placed on neck.

## 2020-06-17 NOTE — Social Work (Signed)
TOC CSW contacted [place] for insurance authorization update, Berkley Harvey is still pending.  Reference #:  [id number]  CSW spoke with [facility], [phone] to confirm bed offer. Lowella Bandy stated, "They are just waiting on authorization".  CSW will continue to follow for dc needs.  Swayzie Choate Tarpley-Carter, MSW, LCSW-A                  [facility] ED Transitions of CareClinical Social Worker [email or web address] [phone]

## 2020-06-17 NOTE — ED Notes (Signed)
Assumed care of patient at this time, nad noted, sr up x2, bed locked and low, call bell w/I reach.  Will continue to monitor. ° °

## 2020-06-18 MED ORDER — SODIUM CHLORIDE 0.9 % IV BOLUS
500.0000 mL | Freq: Once | INTRAVENOUS | Status: AC
  Administered 2020-06-18: 500 mL via INTRAVENOUS

## 2020-06-18 NOTE — ED Notes (Signed)
Patient is resting comfortably. 

## 2020-06-18 NOTE — Social Work (Signed)
TOC CSW spoke with [facility] [phone].  Pt is ready for dc, see information below:  Room #:  108  Call Report:  [phone]  CSW will inform RN and MD that pt is ready for dc and transport to [facility].  CSW will continue to follow for dc needs.  Fread Kottke Tarpley-Carter, MSW, LCSW-A                  [facility] ED Transitions of CareClinical Social Worker [email or web address] [phone]

## 2020-06-18 NOTE — Social Work (Signed)
TOC CSW was contacted by [employer] [phone].    Authorization #:  [id number]  Authorization is good for 3 days, starting on 06/18/2020 and ending on 06/20/2020.  Contact information for review is as follows:  Vernona Rieger, fax # [phone].  CSW will contact RN and MD for dc to [facility].  CSW will continue to follow for dc needs  .Karynn Deblasi Tarpley-Carter, MSW, LCSW-A                  [facility] Transitions of Care Clinical Social Worker [email or web address] [phone]

## 2020-06-18 NOTE — ED Notes (Signed)
[facility] called for transport.  

## 2020-06-18 NOTE — ED Notes (Signed)
ED TO INPATIENT HANDOFF REPORT  ED Nurse Name and Phone #: [phone]  S Name/Age/Gender Dana [name] 74 y.o. female Room/Bed: WA21/WA21  Code Status   Code Status: Prior  Home/SNF/Other Home Patient oriented to: self, place, time and situation Is this baseline? Yes   Triage Complete: Triage complete  Chief Complaint bed sores  Triage Note Arrived by EMS from home. Patient  reports pain in buttock secondary to "bed sores".  Patient states that her family went on vacation to [place] and left her alone at her home. Patient is incontinent of stool. Patient's has excoriated buttocks. Patient states she is unable to dress herself.    Allergies Allergies  Allergen Reactions  . Percodan [Oxycodone-Aspirin] Nausea Only  . Oxycodone Nausea Only    Level of Care/Admitting Diagnosis ED Disposition    ED Disposition Condition Comment   Discharge  The patient appears reasonably screened and/or stabilized for discharge and I doubt any other medical condition or other [facility] requiring further screening, evaluation, or treatment in the ED exists or is present at this time prior to discharge.       B Medical/Surgery History Past Medical History:  Diagnosis Date  . Anemia    none since hysterectomy  . Arthritis    LlTHA 11'15(had a fall)-"remains tender"  . Depression   . Heart murmur    history of  . Hyperlipidemia   . Hypertension   . Parkinson's disease (HCC)   . Stroke [facility])    4-5 yrs ago(left sided weakness) -no residual  . Transfusion history    age 45- "hysterectomy"   Past Surgical History:  Procedure Laterality Date  . ABDOMINAL HYSTERECTOMY    . COLONOSCOPY WITH PROPOFOL N/A 12/25/2014   Procedure: COLONOSCOPY WITH PROPOFOL;  Surgeon: Charolett Bumpers, MD;  Location: [facility] ENDOSCOPY;  Service: Endoscopy;  Laterality: N/A;  . FRACTURE SURGERY  01/22/12   fx of eye socket from fall-"retained plate"  . HAND SURGERY Left    nerve release -many yrs ago  . HIP  ARTHROPLASTY Left 09/16/2014   Procedure: ARTHROPLASTY BIPOLAR HIP;  Surgeon: Sheral Apley, MD;  Location: [facility];  Service: Orthopedics;  Laterality: Left;     A IV Location/Drains/Wounds Patient Lines/Drains/Airways Status    Active Line/Drains/Airways    Name Placement date Placement time Site Days   Peripheral IV 05/19/20 Right;Posterior Forearm 05/19/20  2107  Forearm  30   Peripheral IV 06/14/20 Left Forearm 06/14/20  2340  Forearm  4   External Urinary Catheter 05/16/20  0845  --  33   Incision (Closed) 09/16/14 Hip Left 09/16/14  1126   2102   Pressure Injury 06/14/20 Sacrum Medial Stage 1 -  Intact skin with non-blanchable redness of a localized area usually over a bony prominence.  06/14/20  1952   4          Intake/Output Last 24 hours  Intake/Output Summary (Last 24 hours) at 06/18/2020 1501 Last data filed at 06/18/2020 0842 Gross per 24 hour  Intake 505.52 ml  Output --  Net 505.52 ml    Labs/Imaging No results found for this or any previous visit (from the past 48 hour(s)). No results found.  Pending Labs Unresulted Labs (From admission, onward) Comment         None      Vitals/Pain Today's Vitals   06/18/20 0911 06/18/20 0940 06/18/20 1122 06/18/20 1341  BP: (!) 99/58  (!) 108/56 (!) 134/97  Pulse: 75  73 70  Resp: 16  16 18   Temp:      TempSrc:      SpO2: 94%  92% 94%  Weight:      Height:      PainSc:  0-No pain      Isolation Precautions No active isolations  Medications Medications  ARIPiprazole (ABILIFY) tablet 10 mg (10 mg Oral Given 06/18/20 0939)  atorvastatin (LIPITOR) tablet 40 mg (40 mg Oral Given 06/18/20 0939)  aspirin chewable tablet 81 mg (81 mg Oral Given 06/18/20 0939)  buPROPion (WELLBUTRIN XL) 24 hr tablet 450 mg (450 mg Oral Given 06/18/20 0939)  carbidopa-levodopa (SINEMET IR) 25-100 MG per tablet immediate release 1 tablet (1 tablet Oral Given 06/18/20 1150)  cholecalciferol (VITAMIN D) tablet 2,000 Units (2,000 Units  Oral Given 06/18/20 0939)  gabapentin (NEURONTIN) capsule 300 mg (300 mg Oral Given 06/18/20 0940)  melatonin tablet 5 mg (5 mg Oral Given 06/17/20 2338)  metoprolol tartrate (LOPRESSOR) tablet 25 mg (25 mg Oral Given 06/18/20 0940)  sertraline (ZOLOFT) tablet 200 mg (200 mg Oral Given 06/18/20 0939)  pantoprazole (PROTONIX) EC tablet 40 mg (40 mg Oral Given 06/18/20 0939)  multivitamin with minerals tablet 1 tablet (1 tablet Oral Given 06/18/20 0939)  tiZANidine (ZANAFLEX) tablet 2 mg (2 mg Oral Given 06/18/20 0945)  traZODone (DESYREL) tablet 50 mg (50 mg Oral Given 06/17/20 2338)  lactated ringers bolus 1,000 mL (0 mLs Intravenous Stopped 06/15/20 0756)  acetaminophen (TYLENOL) tablet 650 mg (650 mg Oral Given 06/14/20 2336)  sodium chloride 0.9 % bolus 500 mL ( Intravenous Stopped 06/18/20 0824)    Mobility walks with device Low fall risk   Focused Assessments .   R Recommendations: See Admitting Provider Note  Report given to:   Additional Notes: n/a

## 2020-06-20 ENCOUNTER — Ambulatory Visit: Payer: Medicare Other | Admitting: Physician Assistant

## 2020-06-21 ENCOUNTER — Encounter: Payer: Self-pay | Admitting: Internal Medicine

## 2020-06-21 ENCOUNTER — Non-Acute Institutional Stay (SKILLED_NURSING_FACILITY): Payer: Medicare Other | Admitting: Internal Medicine

## 2020-06-21 DIAGNOSIS — G20 Parkinson's disease: Secondary | ICD-10-CM | POA: Diagnosis not present

## 2020-06-21 DIAGNOSIS — G20C Parkinsonism, unspecified: Secondary | ICD-10-CM

## 2020-06-21 DIAGNOSIS — R627 Adult failure to thrive: Secondary | ICD-10-CM

## 2020-06-21 DIAGNOSIS — Z66 Do not resuscitate: Secondary | ICD-10-CM

## 2020-06-21 DIAGNOSIS — R54 Age-related physical debility: Secondary | ICD-10-CM | POA: Diagnosis not present

## 2020-06-21 DIAGNOSIS — F320 Major depressive disorder, single episode, mild: Secondary | ICD-10-CM

## 2020-06-21 DIAGNOSIS — G8110 Spastic hemiplegia affecting unspecified side: Secondary | ICD-10-CM

## 2020-06-21 NOTE — Progress Notes (Signed)
Provider:  Gwenith Spitz. Renato Gails, D.O., C.M.D. [facility] Room Number: 108 Place of Service:  SNF (31)  PCP: Juluis Rainier, MD Patient Care Team: Juluis Rainier, MD as PCP - General [facility])  Extended Emergency Contact Information Primary Emergency Contact: saje, gallop          [place], [place] [address] [place] Phone: [phone] Relation: Son Secondary Emergency Contact: Annia Friendly Address: [address]          [place], [place] [address] [place] Home Phone: [phone] Relation: Friend  Code Status: DNR Goals of Care: Advanced Directive information Advanced Directives 06/21/2020  Does Patient Have a Medical Advance Directive? Yes  Type of Advance Directive Out of facility DNR (pink MOST or yellow form)  Does patient want to make changes to medical advance directive? No - Patient declined  Copy of Healthcare Power of Attorney in Chart? -  Pre-existing out of facility DNR order (yellow form or pink MOST form) Pink MOST/Yellow Form most recent copy in chart - Physician notified to receive inpatient order   Chief Complaint  Patient presents with  . New Admit To SNF    Admit to SNF     HPI: Patient is a 74 y.o. female seen today for readmission to [facility] ([facility] time now) after hospitalization at [facility] from 8/6-8/11/21.  She has been here after left hip fx and cervical fx.  She has a h/o Parkinsonism, spastic hemiplegia, neuropathy, hoarding, anxiety, chronic back pain, depression, hyperlipidemia and failure to thrive. She was observed in the ED this last time for failure to thrive.  She had presented to the ED with buttock pain and not managing well at home.  She was incontinent of stool and had excoriations on her buttocks.  She had been at home a few days alone when family left to go to [facility] and she was unable to get up and dress herself or get out of bed per  ED report.  She did microwave some frozen food.  She was very weak.    Of note, when I'd seen her for her admission last time, there were plans in place for a CNA to help her at home.  Not sure what happened to that.  Labs in the ED were unremarkable.  UA c+s "clean catch" was done and grew out numerous organisms suggesting contamination.  WBC normal on cbc and covid test negative.  She was given a liter of LR.    Clearly, she needs to stay for long-term care at this point.  This has not been her desire.    Past Medical History:  Diagnosis Date  . Anemia    none since hysterectomy  . Arthritis    LlTHA 11'15(had a fall)-"remains tender"  . Depression   . Heart murmur    history of  . Hyperlipidemia   . Hypertension   . Parkinson's disease (HCC)   . Stroke [facility])    4-5 yrs ago(left sided weakness) -no residual  . Transfusion history    age 74- "hysterectomy"   Past Surgical History:  Procedure Laterality Date  . ABDOMINAL HYSTERECTOMY    . COLONOSCOPY WITH PROPOFOL N/A 12/25/2014   Procedure: COLONOSCOPY WITH PROPOFOL;  Surgeon: Charolett Bumpers, MD;  Location: [facility] ENDOSCOPY;  Service: Endoscopy;  Laterality: N/A;  . FRACTURE SURGERY  01/22/12   fx of eye socket from fall-"retained plate"  . HAND  SURGERY Left    nerve release -many yrs ago  . HIP ARTHROPLASTY Left 09/16/2014   Procedure: ARTHROPLASTY BIPOLAR HIP;  Surgeon: Sheral Apley, MD;  Location: [facility] OR;  Service: Orthopedics;  Laterality: Left;    Social History   Socioeconomic History  . Marital status: Widowed    Spouse name: Not on file  . Number of children: Not on file  . Years of education: Not on file  . Highest education level: Not on file  Occupational History  . Not on file  Tobacco Use  . Smoking status: Never Smoker  . Smokeless tobacco: Never Used  Vaping Use  . Vaping Use: Never used  Substance and Sexual Activity  . Alcohol use: Yes    Comment: occasional beer  . Drug use: Never  . Sexual  activity: Not on file  Other Topics Concern  . Not on file  Social History Narrative   Widowed.    Social Determinants of Health   Financial Resource Strain:   . Difficulty of Paying Living Expenses:   Food Insecurity:   . Worried About Programme researcher, broadcasting/film/video in the Last Year:   . Barista in the Last Year:   Transportation Needs:   . Freight forwarder (Medical):   Marland Kitchen Lack of Transportation (Non-Medical):   Physical Activity:   . Days of Exercise per Week:   . Minutes of Exercise per Session:   Stress:   . Feeling of Stress :   Social Connections:   . Frequency of Communication with Friends and Family:   . Frequency of Social Gatherings with Friends and Family:   . Attends Religious Services:   . Active Member of Clubs or Organizations:   . Attends Banker Meetings:   Marland Kitchen Marital Status:     reports that she has never smoked. She has never used smokeless tobacco. She reports current alcohol use. She reports that she does not use drugs.  Functional Status Survey:    Family History  Problem Relation Age of Onset  . Hyperlipidemia Mother   . Hypertension Mother   . Diabetes Mother   . Heart disease Mother        aortic vavle  replaced  . Hypertension Father   . Hyperlipidemia Father   . Heart disease Father        aortic valve replaced  . Alzheimer's disease Father     Health Maintenance  Topic Date Due  . Hepatitis C Screening  Never done  . TETANUS/TDAP  Never done  . DEXA SCAN  Never done  . PNA vac Low Risk Adult (1 of 2 - PCV13) Never done  . INFLUENZA VACCINE  06/09/2020  . COLONOSCOPY  12/25/2024  . COVID-19 Vaccine  Completed    Allergies  Allergen Reactions  . Percodan [Oxycodone-Aspirin] Nausea Only  . Oxycodone Nausea Only    Outpatient Encounter Medications as of 06/21/2020  Medication Sig  . acetaminophen (TYLENOL) 500 MG tablet Take 2 tablets (1,000 mg total) by mouth every 8 (eight) hours as needed.  Marland Kitchen alendronate  (FOSAMAX) 70 MG tablet Take 1 tablet (70 mg total) by mouth once a week. Take with 8 oz of water 30 minutes prior to food/meds. Do not lie down for 30 minutes.  . ARIPiprazole (ABILIFY) 10 MG tablet Take 1 tablet (10 mg total) by mouth daily.  Marland Kitchen aspirin 81 MG chewable tablet Chew 81 mg by mouth daily.  Marland Kitchen atorvastatin (LIPITOR) 40 MG  tablet Take 1 tablet (40 mg total) by mouth daily.  Marland Kitchen. buPROPion (WELLBUTRIN XL) 150 MG 24 hr tablet Take 3 tablets (450 mg total) by mouth daily.  . carbidopa-levodopa (SINEMET) 25-100 MG tablet Take 1 tablet by mouth 3 (three) times daily.  . Cholecalciferol (VITAMIN D) 50 MCG (2000 UT) CAPS Take 2,000 Units by mouth daily.   Marland Kitchen. gabapentin (NEURONTIN) 300 MG capsule Take 1 capsule (300 mg total) by mouth 3 (three) times daily.  Marland Kitchen. lactose free nutrition (BOOST) LIQD Take 237 mLs by mouth 2 (two) times daily between meals.  . melatonin 5 MG TABS Take 5 mg by mouth at bedtime.   . metoprolol tartrate (LOPRESSOR) 25 MG tablet Take 1 tablet (25 mg total) by mouth 2 (two) times daily.  . Multiple Vitamin (MULTIVITAMIN) tablet Take 1 tablet by mouth daily.  . pantoprazole (PROTONIX) 40 MG tablet Take 1 tablet (40 mg total) by mouth daily.  . sertraline (ZOLOFT) 100 MG tablet Take 2 tablets (200 mg total) by mouth daily.  Marland Kitchen. tiZANidine (ZANAFLEX) 2 MG tablet Take 1 tablet (2 mg total) by mouth 2 (two) times daily. QAM and QHS  . traZODone (DESYREL) 50 MG tablet Take 50 mg by mouth at bedtime.   . [DISCONTINUED] carbidopa-levodopa (SINEMET IR) 25-100 MG tablet Take 1 tablet by mouth 3 (three) times daily.   No facility-administered encounter medications on file as of 06/21/2020.    Review of Systems  Constitutional: Positive for malaise/fatigue. Negative for chills and fever.  HENT: Negative for congestion and sore throat.   Eyes: Negative for blurred vision.       Glasses  Respiratory: Negative for shortness of breath.   Cardiovascular: Negative for chest pain,  palpitations and leg swelling.  Gastrointestinal: Negative for abdominal pain, blood in stool, constipation, diarrhea and melena.  Genitourinary: Negative for dysuria.  Musculoskeletal: Positive for myalgias and neck pain. Negative for back pain.  Neurological: Positive for tremors, sensory change and focal weakness. Negative for dizziness and loss of consciousness.  Psychiatric/Behavioral: Positive for memory loss. Negative for depression. The patient is not nervous/anxious and does not have insomnia.     Vitals:   06/21/20 1127  BP: 124/74  Pulse: 65  Temp: (!) 96.9 F (36.1 C)  Weight: 113 lb (51.3 kg)  Height: 5\' 3"  (1.6 m)   Body mass index is 20.02 kg/m. Physical Exam Vitals reviewed.  Constitutional:      General: She is not in acute distress.    Appearance: Normal appearance. She is not toxic-appearing.  HENT:     Head: Normocephalic and atraumatic.     Right Ear: External ear normal.     Left Ear: External ear normal.     Nose: Nose normal.     Mouth/Throat:     Pharynx: Oropharynx is clear.  Eyes:     Extraocular Movements: Extraocular movements intact.     Conjunctiva/sclera: Conjunctivae normal.     Pupils: Pupils are equal, round, and reactive to light.     Comments: glasses  Neck:     Comments: Bilateral SCMs tender; happy to be off cervical collar Cardiovascular:     Rate and Rhythm: Normal rate and regular rhythm.     Pulses: Normal pulses.     Heart sounds: Normal heart sounds.  Pulmonary:     Effort: Pulmonary effort is normal.     Breath sounds: Normal breath sounds. No wheezing, rhonchi or rales.  Abdominal:     General: Bowel sounds  are normal. There is no distension.     Palpations: Abdomen is soft.     Tenderness: There is no abdominal tenderness. There is no guarding or rebound.  Musculoskeletal:     Cervical back: Neck supple. Tenderness present.     Right lower leg: No edema.     Left lower leg: No edema.     Comments: Left hemiparesis   Lymphadenopathy:     Cervical: No cervical adenopathy.  Skin:    General: Skin is warm and dry.     Capillary Refill: Capillary refill takes less than 2 seconds.     Comments: Small excoriation of medial left buttock with surrounding erythema   Neurological:     Mental Status: She is alert.     Motor: Weakness present.     Gait: Gait abnormal.     Comments: Hemiparesis; resting tremor  Psychiatric:        Mood and Affect: Mood normal.     Labs reviewed: Basic Metabolic Panel: Recent Labs    04/01/20 0333 04/01/20 0333 04/02/20 0310 04/04/20 0000 05/21/20 0252 05/21/20 0252 05/22/20 0302 05/23/20 0303 05/29/20 0000 06/14/20 2000  NA 140   < > 140   < > 140   < >  --  140 142 139  K 4.5   < > 4.7   < > 4.1   < >  --  3.8 4.5 3.7  CL 104   < > 108   < > 103   < >  --  103 106 105  CO2 29   < > 22   < > 29   < >  --  29 25* 21*  GLUCOSE 101*   < > 105*   < > 113*  --   --  106*  --  83  BUN 29*   < > 34*   < > 16   < >  --  18 13 14   CREATININE 0.93   < > 0.71   < > 0.61   < >  --  0.65 0.6 0.80  CALCIUM 8.7*   < > 9.0   < > 8.9   < >  --  8.5* 9.0 9.2  MG 2.1   < > 2.0  --  1.7  --  2.0 1.8  --   --   PHOS 4.8*  --  4.4  --   --   --   --  3.1  --   --    < > = values in this interval not displayed.   Liver Function Tests: Recent Labs    05/19/20 0119 05/23/20 0303 06/14/20 2000  AST 45* 19 22  ALT 48* 9 26  ALKPHOS 124 88 85  BILITOT 1.1 0.4 0.9  PROT 7.9 6.0* 7.2  ALBUMIN 4.1 3.2* 4.0   No results for input(s): LIPASE, AMYLASE in the last 8760 hours. Recent Labs    05/20/20 0323  AMMONIA 27   CBC: Recent Labs    04/04/20 0000 05/19/20 0119 05/21/20 0252 05/21/20 0252 05/23/20 0303 05/29/20 0000 06/14/20 2000  WBC 7.2   < > 5.2   < > 5.0 4.8 8.7  NEUTROABS 4  --   --   --  2.5  --  6.3  HGB 10.4*   < > 10.9*   < > 10.5* 10.9* 11.4*  HCT 30*   < > 35.0*   < > 33.9* 33* 36.2  MCV  --    < >  91.1  --  91.6  --  89.8  PLT 374   < > 341   < > 297  348 393   < > = values in this interval not displayed.   Cardiac Enzymes: Recent Labs    05/19/20 0119 05/20/20 0323  CKTOTAL 245* 88   BNP: Invalid input(s): POCBNP Lab Results  Component Value Date   HGBA1C (H) 03/11/2011    6.0 (NOTE)                                                                       According to the ADA Clinical Practice Recommendations for 2011, when HbA1c is used as a screening test:   >=6.5%   Diagnostic of Diabetes Mellitus           (if abnormal result  is confirmed)  5.7-6.4%   Increased risk of developing Diabetes Mellitus  References:Diagnosis and Classification of Diabetes Mellitus,Diabetes Care,2011,34(Suppl 1):S62-S69 and Standards of Medical Care in         Diabetes - 2011,Diabetes Care,2011,34  (Suppl 1):S11-S61.   Lab Results  Component Value Date   TSH 0.878 05/20/2020   Lab Results  Component Value Date   VITAMINB12 597 05/20/2020     Assessment/Plan 1. Parkinsonism, unspecified Parkinsonism type (HCC) -cont sinemet, walker, PT, OT here but needs assistance at home with meals and other ADLs or to live in a facility to be safe at this point  2. DNR (do not resuscitate) - Do not attempt resuscitation (DNR)  3. Frailty syndrome in geriatric patient -ongoing, cont boost, therapy and get her assistance needed at home or long-term placement  4. Spastic hemiplegia affecting nondominant side (HCC) -also contributes to recurrent falls and inability to care for self  5. Current mild episode of major depressive disorder without prior episode (HCC) -cont trazodone at hs, melatonin, wellbutrin  6. Failure to thrive in adult -cont supplement shakes, regular meals, and d/c only with regular help with meals or arrange admission where this is offered for her (stay here?) -pt preference is to be home with assistance, but this must be completely secured before she can leave again  Family/ staff Communication: discussed with snf nurse  Add Dinapoli  L. Jatavious Peppard, D.O. Geriatrics [facility] [address], [place] [address] Cell Phone (Mon-Fri 8am-5pm):  [phone] On Call:  [phone] & follow prompts after 5pm & weekends Office Phone:  [phone] Office Fax:  [phone]

## 2020-06-24 NOTE — Progress Notes (Signed)
[facility] Room Number: 112-P Place of Service:  SNF [phone])  Provider: Richarda Blade FNP-C   PCP: Juluis Rainier, MD Patient Care Team: Juluis Rainier, MD as PCP - General [facility])  Extended Emergency Contact Information Primary Emergency Contact: Moishe Spice, [place] [address] [place] Phone: [phone] Relation: Son Secondary Emergency Contact: Annia Friendly Address: [address]          [place], [place] [address] [place] Home Phone: [phone] Relation: Friend  Code Status: DNR Goals of care:  Advanced Directive information Advanced Directives 06/21/2020  Does Patient Have a Medical Advance Directive? Yes  Type of Advance Directive Out of facility DNR (pink MOST or yellow form)  Does patient want to make changes to medical advance directive? No - Patient declined  Copy of Healthcare Power of Attorney in Chart? -  Pre-existing out of facility DNR order (yellow form or pink MOST form) Pink MOST/Yellow Form most recent copy in chart - Physician notified to receive inpatient order     Allergies  Allergen Reactions  . Percodan [Oxycodone-Aspirin] Nausea Only  . Oxycodone Nausea Only    Chief Complaint  Patient presents with  . Discharge Note    Discharge from SNF    HPI:  74 y.o. female seen today at [facility] for discharge home.she has a medical history of Hypertension,Parkinsonism,Hx of stroke with left Hemiplegia,depression,Osteoporosis among other conditions.She was here for short term rehabilitation for post hospital admission 05/19/2020 - 05/23/2020 for acute metabolic encephalopathy and generalized weakness after she presented in the ED with increased confusion.she was struggling with her ADL's at home.when EMS found her it appeared she had not moved or eaten  for 24 hrs.Her CK was high 245.CT of the head/spine scan and MRI  done was negative.CXR was negative for pneumonia.U/a did not show any signs of infection.IVF was given for possible rhabdomyolysis.she had PT/OT evaluated for long term care.Her delirium was thought due to dehydration.   She has worked well with PT/OT now stable for discharge home.She will be discharged home with Home health PT/OT to continue with ROM, Exercise, Gait stability and muscle strengthening.She does not require any DME has own walker at home. Home health services will be arranged by facility social worker prior to discharge. Prescription medication will be written x 1 month then patient to follow up with PCP in 1-2 weeks.she denies any acute issues this visit. Facility staff report no new concerns.   Past Medical History:  Diagnosis Date  . Anemia    none since hysterectomy  . Arthritis    LlTHA 11'15(had a fall)-"remains tender"  . Depression   . Heart murmur    history of  . Hyperlipidemia   . Hypertension   . Parkinson's disease (HCC)   . Stroke [facility])    4-5 yrs ago(left sided weakness) -no residual  . Transfusion history    age 89- "hysterectomy"    Past Surgical History:  Procedure Laterality Date  . ABDOMINAL HYSTERECTOMY    . COLONOSCOPY WITH PROPOFOL N/A 12/25/2014   Procedure: COLONOSCOPY WITH PROPOFOL;  Surgeon: Charolett Bumpers, MD;  Location: [facility];  Service: Endoscopy;  Laterality: N/A;  . FRACTURE SURGERY  01/22/12   fx of eye socket from fall-"retained plate"  . HAND SURGERY Left    nerve release -many yrs ago  . HIP ARTHROPLASTY Left 09/16/2014  Procedure: ARTHROPLASTY BIPOLAR HIP;  Surgeon: Sheral Apley, MD;  Location: [facility] OR;  Service: Orthopedics;  Laterality: Left;      reports that she has never smoked. She has never used smokeless tobacco. She reports current alcohol use. She reports that she does not use drugs. Social History   Socioeconomic History  . Marital status: Widowed    Spouse name: Not on file  . Number of children: Not on  file  . Years of education: Not on file  . Highest education level: Not on file  Occupational History  . Not on file  Tobacco Use  . Smoking status: Never Smoker  . Smokeless tobacco: Never Used  Vaping Use  . Vaping Use: Never used  Substance and Sexual Activity  . Alcohol use: Yes    Comment: occasional beer  . Drug use: Never  . Sexual activity: Not on file  Other Topics Concern  . Not on file  Social History Narrative   Widowed.    Social Determinants of Health   Financial Resource Strain:   . Difficulty of Paying Living Expenses:   Food Insecurity:   . Worried About Programme researcher, broadcasting/film/video in the Last Year:   . Barista in the Last Year:   Transportation Needs:   . Freight forwarder (Medical):   Marland Kitchen Lack of Transportation (Non-Medical):   Physical Activity:   . Days of Exercise per Week:   . Minutes of Exercise per Session:   Stress:   . Feeling of Stress :   Social Connections:   . Frequency of Communication with Friends and Family:   . Frequency of Social Gatherings with Friends and Family:   . Attends Religious Services:   . Active Member of Clubs or Organizations:   . Attends Banker Meetings:   Marland Kitchen Marital Status:   Intimate Partner Violence:   . Fear of Current or Ex-Partner:   . Emotionally Abused:   Marland Kitchen Physically Abused:   . Sexually Abused:     Allergies  Allergen Reactions  . Percodan [Oxycodone-Aspirin] Nausea Only  . Oxycodone Nausea Only    Pertinent  Health Maintenance Due  Topic Date Due  . DEXA SCAN  Never done  . PNA vac Low Risk Adult (1 of 2 - PCV13) Never done  . INFLUENZA VACCINE  06/09/2020  . COLONOSCOPY  12/25/2024    Medications: Outpatient Encounter Medications as of 06/06/2020  Medication Sig  . acetaminophen (TYLENOL) 500 MG tablet Take 2 tablets (1,000 mg total) by mouth every 8 (eight) hours as needed.  Marland Kitchen alendronate (FOSAMAX) 70 MG tablet Take 1 tablet (70 mg total) by mouth once a week. Take with 8  oz of water 30 minutes prior to food/meds. Do not lie down for 30 minutes.  . ARIPiprazole (ABILIFY) 10 MG tablet Take 1 tablet (10 mg total) by mouth daily.  Marland Kitchen aspirin 81 MG chewable tablet Chew 81 mg by mouth daily.  Marland Kitchen atorvastatin (LIPITOR) 40 MG tablet Take 1 tablet (40 mg total) by mouth daily.  Marland Kitchen buPROPion (WELLBUTRIN XL) 150 MG 24 hr tablet Take 3 tablets (450 mg total) by mouth daily.  . Cholecalciferol (VITAMIN D) 50 MCG (2000 UT) CAPS Take 2,000 Units by mouth daily.   Marland Kitchen gabapentin (NEURONTIN) 300 MG capsule Take 1 capsule (300 mg total) by mouth 3 (three) times daily.  Marland Kitchen lactose free nutrition (BOOST) LIQD Take 237 mLs by mouth 2 (two) times daily between meals.  Marland Kitchen  melatonin 5 MG TABS Take 5 mg by mouth at bedtime.   . metoprolol tartrate (LOPRESSOR) 25 MG tablet Take 1 tablet (25 mg total) by mouth 2 (two) times daily.  . Multiple Vitamin (MULTIVITAMIN) tablet Take 1 tablet by mouth daily.  . pantoprazole (PROTONIX) 40 MG tablet Take 1 tablet (40 mg total) by mouth daily.  . sertraline (ZOLOFT) 100 MG tablet Take 2 tablets (200 mg total) by mouth daily.  Marland Kitchen tiZANidine (ZANAFLEX) 2 MG tablet Take 1 tablet (2 mg total) by mouth 2 (two) times daily. QAM and QHS  . [DISCONTINUED] alendronate (FOSAMAX) 70 MG tablet Take 1 tablet (70 mg total) by mouth once a week. Take with 8 oz of water 30 minutes prior to food/meds. Do not lie down for 30 minutes.  . [DISCONTINUED] ARIPiprazole (ABILIFY) 10 MG tablet Take 1 tablet (10 mg total) by mouth daily.  . [DISCONTINUED] atorvastatin (LIPITOR) 40 MG tablet Take 1 tablet (40 mg total) by mouth daily.  . [DISCONTINUED] buPROPion (WELLBUTRIN XL) 150 MG 24 hr tablet TAKE 3 TABLETS BY MOUTH  DAILY  . [DISCONTINUED] carbidopa-levodopa (SINEMET IR) 25-100 MG tablet Take 1 tablet by mouth 3 (three) times daily.  . [DISCONTINUED] carbidopa-levodopa (SINEMET IR) 25-100 MG tablet Take 1 tablet by mouth 3 (three) times daily.  . [DISCONTINUED] gabapentin  (NEURONTIN) 300 MG capsule Take 1 capsule (300 mg total) by mouth 3 (three) times daily.  . [DISCONTINUED] metoprolol tartrate (LOPRESSOR) 25 MG tablet Take 25 mg by mouth 2 (two) times daily.  . [DISCONTINUED] pantoprazole (PROTONIX) 40 MG tablet Take 40 mg by mouth daily.  . [DISCONTINUED] sertraline (ZOLOFT) 100 MG tablet TAKE 2 TABLETS BY MOUTH  DAILY  . [DISCONTINUED] tiZANidine (ZANAFLEX) 2 MG tablet Take 1 tablet (2 mg total) by mouth 2 (two) times daily. QAM and QHS  . [DISCONTINUED] bisacodyl (FLEET) 10 MG/30ML ENEM Place 10 mg rectally once. If Milk Of Magnesium P.O doesn't work. Give 10mg  of Bisacodyl rectally.  . [DISCONTINUED] traZODone (DESYREL) 50 MG tablet TAKE 1 TO 2 TABLETS BY  MOUTH AT BEDTIME AS NEEDED  FOR SLEEP   No facility-administered encounter medications on file as of 06/06/2020.     Review of Systems  Constitutional: Negative for appetite change, chills, fatigue and fever.  HENT: Negative for congestion, postnasal drip, rhinorrhea, sinus pressure, sinus pain, sneezing, sore throat and trouble swallowing.   Eyes: Negative for pain, discharge, redness and itching.  Respiratory: Negative for cough, chest tightness, shortness of breath and wheezing.   Cardiovascular: Negative for chest pain, palpitations and leg swelling.  Gastrointestinal: Negative for abdominal distention, abdominal pain, constipation, diarrhea, nausea and vomiting.  Endocrine: Negative for cold intolerance, heat intolerance, polydipsia, polyphagia and polyuria.  Genitourinary: Negative for difficulty urinating, dysuria, flank pain, frequency and urgency.  Musculoskeletal: Positive for gait problem. Negative for joint swelling, myalgias and neck pain.  Skin: Negative for color change, pallor, rash and wound.  Neurological: Positive for tremors. Negative for dizziness, speech difficulty, weakness, light-headedness, numbness and headaches.  Hematological: Does not bruise/bleed easily.    Psychiatric/Behavioral: Negative for agitation, behavioral problems and sleep disturbance. The patient is not nervous/anxious.     Vitals:   06/06/20 1609  BP: 118/68  Pulse: 77  Resp: 18  Temp: 98 F (36.7 C)  SpO2: 96%  Weight: 115 lb (52.2 kg)  Height: 5\' 2"  (1.575 m)   Body mass index is 21.03 kg/m. Physical Exam Vitals reviewed.  Constitutional:      General: She  is not in acute distress.    Appearance: She is not ill-appearing.  HENT:     Head: Normocephalic.     Nose: Nose normal. No congestion or rhinorrhea.     Mouth/Throat:     Mouth: Mucous membranes are moist.     Pharynx: Oropharynx is clear. No oropharyngeal exudate or posterior oropharyngeal erythema.  Eyes:     General: No scleral icterus.       Right eye: No discharge.        Left eye: No discharge.     Extraocular Movements: Extraocular movements intact.     Conjunctiva/sclera: Conjunctivae normal.     Pupils: Pupils are equal, round, and reactive to light.  Neck:     Comments: Aspen collar in place with soft protective dressing on shoulder areas.  Cardiovascular:     Rate and Rhythm: Normal rate and regular rhythm.     Pulses: Normal pulses.     Heart sounds: Normal heart sounds. No murmur heard.  No friction rub. No gallop.   Pulmonary:     Effort: Pulmonary effort is normal. No respiratory distress.     Breath sounds: Normal breath sounds. No wheezing, rhonchi or rales.  Chest:     Chest wall: No tenderness.  Abdominal:     General: Bowel sounds are normal. There is no distension.     Palpations: Abdomen is soft. There is no mass.     Tenderness: There is no abdominal tenderness. There is no right CVA tenderness, left CVA tenderness, guarding or rebound.  Musculoskeletal:        General: No swelling or tenderness.     Cervical back: Normal range of motion. No rigidity or tenderness.     Right lower leg: No edema.     Left lower leg: No edema.     Comments: Unsteady gait walks with walker    Lymphadenopathy:     Cervical: No cervical adenopathy.  Skin:    General: Skin is warm and dry.     Coloration: Skin is not pale.     Findings: No bruising, erythema or rash.     Comments: Bilateral arms and legs ecchymosis from fall at home  progressive healing brownish in color   Neurological:     Mental Status: She is alert. Mental status is at baseline.     Cranial Nerves: No cranial nerve deficit.     Motor: No weakness.     Coordination: Coordination normal.     Gait: Gait abnormal.     Comments: Tremors with left side weakness   Psychiatric:        Mood and Affect: Mood normal.        Behavior: Behavior normal.        Thought Content: Thought content normal.        Judgment: Judgment normal.      Labs reviewed: Basic Metabolic Panel: Recent Labs    04/01/20 0333 04/01/20 0333 04/02/20 0310 04/04/20 0000 05/21/20 0252 05/21/20 0252 05/22/20 0302 05/23/20 0303 05/29/20 0000 06/14/20 2000  NA 140   < > 140   < > 140   < >  --  140 142 139  K 4.5   < > 4.7   < > 4.1   < >  --  3.8 4.5 3.7  CL 104   < > 108   < > 103   < >  --  103 106 105  CO2 29   < >  22   < > 29   < >  --  29 25* 21*  GLUCOSE 101*   < > 105*   < > 113*  --   --  106*  --  83  BUN 29*   < > 34*   < > 16   < >  --  18 13 14   CREATININE 0.93   < > 0.71   < > 0.61   < >  --  0.65 0.6 0.80  CALCIUM 8.7*   < > 9.0   < > 8.9   < >  --  8.5* 9.0 9.2  MG 2.1   < > 2.0  --  1.7  --  2.0 1.8  --   --   PHOS 4.8*  --  4.4  --   --   --   --  3.1  --   --    < > = values in this interval not displayed.   Liver Function Tests: Recent Labs    05/19/20 0119 05/23/20 0303 06/14/20 2000  AST 45* 19 22  ALT 48* 9 26  ALKPHOS 124 88 85  BILITOT 1.1 0.4 0.9  PROT 7.9 6.0* 7.2  ALBUMIN 4.1 3.2* 4.0    Recent Labs    05/20/20 0323  AMMONIA 27   CBC: Recent Labs    04/04/20 0000 05/19/20 0119 05/21/20 0252 05/21/20 0252 05/23/20 0303 05/29/20 0000 06/14/20 2000  WBC 7.2   < > 5.2   < > 5.0  4.8 8.7  NEUTROABS 4  --   --   --  2.5  --  6.3  HGB 10.4*   < > 10.9*   < > 10.5* 10.9* 11.4*  HCT 30*   < > 35.0*   < > 33.9* 33* 36.2  MCV  --    < > 91.1  --  91.6  --  89.8  PLT 374   < > 341   < > 297 348 393   < > = values in this interval not displayed.   Cardiac Enzymes: Recent Labs    05/19/20 0119 05/20/20 0323  CKTOTAL 245* 88   CBG: Recent Labs    05/19/20 0051 05/19/20 0639  GLUCAP 120* 83    Procedures and Imaging Studies During Stay: No results found.  Assessment/Plan:    1. Unsteady gait Has worked well with PT/ OT.She will discharge home PT/OT to continue with ROM, Exercise, Gait stability and muscle strengthening.No new DME required has own walker at home. Fall and safety precautions.   2. Closed nondisplaced fracture of first cervical vertebra with routine healing, unspecified fracture morphology, subsequent encounter Aspen collar in place with soft protective dressing on shoulder areas. Pain under control.continue on tiZanidine - tiZANidine (ZANAFLEX) 2 MG tablet; Take 1 tablet (2 mg total) by mouth 2 (two) times daily. QAM and QHS  Dispense: 60 tablet; Refill: 0  3. Periprosthetic fracture of hip, subsequent encounter Status post fall at home. - continue with WBAT with walker  - Home health PT/OT to continue with ROM, Exercise, Gait stability and muscle strengthening. - tiZANidine (ZANAFLEX) 2 MG tablet; Take 1 tablet (2 mg total) by mouth 2 (two) times daily. QAM and QHS  Dispense: 60 tablet; Refill: 0  4. Parkinsonism, unspecified Parkinsonism type (HCC) -  Tremors noted. - Progressive decline but insist on going home.she is high risk for readmission  - continue on sinemet  - continue to follow up with  Neuro  5. Polyneuropathy Could be a contributory to her falls. - continue on Gabapentin  - gabapentin (NEURONTIN) 300 MG capsule; Take 1 capsule (300 mg total) by mouth 3 (three) times daily.  Dispense: 90 capsule; Refill: 0  6. Senile  osteoporosis No latest Bone density for review.will defer to PCP. - continue on alendronate and vitamin D supplement - alendronate (FOSAMAX) 70 MG tablet; Take 1 tablet (70 mg total) by mouth once a week. Take with 8 oz of water 30 minutes prior to food/meds. Do not lie down for 30 minutes.  Dispense: 4 tablet; Refill: 0  7. Essential hypertension B/p controlled. - continue on metoprolol  - metoprolol tartrate (LOPRESSOR) 25 MG tablet; Take 1 tablet (25 mg total) by mouth 2 (two) times daily.  Dispense: 60 tablet; Refill: 0  8. Gastroesophageal reflux disease without esophagitis Symptoms controlled.continue on Protonix. - continue to avoid foods which aggravating symptoms.  - pantoprazole (PROTONIX) 40 MG tablet; Take 1 tablet (40 mg total) by mouth daily.  Dispense: 30 tablet; Refill: 0  9. Current mild episode of major depressive disorder without prior episode (HCC) Mood stable.continue on Aripiprazole ,sertraline and Wellbutrin XL  - ARIPiprazole (ABILIFY) 10 MG tablet; Take 1 tablet (10 mg total) by mouth daily.  Dispense: 30 tablet; Refill: 0 - buPROPion (WELLBUTRIN XL) 150 MG 24 hr tablet; Take 3 tablets (450 mg total) by mouth daily.  Dispense: 90 tablet; Refill: 0 - sertraline (ZOLOFT) 100 MG tablet; Take 2 tablets (200 mg total) by mouth daily.  Dispense: 60 tablet; Refill: 0  10. GAD (generalized anxiety disorder) Stable.continue sertraline   Patient is being discharged with the following home health services:   -PT/OT for ROM, exercise, gait stability and muscle strengthening   Patient is being discharged with the following durable medical equipment:   - None as own walker.  Patient has been advised to f/u with their PCP in 1-2 weeks to for a transitions of care visit.Social services at their facility was responsible for arranging this appointment.  Pt was provided with adequate prescriptions of noncontrolled medications to reach the scheduled appointment.For controlled  substances, a limited supply was provided as appropriate for the individual patient. If the pt normally receives these medications from a pain clinic or has a contract with another physician, these medications should be received from that clinic or physician only).    Future labs/tests needed:  CBC, BMP in 1-2 weeks PCP

## 2020-07-04 ENCOUNTER — Encounter: Payer: Self-pay | Admitting: Family

## 2020-07-04 ENCOUNTER — Non-Acute Institutional Stay (SKILLED_NURSING_FACILITY): Payer: Medicare Other | Admitting: Family

## 2020-07-04 DIAGNOSIS — R627 Adult failure to thrive: Secondary | ICD-10-CM

## 2020-07-04 DIAGNOSIS — G629 Polyneuropathy, unspecified: Secondary | ICD-10-CM | POA: Diagnosis not present

## 2020-07-04 DIAGNOSIS — I10 Essential (primary) hypertension: Secondary | ICD-10-CM

## 2020-07-04 DIAGNOSIS — M810 Age-related osteoporosis without current pathological fracture: Secondary | ICD-10-CM | POA: Insufficient documentation

## 2020-07-04 DIAGNOSIS — Z96649 Presence of unspecified artificial hip joint: Secondary | ICD-10-CM

## 2020-07-04 DIAGNOSIS — F320 Major depressive disorder, single episode, mild: Secondary | ICD-10-CM

## 2020-07-04 DIAGNOSIS — K219 Gastro-esophageal reflux disease without esophagitis: Secondary | ICD-10-CM | POA: Insufficient documentation

## 2020-07-04 DIAGNOSIS — E782 Mixed hyperlipidemia: Secondary | ICD-10-CM

## 2020-07-04 DIAGNOSIS — G20 Parkinson's disease: Secondary | ICD-10-CM | POA: Insufficient documentation

## 2020-07-04 DIAGNOSIS — R296 Repeated falls: Secondary | ICD-10-CM | POA: Insufficient documentation

## 2020-07-04 DIAGNOSIS — R2681 Unsteadiness on feet: Secondary | ICD-10-CM | POA: Insufficient documentation

## 2020-07-04 DIAGNOSIS — M978XXD Periprosthetic fracture around other internal prosthetic joint, subsequent encounter: Secondary | ICD-10-CM

## 2020-07-04 DIAGNOSIS — G4700 Insomnia, unspecified: Secondary | ICD-10-CM

## 2020-07-04 DIAGNOSIS — S12001D Unspecified nondisplaced fracture of first cervical vertebra, subsequent encounter for fracture with routine healing: Secondary | ICD-10-CM

## 2020-07-04 DIAGNOSIS — G20C Parkinsonism, unspecified: Secondary | ICD-10-CM | POA: Insufficient documentation

## 2020-07-04 MED ORDER — ATORVASTATIN CALCIUM 40 MG PO TABS: 40.0000 mg | ORAL_TABLET | Freq: Every day | ORAL | 0 refills | Status: AC

## 2020-07-04 MED ORDER — METOPROLOL TARTRATE 25 MG PO TABS: 25.0000 mg | ORAL_TABLET | Freq: Two times a day (BID) | ORAL | 0 refills | Status: AC

## 2020-07-04 MED ORDER — TRAZODONE HCL 50 MG PO TABS: 50.0000 mg | ORAL_TABLET | Freq: Every day | ORAL | 0 refills | Status: AC

## 2020-07-04 MED ORDER — ALENDRONATE SODIUM 70 MG PO TABS: 70.0000 mg | ORAL_TABLET | ORAL | 0 refills | Status: AC

## 2020-07-04 MED ORDER — GABAPENTIN 300 MG PO CAPS: 300.0000 mg | ORAL_CAPSULE | Freq: Three times a day (TID) | ORAL | 0 refills | Status: AC

## 2020-07-04 MED ORDER — BUPROPION HCL ER (XL) 150 MG PO TB24: 450.0000 mg | ORAL_TABLET | Freq: Every day | ORAL | 0 refills | Status: AC

## 2020-07-04 MED ORDER — ARIPIPRAZOLE 10 MG PO TABS: 10.0000 mg | ORAL_TABLET | Freq: Every day | ORAL | 0 refills | Status: AC

## 2020-07-04 MED ORDER — SERTRALINE HCL 100 MG PO TABS: 200.0000 mg | ORAL_TABLET | Freq: Every day | ORAL | 0 refills | Status: AC

## 2020-07-04 MED ORDER — TIZANIDINE HCL 2 MG PO TABS: 2.0000 mg | ORAL_TABLET | Freq: Two times a day (BID) | ORAL | 0 refills | Status: AC

## 2020-07-04 MED ORDER — PANTOPRAZOLE SODIUM 40 MG PO TBEC: 40.0000 mg | DELAYED_RELEASE_TABLET | Freq: Every day | ORAL | 0 refills | Status: AC

## 2020-07-04 MED ORDER — CARBIDOPA-LEVODOPA 25-100 MG PO TABS: 1.0000 | ORAL_TABLET | Freq: Three times a day (TID) | ORAL | 0 refills | Status: AC

## 2020-07-04 NOTE — Progress Notes (Signed)
[facility] Room Number: 108-P Place of Service:  SNF [phone])  Provider: Richarda Blade FNP-C   PCP: Juluis Rainier, MD Patient Care Team: Juluis Rainier, MD as PCP - General [facility] Medicine)  Extended Emergency Contact Information Primary Emergency Contact: Moishe Spice, [place] [address] [place] Phone: [phone] Relation: Son Secondary Emergency Contact: Annia Friendly Address: [address]          [place], [place] [address] [place] Home Phone: [phone] Relation: Friend  Code Status: DNR Goals of care:  Advanced Directive information Advanced Directives 07/04/2020  Does Patient Have a Medical Advance Directive? Yes  Type of Advance Directive Out of facility DNR (pink MOST or yellow form)  Does patient want to make changes to medical advance directive? No - Patient declined  Copy of Healthcare Power of Attorney in Chart? -  Pre-existing out of facility DNR order (yellow form or pink MOST form) -     Allergies  Allergen Reactions  . Percodan [Oxycodone-Aspirin] Nausea Only  . Oxycodone Nausea Only    Chief Complaint  Patient presents with  . Discharge Note    Discharge from SNF to [facility] 07/05/2020    HPI:  74 y.o. female seen today at [facility] for discharge to [facility].she has a medical history of parkinsonism,Hypertension,Hyperlipidemia,spastic hemiplegia,Neuropathy,Generalized anxiety,chronic back pain ,major depression ,frequent falls among other conditions.She was here for short term rehabilitation for the Third time for post hospital admission at [facility] from 06/14/2020 - 06/19/2020 for failure to thrive after presenting to ED with with buttock pain and not managing well at home.she was incontinent of stool and had excoriations on her buttocks.she was home a few days alone after family left to [facility] and was not able to  get up and dress herself or get out of the bed per ED note.she did microwave some frozen food.she was weak and SNF was recommended.she was discharged previously with PT/OT with plans for home health Aide.she has had unremarkable stay here in Rehab.she states son who lives in [place] will be coming to pick up and take her to [facility].States feeling upset about going to ALF since both of them have not seen the place.States son will take care of her house then plans to move with son to [place].she is looking forward to be close to son,daughter in law whom she loves and grandchildren.    She has worked well with PT/OT now stable for discharge to [facility].She will be discharged with Home health PT/OT to continue with ROM, Exercise, Gait stability and muscle strengthening. She does not require any new   DME has own Rollator and Rolling walker to allow her to maintain current level of independence with ADL's.Home health services will be arranged by facility social worker prior to discharge. Prescription medication will be written x 1 month then patient to follow up with PCP in 1-2 weeks.She denies any acute issues this visit.excoriation on her bottom are healed. Facility staff report no new concerns.    Past Medical History:  Diagnosis Date  . Anemia    none since hysterectomy  . Arthritis    LlTHA 11'15(had a fall)-"remains tender"  . Depression   . Heart murmur    history of  . Hyperlipidemia   . Hypertension   . Parkinson's disease (HCC)   . Stroke (  HCC)    4-5 yrs ago(left sided weakness) -no residual  . Transfusion history    age 28- "hysterectomy"    Past Surgical History:  Procedure Laterality Date  . ABDOMINAL HYSTERECTOMY    . COLONOSCOPY WITH PROPOFOL N/A 12/25/2014   Procedure: COLONOSCOPY WITH PROPOFOL;  Surgeon: Charolett Bumpers, MD;  Location: [facility] ENDOSCOPY;  Service: Endoscopy;  Laterality: N/A;  . FRACTURE SURGERY  01/22/12   fx of eye socket from  fall-"retained plate"  . HAND SURGERY Left    nerve release -many yrs ago  . HIP ARTHROPLASTY Left 09/16/2014   Procedure: ARTHROPLASTY BIPOLAR HIP;  Surgeon: Sheral Apley, MD;  Location: [facility] OR;  Service: Orthopedics;  Laterality: Left;      reports that she has never smoked. She has never used smokeless tobacco. She reports current alcohol use. She reports that she does not use drugs. Social History   Socioeconomic History  . Marital status: Widowed    Spouse name: Not on file  . Number of children: Not on file  . Years of education: Not on file  . Highest education level: Not on file  Occupational History  . Not on file  Tobacco Use  . Smoking status: Never Smoker  . Smokeless tobacco: Never Used  Vaping Use  . Vaping Use: Never used  Substance and Sexual Activity  . Alcohol use: Yes    Comment: occasional beer  . Drug use: Never  . Sexual activity: Not on file  Other Topics Concern  . Not on file  Social History Narrative   Widowed.    Social Determinants of Health   Financial Resource Strain:   . Difficulty of Paying Living Expenses: Not on file  Food Insecurity:   . Worried About Programme researcher, broadcasting/film/video in the Last Year: Not on file  . Ran Out of Food in the Last Year: Not on file  Transportation Needs:   . Lack of Transportation (Medical): Not on file  . Lack of Transportation (Non-Medical): Not on file  Physical Activity:   . Days of Exercise per Week: Not on file  . Minutes of Exercise per Session: Not on file  Stress:   . Feeling of Stress : Not on file  Social Connections:   . Frequency of Communication with Friends and Family: Not on file  . Frequency of Social Gatherings with Friends and Family: Not on file  . Attends Religious Services: Not on file  . Active Member of Clubs or Organizations: Not on file  . Attends Banker Meetings: Not on file  . Marital Status: Not on file  Intimate Partner Violence:   . Fear of Current or Ex-Partner:  Not on file  . Emotionally Abused: Not on file  . Physically Abused: Not on file  . Sexually Abused: Not on file    Allergies  Allergen Reactions  . Percodan [Oxycodone-Aspirin] Nausea Only  . Oxycodone Nausea Only    Pertinent  Health Maintenance Due  Topic Date Due  . DEXA SCAN  Never done  . PNA vac Low Risk Adult (1 of 2 - PCV13) Never done  . INFLUENZA VACCINE  06/09/2020  . COLONOSCOPY  12/25/2024    Medications: Outpatient Encounter Medications as of 07/04/2020  Medication Sig  . acetaminophen (TYLENOL) 500 MG tablet Take 2 tablets (1,000 mg total) by mouth every 8 (eight) hours as needed.  Marland Kitchen alendronate (FOSAMAX) 70 MG tablet Take 1 tablet (70 mg total) by mouth once a  week. Take with 8 oz of water 30 minutes prior to food/meds. Do not lie down for 30 minutes.  . ARIPiprazole (ABILIFY) 10 MG tablet Take 1 tablet (10 mg total) by mouth daily.  Marland Kitchen aspirin 81 MG chewable tablet Chew 81 mg by mouth daily.  Marland Kitchen atorvastatin (LIPITOR) 40 MG tablet Take 1 tablet (40 mg total) by mouth daily.  Marland Kitchen buPROPion (WELLBUTRIN XL) 150 MG 24 hr tablet Take 3 tablets (450 mg total) by mouth daily.  . carbidopa-levodopa (SINEMET) 25-100 MG tablet Take 1 tablet by mouth 3 (three) times daily.  . Cholecalciferol (VITAMIN D) 50 MCG (2000 UT) CAPS Take 2,000 Units by mouth daily.   Marland Kitchen gabapentin (NEURONTIN) 300 MG capsule Take 1 capsule (300 mg total) by mouth 3 (three) times daily.  . melatonin 5 MG TABS Take 5 mg by mouth at bedtime.   . metoprolol tartrate (LOPRESSOR) 25 MG tablet Take 1 tablet (25 mg total) by mouth 2 (two) times daily.  . Multiple Vitamin (MULTIVITAMIN) tablet Take 1 tablet by mouth daily.  . pantoprazole (PROTONIX) 40 MG tablet Take 1 tablet (40 mg total) by mouth daily.  . sertraline (ZOLOFT) 100 MG tablet Take 2 tablets (200 mg total) by mouth daily.  Marland Kitchen tiZANidine (ZANAFLEX) 2 MG tablet Take 1 tablet (2 mg total) by mouth 2 (two) times daily. QAM and QHS  . traZODone  (DESYREL) 50 MG tablet Take 1 tablet (50 mg total) by mouth at bedtime.  . [DISCONTINUED] alendronate (FOSAMAX) 70 MG tablet Take 1 tablet (70 mg total) by mouth once a week. Take with 8 oz of water 30 minutes prior to food/meds. Do not lie down for 30 minutes.  . [DISCONTINUED] ARIPiprazole (ABILIFY) 10 MG tablet Take 1 tablet (10 mg total) by mouth daily.  . [DISCONTINUED] atorvastatin (LIPITOR) 40 MG tablet Take 1 tablet (40 mg total) by mouth daily.  . [DISCONTINUED] buPROPion (WELLBUTRIN XL) 150 MG 24 hr tablet Take 3 tablets (450 mg total) by mouth daily.  . [DISCONTINUED] carbidopa-levodopa (SINEMET) 25-100 MG tablet Take 1 tablet by mouth 3 (three) times daily.  . [DISCONTINUED] gabapentin (NEURONTIN) 300 MG capsule Take 1 capsule (300 mg total) by mouth 3 (three) times daily.  . [DISCONTINUED] metoprolol tartrate (LOPRESSOR) 25 MG tablet Take 1 tablet (25 mg total) by mouth 2 (two) times daily.  . [DISCONTINUED] pantoprazole (PROTONIX) 40 MG tablet Take 1 tablet (40 mg total) by mouth daily.  . [DISCONTINUED] sertraline (ZOLOFT) 100 MG tablet Take 2 tablets (200 mg total) by mouth daily.  . [DISCONTINUED] tiZANidine (ZANAFLEX) 2 MG tablet Take 1 tablet (2 mg total) by mouth 2 (two) times daily. QAM and QHS  . [DISCONTINUED] traZODone (DESYREL) 50 MG tablet Take 50 mg by mouth at bedtime.   . [DISCONTINUED] lactose free nutrition (BOOST) LIQD Take 237 mLs by mouth 2 (two) times daily between meals.   No facility-administered encounter medications on file as of 07/04/2020.     Review of Systems  Constitutional: Negative for appetite change, chills, fatigue and fever.  HENT: Negative for congestion, rhinorrhea, sinus pressure, sinus pain, sneezing and sore throat.   Eyes: Negative for pain, discharge, redness and itching.  Respiratory: Negative for cough, chest tightness, shortness of breath and wheezing.   Cardiovascular: Negative for chest pain, palpitations and leg swelling.    Gastrointestinal: Negative for abdominal distention, abdominal pain, constipation, diarrhea, nausea and vomiting.  Endocrine: Negative for cold intolerance, heat intolerance, polydipsia, polyphagia and polyuria.  Genitourinary: Negative for  difficulty urinating, dysuria, flank pain, frequency and urgency.  Musculoskeletal: Positive for gait problem. Negative for joint swelling and myalgias.  Skin: Negative for color change, pallor and rash.  Neurological: Positive for tremors. Negative for dizziness, seizures, speech difficulty, light-headedness and headaches.       Chronic numbness and tingling on feet   Hematological: Does not bruise/bleed easily.  Psychiatric/Behavioral: Negative for agitation, confusion, hallucinations and sleep disturbance. The patient is not nervous/anxious.     Vitals:   07/04/20 1213  BP: 132/64  Pulse: 61  Resp: 18  Temp: (!) 97 F (36.1 C)  Weight: 113 lb (51.3 kg)  Height:  (1.6 m)   Body mass index is 20.02 kg/m. Physical Exam Vitals and nursing note reviewed.  Constitutional:      General: She is not in acute distress.    Appearance: She is not ill-appearing.  HENT:     Head: Normocephalic.     Nose: Nose normal. No congestion or rhinorrhea.     Mouth/Throat:     Mouth: Mucous membranes are moist.     Pharynx: Oropharynx is clear. No oropharyngeal exudate or posterior oropharyngeal erythema.  Eyes:     General: No scleral icterus.       Right eye: No discharge.        Left eye: No discharge.     Conjunctiva/sclera: Conjunctivae normal.     Pupils: Pupils are equal, round, and reactive to light.  Cardiovascular:     Rate and Rhythm: Normal rate and regular rhythm.     Pulses: Normal pulses.     Heart sounds: Normal heart sounds. No murmur heard.  No friction rub. No gallop.   Pulmonary:     Effort: Pulmonary effort is normal. No respiratory distress.     Breath sounds: Normal breath sounds. No wheezing, rhonchi or rales.  Chest:      Chest wall: No tenderness.  Abdominal:     General: Bowel sounds are normal. There is no distension.     Palpations: Abdomen is soft. There is no mass.     Tenderness: There is no abdominal tenderness. There is no right CVA tenderness, left CVA tenderness, guarding or rebound.  Musculoskeletal:        General: No swelling or tenderness.     Cervical back: No rigidity or tenderness.     Right lower leg: No edema.     Left lower leg: No edema.     Comments: Unsteady gait   Lymphadenopathy:     Cervical: No cervical adenopathy.  Skin:    General: Skin is warm and dry.     Coloration: Skin is not pale.     Findings: No bruising, erythema or rash.     Comments: Previous hip incision healed.   Neurological:     Mental Status: She is alert and oriented to person, place, and time.     Cranial Nerves: No cranial nerve deficit.     Motor: Tremor present. No weakness.     Coordination: Coordination normal.     Gait: Gait abnormal.  Psychiatric:        Mood and Affect: Mood normal.        Behavior: Behavior normal.        Thought Content: Thought content normal.        Judgment: Judgment normal.     Labs reviewed: Basic Metabolic Panel: Recent Labs    04/01/20 0333 04/01/20 0333 04/02/20 0310 04/04/20 0000 05/21/20 0252 05/21/20  3235 05/22/20 0302 05/23/20 0303 05/29/20 0000 06/14/20 2000  NA 140   < > 140   < > 140   < >  --  140 142 139  K 4.5   < > 4.7   < > 4.1   < >  --  3.8 4.5 3.7  CL 104   < > 108   < > 103   < >  --  103 106 105  CO2 29   < > 22   < > 29   < >  --  29 25* 21*  GLUCOSE 101*   < > 105*   < > 113*  --   --  106*  --  83  BUN 29*   < > 34*   < > 16   < >  --  18 13 14   CREATININE 0.93   < > 0.71   < > 0.61   < >  --  0.65 0.6 0.80  CALCIUM 8.7*   < > 9.0   < > 8.9   < >  --  8.5* 9.0 9.2  MG 2.1   < > 2.0  --  1.7  --  2.0 1.8  --   --   PHOS 4.8*  --  4.4  --   --   --   --  3.1  --   --    < > = values in this interval not displayed.   Liver  Function Tests: Recent Labs    05/19/20 0119 05/23/20 0303 06/14/20 2000  AST 45* 19 22  ALT 48* 9 26  ALKPHOS 124 88 85  BILITOT 1.1 0.4 0.9  PROT 7.9 6.0* 7.2  ALBUMIN 4.1 3.2* 4.0    Recent Labs    05/20/20 0323  AMMONIA 27   CBC: Recent Labs    04/04/20 0000 05/19/20 0119 05/21/20 0252 05/21/20 0252 05/23/20 0303 05/29/20 0000 06/14/20 2000  WBC 7.2   < > 5.2   < > 5.0 4.8 8.7  NEUTROABS 4  --   --   --  2.5  --  6.3  HGB 10.4*   < > 10.9*   < > 10.5* 10.9* 11.4*  HCT 30*   < > 35.0*   < > 33.9* 33* 36.2  MCV  --    < > 91.1  --  91.6  --  89.8  PLT 374   < > 341   < > 297 348 393   < > = values in this interval not displayed.   Cardiac Enzymes: Recent Labs    05/19/20 0119 05/20/20 0323  CKTOTAL 245* 88   CBG: Recent Labs    05/19/20 0051 05/19/20 0639  GLUCAP 120* 83    Procedures and Imaging Studies During Stay: No results found.  Assessment/Plan:     1. Senile osteoporosis - No recent Bone density for review.Has had Frequent falls.continue on alendronate,Vit D supplement and MVI. - alendronate (FOSAMAX) 70 MG tablet; Take 1 tablet (70 mg total) by mouth once a week. Take with 8 oz of water 30 minutes prior to food/meds. Do not lie down for 30 minutes.  Dispense: 4 tablet; Refill: 0  2. Current mild episode of major depressive disorder without prior episode (HCC) Mood stable.continue on Bupropion,sertraline,and Abilify  - ARIPiprazole (ABILIFY) 10 MG tablet; Take 1 tablet (10 mg total) by mouth daily.  Dispense: 30 tablet; Refill: 0 - buPROPion (WELLBUTRIN XL) 150 MG 24  hr tablet; Take 3 tablets (450 mg total) by mouth daily.  Dispense: 90 tablet; Refill: 0 - sertraline (ZOLOFT) 100 MG tablet; Take 2 tablets (200 mg total) by mouth daily.  Dispense: 60 tablet; Refill: 0  3. Polyneuropathy - could be a contributing to her falls.Continue on Gabapentin. - gabapentin (NEURONTIN) 300 MG capsule; Take 1 capsule (300 mg total) by mouth 3 (three)  times daily.  Dispense: 90 capsule; Refill: 0  4. Essential hypertension B/p controlled.continue on metoprolol  - metoprolol tartrate (LOPRESSOR) 25 MG tablet; Take 1 tablet (25 mg total) by mouth 2 (two) times daily.  Dispense: 60 tablet; Refill: 0  5. Gastroesophageal reflux disease without esophagitis Symptoms stable.continue on Protonix  - pantoprazole (PROTONIX) 40 MG tablet; Take 1 tablet (40 mg total) by mouth daily.  Dispense: 30 tablet; Refill: 0  6. Closed nondisplaced fracture of first cervical vertebra with routine healing, unspecified fracture morphology, subsequent encounter Healed.pain under control.continue on Tizanidine and tylenol. - tiZANidine (ZANAFLEX) 2 MG tablet; Take 1 tablet (2 mg total) by mouth 2 (two) times daily. QAM and QHS  Dispense: 60 tablet; Refill: 0  7. Periprosthetic fracture of hip, subsequent encounter Continue on tylenol as needed.  - tiZANidine (ZANAFLEX) 2 MG tablet; Take 1 tablet (2 mg total) by mouth 2 (two) times daily. QAM and QHS  Dispense: 60 tablet; Refill: 0  8. Unsteady gait Has worked well with PT/ OT but remains high risk for falls and readmission. Will discharge to [facility] with PT/OT to continue with ROM, Exercise, Gait stability and muscle strengthening.No DME required has own Rolling walker and Rolator.Fall and safety precautions.   9. Failure to thrive in adult States post hospital admission as above.will discharge to higher level of care at [facility] for closer supervision.she is high risk for readmission.   10. Insomnia, unspecified type Continue on Melatonin and Trazodone.  - traZODone (DESYREL) 50 MG tablet; Take 1 tablet (50 mg total) by mouth at bedtime.  Dispense: 30 tablet; Refill: 0  11. Recurrent falls Discharge with home health PT/OT for ROM,exercise and muscle strengthening. - D/C to [facility] for close supervision.  12. Mixed hyperlipidemia No latest LDL for review.will defer to PCP to  check Lipid panel then consider decreasing atorvastatin due to her multiple falls and failure to thrive.  - atorvastatin (LIPITOR) 40 MG tablet; Take 1 tablet (40 mg total) by mouth daily.  Dispense: 30 tablet; Refill: 0  13. Parkinsonism, unspecified Parkinsonism type (HCC) Has fine tremors.continue on Sinemet  - continue to follow up with Neurology  - carbidopa-levodopa (SINEMET) 25-100 MG tablet; Take 1 tablet by mouth 3 (three) times daily.  Dispense: 90 tablet; Refill: 0   Patient is being discharged with the following home health services:   -PT/OT for ROM, exercise, gait stability and muscle strengthening   Patient is being discharged with the following durable medical equipment:   - None has own DME   Patient has been advised to f/u with their PCP in 1-2 weeks to for a transitions of care visit.Social services at their facility was responsible for arranging this appointment.  Pt was provided with adequate prescriptions of noncontrolled medications to reach the scheduled appointment.For controlled substances, a limited supply was provided as appropriate for the individual patient. If the pt normally receives these medications from a pain clinic or has a contract with another physician, these medications should be received from that clinic or physician only).    Future labs/tests  needed:  CBC, BMP in 1-2 weeks PCP

## 2020-07-11 ENCOUNTER — Other Ambulatory Visit: Payer: Self-pay | Admitting: Family Medicine

## 2020-07-11 DIAGNOSIS — S0990XA Unspecified injury of head, initial encounter: Secondary | ICD-10-CM

## 2020-07-24 ENCOUNTER — Encounter: Payer: Self-pay | Admitting: Adult Health

## 2020-07-24 ENCOUNTER — Ambulatory Visit: Payer: Medicare Other | Admitting: Adult Health

## 2020-07-28 ENCOUNTER — Other Ambulatory Visit: Payer: Self-pay | Admitting: Internal Medicine

## 2020-08-15 ENCOUNTER — Ambulatory Visit: Payer: Medicare Other

## 2020-08-20 ENCOUNTER — Ambulatory Visit: Payer: Medicare Other | Attending: Internal Medicine

## 2020-08-20 DIAGNOSIS — Z23 Encounter for immunization: Secondary | ICD-10-CM

## 2020-08-20 NOTE — Progress Notes (Signed)
   Covid-19 Vaccination Clinic  Name:  Marylou Wages    MRN: [id number] DOB: July 31, 1946  08/20/2020  Ms. Cabanilla was observed post Covid-19 immunization for 15 minutes without incident. She was provided with Vaccine Information Sheet and instruction to access the V-Safe system.   Ms. Cressey was instructed to call 911 with any severe reactions post vaccine: Marland Kitchen Difficulty breathing  . Swelling of face and throat  . A fast heartbeat  . A bad rash all over body  . Dizziness and weakness

## 2020-09-02 ENCOUNTER — Telehealth: Payer: Self-pay | Admitting: Physician Assistant

## 2020-09-02 NOTE — Telephone Encounter (Signed)
Dana [name]'s son, Dana [name], called just to give an update on his Mom's condition.  This is really just for the record so we are aware if they schedule her a follow up appointment.  Dana [name] is presently in respite care at [facility] her in [place].  She is there because she has had several falls over the last year and recently was sent to [facility] after a fall and visit to the hospital.  They released her but she was not strong enough to be able to go home so they put her at [facility] to continue occupational and physical therapy until she gains to strength to go home without fear of her falling again and repeating the process.  But then last night she called Dana [name] and told him someone had gone her purse and spent money from her [employer] card.  She also told him that she had also called Dana [name] to tell him.  He was like well I am Dana [name].  Dana [name] said she thought she was calling her husband who passed in 1989.  He is in [place]. But has been done recently to prepare her house for her return so he has spent time with recently and is concerned about her mental health.  Apparently [facility] is managing her medications.  He knows we can't really do anything unless she follows up with treatment but wanted [place] to be aware of her status.

## 2020-09-02 NOTE — Telephone Encounter (Signed)
Reviewed.  I appreciate the update.

## 2020-09-21 ENCOUNTER — Emergency Department (HOSPITAL_COMMUNITY)
Admission: EM | Admit: 2020-09-21 | Discharge: 2020-09-22 | Disposition: A | Discharge status: Home / Self Care | Payer: Medicare Other | Attending: Emergency Medicine | Admitting: Emergency Medicine

## 2020-09-21 ENCOUNTER — Other Ambulatory Visit: Payer: Self-pay

## 2020-09-21 ENCOUNTER — Emergency Department (HOSPITAL_COMMUNITY): Payer: Medicare Other

## 2020-09-21 DIAGNOSIS — Z7982 Long term (current) use of aspirin: Secondary | ICD-10-CM | POA: Insufficient documentation

## 2020-09-21 DIAGNOSIS — Y92015 Private garage of single-family (private) house as the place of occurrence of the external cause: Secondary | ICD-10-CM | POA: Insufficient documentation

## 2020-09-21 DIAGNOSIS — W19XXXD Unspecified fall, subsequent encounter: Secondary | ICD-10-CM

## 2020-09-21 DIAGNOSIS — M25562 Pain in left knee: Secondary | ICD-10-CM | POA: Diagnosis not present

## 2020-09-21 DIAGNOSIS — I10 Essential (primary) hypertension: Secondary | ICD-10-CM | POA: Diagnosis not present

## 2020-09-21 DIAGNOSIS — Z79899 Other long term (current) drug therapy: Secondary | ICD-10-CM | POA: Insufficient documentation

## 2020-09-21 DIAGNOSIS — M25512 Pain in left shoulder: Secondary | ICD-10-CM

## 2020-09-21 DIAGNOSIS — S0990XA Unspecified injury of head, initial encounter: Secondary | ICD-10-CM | POA: Insufficient documentation

## 2020-09-21 DIAGNOSIS — W050XXD Fall from non-moving wheelchair, subsequent encounter: Secondary | ICD-10-CM | POA: Insufficient documentation

## 2020-09-21 DIAGNOSIS — Z23 Encounter for immunization: Secondary | ICD-10-CM | POA: Diagnosis not present

## 2020-09-21 DIAGNOSIS — S0181XA Laceration without foreign body of other part of head, initial encounter: Secondary | ICD-10-CM | POA: Diagnosis not present

## 2020-09-21 DIAGNOSIS — Z96642 Presence of left artificial hip joint: Secondary | ICD-10-CM | POA: Insufficient documentation

## 2020-09-21 MED ORDER — TETANUS-DIPHTH-ACELL PERTUSSIS 5-2.5-18.5 LF-MCG/0.5 IM SUSY
0.5000 mL | PREFILLED_SYRINGE | Freq: Once | INTRAMUSCULAR | Status: AC
  Administered 2020-09-21: 0.5 mL via INTRAMUSCULAR
  Filled 2020-09-21: qty 0.5

## 2020-09-21 MED ORDER — LIDOCAINE-EPINEPHRINE (PF) 2 %-1:200000 IJ SOLN
10.0000 mL | Freq: Once | INTRAMUSCULAR | Status: AC
  Administered 2020-09-21: 10 mL
  Filled 2020-09-21: qty 20

## 2020-09-21 NOTE — ED Notes (Signed)
Patient transported to X-ray 

## 2020-09-21 NOTE — Discharge Instructions (Addendum)
You were seen in the emergency department for evaluation of injuries after a fall.  You had a CAT scan of your head and cervical spine along with x-rays of your left shoulder and left knee.  These did not show any acute traumatic findings.  You had a laceration to your left forehead that was sutured and the sutures will need to be removed in 5 to 7 days.  Soap and water.  Tylenol for pain.  Return sooner if any signs of infection.

## 2020-09-21 NOTE — ED Notes (Signed)
[facility] called and transport arranged.  

## 2020-09-21 NOTE — ED Provider Notes (Signed)
[name]-EMERGENCY DEPT Provider Note   CSN: [id number] Arrival date & time: 09/21/20  2055     History Chief Complaint  Patient presents with  . Fall    Dana [name] is a 74 y.o. female.  She is a resident at [facility].  Reportedly she fell out of her wheelchair and struck her head on something.  When I asked her why she fell she said she was probably doing something she should not have.  History of Parkinson's and falls.  Not on blood thinners.  Denies any head or neck pain.  Does complain of some left shoulder pain and left knee pain.  No numbness or weakness.  No blurry vision double vision chest pain shortness of breath abdominal pain  The history is provided by the patient.  Fall The current episode started 1 to 2 hours ago. The problem has not changed since onset.Pertinent negatives include no chest pain, no abdominal pain, no headaches and no shortness of breath. Associated symptoms comments: Left shoulder and left knee. The symptoms are aggravated by bending. The symptoms are relieved by position. She has tried rest for the symptoms. The treatment provided moderate relief.       Past Medical History:  Diagnosis Date  . Anemia    none since hysterectomy  . Arthritis    LlTHA 11'15(had a fall)-"remains tender"  . Depression   . Heart murmur    history of  . Hyperlipidemia   . Hypertension   . Parkinson's disease (HCC)   . Stroke [facility])    4-5 yrs ago(left sided weakness) -no residual  . Transfusion history    age 77- "hysterectomy"    Patient Active Problem List   Diagnosis Date Noted  . Gastroesophageal reflux disease without esophagitis 07/04/2020  . Senile osteoporosis 07/04/2020  . Recurrent falls 07/04/2020  . Parkinsonism (HCC) 07/04/2020  . Unsteady gait 07/04/2020  . Pressure injury of skin 06/14/2020  . Acute metabolic encephalopathy 05/20/2020  . Confusion 05/19/2020  . Generalized weakness 05/19/2020  . Peri-prosthetic  fracture around prosthetic hip   . Fall 03/27/2020  . Failure to thrive in adult 11/23/2019  . Fall at home 11/23/2019  . Parkinsonian features 11/23/2019  . Hoarding behavior 11/23/2019  . Polyneuropathy 10/04/2019  . Insomnia 09/07/2018  . Major depressive disorder 07/25/2018  . GAD (generalized anxiety disorder) 07/25/2018  . Hip fracture, left (HCC) 09/15/2014  . Chronic back pain 09/15/2014  . History of stroke   . Hyperlipidemia   . Essential hypertension   . Spastic hemiplegia affecting nondominant side (HCC) 01/12/2012    Past Surgical History:  Procedure Laterality Date  . ABDOMINAL HYSTERECTOMY    . COLONOSCOPY WITH PROPOFOL N/A 12/25/2014   Procedure: COLONOSCOPY WITH PROPOFOL;  Surgeon: Charolett Bumpers, MD;  Location: [facility];  Service: Endoscopy;  Laterality: N/A;  . FRACTURE SURGERY  01/22/12   fx of eye socket from fall-"retained plate"  . HAND SURGERY Left    nerve release -many yrs ago  . HIP ARTHROPLASTY Left 09/16/2014   Procedure: ARTHROPLASTY BIPOLAR HIP;  Surgeon: Sheral Apley, MD;  Location: [facility] OR;  Service: Orthopedics;  Laterality: Left;     OB History   No obstetric history on file.     Family History  Problem Relation Age of Onset  . Hyperlipidemia Mother   . Hypertension Mother   . Diabetes Mother   . Heart disease Mother        aortic vavle  replaced  . Hypertension Father   . Hyperlipidemia Father   . Heart disease Father        aortic valve replaced  . Alzheimer's disease Father     Social History   Tobacco Use  . Smoking status: Never Smoker  . Smokeless tobacco: Never Used  Vaping Use  . Vaping Use: Never used  Substance Use Topics  . Alcohol use: Yes    Comment: occasional beer  . Drug use: Never    Home Medications Prior to Admission medications   Medication Sig Start Date End Date Taking? Authorizing Provider  acetaminophen (TYLENOL) 500 MG tablet Take 2 tablets (1,000 mg total) by mouth every 8 (eight) hours  as needed. 04/02/20   Zigmund DanielPowell, A Caldwell Jr., MD  alendronate (FOSAMAX) 70 MG tablet Take 1 tablet (70 mg total) by mouth once a week. Take with 8 oz of water 30 minutes prior to food/meds. Do not lie down for 30 minutes. 07/04/20   Ngetich, Dinah C, NP  ARIPiprazole (ABILIFY) 10 MG tablet Take 1 tablet (10 mg total) by mouth daily. 07/04/20   Ngetich, Dinah C, NP  aspirin 81 MG chewable tablet Chew 81 mg by mouth daily.    [provider]  atorvastatin (LIPITOR) 40 MG tablet Take 1 tablet (40 mg total) by mouth daily. 07/04/20   Ngetich, Dinah C, NP  buPROPion (WELLBUTRIN XL) 150 MG 24 hr tablet Take 3 tablets (450 mg total) by mouth daily. 07/04/20   Ngetich, Dinah C, NP  carbidopa-levodopa (SINEMET) 25-100 MG tablet Take 1 tablet by mouth 3 (three) times daily. 07/04/20   Ngetich, Dinah C, NP  Cholecalciferol (VITAMIN D) 50 MCG (2000 UT) CAPS Take 2,000 Units by mouth daily.     [provider]  gabapentin (NEURONTIN) 300 MG capsule Take 1 capsule (300 mg total) by mouth 3 (three) times daily. 07/04/20   Ngetich, Dinah C, NP  melatonin 5 MG TABS Take 5 mg by mouth at bedtime.     [provider]  metoprolol tartrate (LOPRESSOR) 25 MG tablet Take 1 tablet (25 mg total) by mouth 2 (two) times daily. 07/04/20   Ngetich, Dinah C, NP  Multiple Vitamin (MULTIVITAMIN) tablet Take 1 tablet by mouth daily.    [provider]  pantoprazole (PROTONIX) 40 MG tablet Take 1 tablet (40 mg total) by mouth daily. 07/04/20   Ngetich, Dinah C, NP  sertraline (ZOLOFT) 100 MG tablet Take 2 tablets (200 mg total) by mouth daily. 07/04/20   Ngetich, Dinah C, NP  tiZANidine (ZANAFLEX) 2 MG tablet Take 1 tablet (2 mg total) by mouth 2 (two) times daily. QAM and QHS 07/04/20   Ngetich, Dinah C, NP  traZODone (DESYREL) 50 MG tablet Take 1 tablet (50 mg total) by mouth at bedtime. 07/04/20   Ngetich, Donalee Citrininah C, NP    Allergies    Percodan [oxycodone-aspirin] and Oxycodone  Review of Systems     Review of Systems  Constitutional: Negative for fever.  HENT: Negative for sore throat.   Eyes: Negative for visual disturbance.  Respiratory: Negative for shortness of breath.   Cardiovascular: Negative for chest pain.  Gastrointestinal: Negative for abdominal pain.  Genitourinary: Negative for dysuria.  Musculoskeletal: Negative for neck pain.  Skin: Positive for wound.  Neurological: Negative for headaches.    Physical Exam Updated Vital Signs BP (!) 148/73 (BP Location: Left Arm)   Pulse 62   Temp 97.9 F (36.6 C) (Oral)   Resp 15  Ht 5\' 3"  (1.6 m)   Wt 51.3 kg   SpO2 99%   BMI 20.02 kg/m   Physical Exam Vitals and nursing note reviewed.  Constitutional:      General: She is not in acute distress.    Appearance: Normal appearance. She is well-developed.  HENT:     Head: Normocephalic.     Comments: She has a 3 cm laceration above her left eye Eyes:     Conjunctiva/sclera: Conjunctivae normal.  Neck:     Comments: C-collar in place Cardiovascular:     Rate and Rhythm: Normal rate and regular rhythm.     Heart sounds: No murmur heard.   Pulmonary:     Effort: Pulmonary effort is normal. No respiratory distress.     Breath sounds: Normal breath sounds.  Abdominal:     Palpations: Abdomen is soft.     Tenderness: There is no abdominal tenderness.  Musculoskeletal:        General: Tenderness present. No deformity.     Cervical back: No tenderness.     Comments: She has some diffuse tenderness over her left shoulder and her left knee.  Normal landmarks.  Full range of motion of right upper and lower extremity without any pain or limitations.  No neck or back pain.  Neck in cervical collar.  Skin:    General: Skin is warm and dry.  Neurological:     General: No focal deficit present.     Mental Status: She is alert. Mental status is at baseline.     ED Results / Procedures / Treatments   Labs (all labs ordered are listed, but only abnormal results are  displayed) Labs Reviewed - No data to display  EKG None  Radiology CT Head Wo Contrast  Result Date: 09/21/2020 CLINICAL DATA:  74 year old female with head trauma. EXAM: CT HEAD WITHOUT CONTRAST CT CERVICAL SPINE WITHOUT CONTRAST TECHNIQUE: Multidetector CT imaging of the head and cervical spine was performed following the standard protocol without intravenous contrast. Multiplanar CT image reconstructions of the cervical spine were also generated. COMPARISON:  Cervical spine CT dated 05/20/2020 and head CT dated 05/19/2020. FINDINGS: CT HEAD FINDINGS Brain: Mild age-related atrophy and chronic microvascular ischemic changes. There is no acute intracranial hemorrhage. No mass effect midline shift no extra-axial fluid collection. Vascular: No hyperdense vessel or unexpected calcification. Skull: Normal. Negative for fracture or focal lesion. Sinuses/Orbits: No acute finding. Other: Left forehead contusion. CT CERVICAL SPINE FINDINGS Alignment: No acute subluxation. There is grade 1 C7-T1 and T1-T2 anterolisthesis. Skull base and vertebrae: No acute fracture. Nondisplaced fracture of the anterior ring of C1 again noted with sclerotic changes of the fracture margin. No significant interval healing at this time. Soft tissues and spinal canal: No prevertebral fluid or swelling. No visible canal hematoma. Disc levels:  Multilevel degenerative changes primarily at C6-C7. Upper chest: Emphysema. Other: Bilateral carotid bulb calcified plaques. IMPRESSION: 1. No acute intracranial pathology. Mild age-related atrophy and chronic microvascular ischemic changes. 2. No acute/traumatic cervical spine pathology. 3. Nondisplaced fracture of the anterior ring of C1 with sclerotic changes of the fracture margin. No significant interval healing at this time. Electronically Signed   By: 07/20/2020 M.D.   On: 09/21/2020 22:59   CT Cervical Spine Wo Contrast  Result Date: 09/21/2020 CLINICAL DATA:  74 year old  female with head trauma. EXAM: CT HEAD WITHOUT CONTRAST CT CERVICAL SPINE WITHOUT CONTRAST TECHNIQUE: Multidetector CT imaging of the head and cervical spine was performed following the  standard protocol without intravenous contrast. Multiplanar CT image reconstructions of the cervical spine were also generated. COMPARISON:  Cervical spine CT dated 05/20/2020 and head CT dated 05/19/2020. FINDINGS: CT HEAD FINDINGS Brain: Mild age-related atrophy and chronic microvascular ischemic changes. There is no acute intracranial hemorrhage. No mass effect midline shift no extra-axial fluid collection. Vascular: No hyperdense vessel or unexpected calcification. Skull: Normal. Negative for fracture or focal lesion. Sinuses/Orbits: No acute finding. Other: Left forehead contusion. CT CERVICAL SPINE FINDINGS Alignment: No acute subluxation. There is grade 1 C7-T1 and T1-T2 anterolisthesis. Skull base and vertebrae: No acute fracture. Nondisplaced fracture of the anterior ring of C1 again noted with sclerotic changes of the fracture margin. No significant interval healing at this time. Soft tissues and spinal canal: No prevertebral fluid or swelling. No visible canal hematoma. Disc levels:  Multilevel degenerative changes primarily at C6-C7. Upper chest: Emphysema. Other: Bilateral carotid bulb calcified plaques. IMPRESSION: 1. No acute intracranial pathology. Mild age-related atrophy and chronic microvascular ischemic changes. 2. No acute/traumatic cervical spine pathology. 3. Nondisplaced fracture of the anterior ring of C1 with sclerotic changes of the fracture margin. No significant interval healing at this time. Electronically Signed   By: Elgie Collard M.D.   On: 09/21/2020 22:59   DG Shoulder Left  Result Date: 09/21/2020 CLINICAL DATA:  Larey Seat from wheelchair, pain EXAM: LEFT SHOULDER - 2+ VIEW COMPARISON:  03/26/2020 FINDINGS: Frontal and transscapular views of the left shoulder demonstrate no fractures. Alignment  is anatomic. Stable mild osteoarthritis of the acromioclavicular and glenohumeral joints. The left chest is clear. IMPRESSION: 1. Stable osteoarthritis.  No acute fracture. Electronically Signed   By: Sharlet Salina M.D.   On: 09/21/2020 22:35   DG Knee Complete 4 Views Left  Result Date: 09/21/2020 CLINICAL DATA:  Larey Seat from wheelchair, left knee pain EXAM: LEFT KNEE - COMPLETE 4+ VIEW COMPARISON:  None. FINDINGS: Frontal, bilateral oblique, lateral views of the left knee are obtained. No fracture, subluxation, or dislocation. Mild medial compartmental osteoarthritis. No joint effusion. IMPRESSION: 1. Mild osteoarthritis.  No acute fracture. Electronically Signed   By: Sharlet Salina M.D.   On: 09/21/2020 22:36    Procedures .Marland KitchenLaceration Repair  Date/Time: 09/21/2020 10:47 PM Performed by: Terrilee Files, MD Authorized by: Terrilee Files, MD   Consent:    Consent obtained:  Verbal   Consent given by:  Patient   Risks discussed:  Infection, pain, poor cosmetic result, poor wound healing and retained foreign body   Alternatives discussed:  No treatment and delayed treatment Anesthesia (see MAR for exact dosages):    Anesthesia method:  Local infiltration   Local anesthetic:  Lidocaine 2% WITH epi Laceration details:    Location:  Face   Face location:  L eyebrow   Length (cm):  4 Repair type:    Repair type:  Simple Pre-procedure details:    Preparation:  Patient was prepped and draped in usual sterile fashion Treatment:    Area cleansed with:  Saline   Irrigation solution:  Sterile saline Skin repair:    Repair method:  Sutures   Suture size:  5-0   Suture material:  Nylon   Suture technique:  Simple interrupted   Number of sutures:  5 Approximation:    Approximation:  Close Post-procedure details:    Dressing:  Antibiotic ointment and sterile dressing   Patient tolerance of procedure:  Tolerated well, no immediate complications   (including critical care  time)  Medications Ordered in ED Medications  Tdap (BOOSTRIX) injection 0.5 mL (has no administration in time range)  lidocaine-EPINEPHrine (XYLOCAINE W/EPI) 2 %-1:200000 (PF) injection 10 mL (has no administration in time range)    ED Course  I have reviewed the triage vital signs and the nursing notes.  Pertinent labs & imaging results that were available during my care of the patient were reviewed by me and considered in my medical decision making (see chart for details).  Clinical Course as of Sep 21 2245  Sat Sep 21, 2020  2224 Left shoulder x-ray shows no fracture or dislocation.   [MB]  2225 Left knee x-ray shows no fracture or dislocation.   [MB]    Clinical Course User Index [MB] Terrilee Files, MD   MDM Rules/Calculators/A&P                         74 year old female here from her facility after falling out of her wheelchair. History of Parkinson's and gait dysfunction. No particular complaints but on exam has laceration left eyebrow and some tenderness left shoulder and left knee. Head and cervical spine CT ordered due to mechanism and head trauma, x-rays of shoulder and knee ordered due to tenderness. Those images were interpreted by me as no acute findings. Wound was repaired. Patient to be returned to her facility where they can continue to observe her. Differential includes skull fracture, intercranial bleed, C-spine fracture, contusion.  Final Clinical Impression(s) / ED Diagnoses Final diagnoses:  Injury of head, initial encounter  Fall, subsequent encounter  Facial laceration, initial encounter  Acute pain of left shoulder  Acute pain of left knee    Rx / DC Orders ED Discharge Orders    None       Terrilee Files, MD 09/22/20 [phone]

## 2020-09-21 NOTE — ED Triage Notes (Signed)
Pt to ER via EMS after susutaining a fall from her wheelchair.  Pt is a resident at [facility] and fell forward out of her wheelchair PTA.  Pt struck her head on unknown object.  Pt denies any LOC.  No use of blood thinners.  PT A&O at this time.  Bleeding controlled.  NADN.

## 2020-11-15 DIAGNOSIS — Z9181 History of falling: Secondary | ICD-10-CM | POA: Diagnosis not present

## 2020-11-15 DIAGNOSIS — M810 Age-related osteoporosis without current pathological fracture: Secondary | ICD-10-CM | POA: Diagnosis not present

## 2020-11-15 DIAGNOSIS — Z96642 Presence of left artificial hip joint: Secondary | ICD-10-CM | POA: Diagnosis not present

## 2020-11-15 DIAGNOSIS — I69354 Hemiplegia and hemiparesis following cerebral infarction affecting left non-dominant side: Secondary | ICD-10-CM | POA: Diagnosis not present

## 2020-11-15 DIAGNOSIS — G20 Parkinson's disease: Secondary | ICD-10-CM | POA: Diagnosis not present

## 2020-11-15 DIAGNOSIS — G629 Polyneuropathy, unspecified: Secondary | ICD-10-CM | POA: Diagnosis not present

## 2020-11-15 DIAGNOSIS — F32A Depression, unspecified: Secondary | ICD-10-CM | POA: Diagnosis not present

## 2020-11-15 DIAGNOSIS — G4700 Insomnia, unspecified: Secondary | ICD-10-CM | POA: Diagnosis not present

## 2020-11-15 DIAGNOSIS — Z7982 Long term (current) use of aspirin: Secondary | ICD-10-CM | POA: Diagnosis not present

## 2020-11-15 DIAGNOSIS — E785 Hyperlipidemia, unspecified: Secondary | ICD-10-CM | POA: Diagnosis not present

## 2020-11-15 DIAGNOSIS — I10 Essential (primary) hypertension: Secondary | ICD-10-CM | POA: Diagnosis not present

## 2020-11-20 DIAGNOSIS — I10 Essential (primary) hypertension: Secondary | ICD-10-CM | POA: Diagnosis not present

## 2020-11-20 DIAGNOSIS — I69354 Hemiplegia and hemiparesis following cerebral infarction affecting left non-dominant side: Secondary | ICD-10-CM | POA: Diagnosis not present

## 2020-11-20 DIAGNOSIS — Z96642 Presence of left artificial hip joint: Secondary | ICD-10-CM | POA: Diagnosis not present

## 2020-11-20 DIAGNOSIS — E785 Hyperlipidemia, unspecified: Secondary | ICD-10-CM | POA: Diagnosis not present

## 2020-11-20 DIAGNOSIS — M810 Age-related osteoporosis without current pathological fracture: Secondary | ICD-10-CM | POA: Diagnosis not present

## 2020-11-20 DIAGNOSIS — Z7982 Long term (current) use of aspirin: Secondary | ICD-10-CM | POA: Diagnosis not present

## 2020-11-20 DIAGNOSIS — G4700 Insomnia, unspecified: Secondary | ICD-10-CM | POA: Diagnosis not present

## 2020-11-20 DIAGNOSIS — F32A Depression, unspecified: Secondary | ICD-10-CM | POA: Diagnosis not present

## 2020-11-20 DIAGNOSIS — Z9181 History of falling: Secondary | ICD-10-CM | POA: Diagnosis not present

## 2020-11-20 DIAGNOSIS — G629 Polyneuropathy, unspecified: Secondary | ICD-10-CM | POA: Diagnosis not present

## 2020-11-20 DIAGNOSIS — G20 Parkinson's disease: Secondary | ICD-10-CM | POA: Diagnosis not present

## 2020-11-27 DIAGNOSIS — Z96642 Presence of left artificial hip joint: Secondary | ICD-10-CM | POA: Diagnosis not present

## 2020-11-27 DIAGNOSIS — E785 Hyperlipidemia, unspecified: Secondary | ICD-10-CM | POA: Diagnosis not present

## 2020-11-27 DIAGNOSIS — M810 Age-related osteoporosis without current pathological fracture: Secondary | ICD-10-CM | POA: Diagnosis not present

## 2020-11-27 DIAGNOSIS — G20 Parkinson's disease: Secondary | ICD-10-CM | POA: Diagnosis not present

## 2020-11-27 DIAGNOSIS — F32A Depression, unspecified: Secondary | ICD-10-CM | POA: Diagnosis not present

## 2020-11-27 DIAGNOSIS — I69354 Hemiplegia and hemiparesis following cerebral infarction affecting left non-dominant side: Secondary | ICD-10-CM | POA: Diagnosis not present

## 2020-11-27 DIAGNOSIS — Z9181 History of falling: Secondary | ICD-10-CM | POA: Diagnosis not present

## 2020-11-27 DIAGNOSIS — Z7982 Long term (current) use of aspirin: Secondary | ICD-10-CM | POA: Diagnosis not present

## 2020-11-27 DIAGNOSIS — G629 Polyneuropathy, unspecified: Secondary | ICD-10-CM | POA: Diagnosis not present

## 2020-11-27 DIAGNOSIS — I10 Essential (primary) hypertension: Secondary | ICD-10-CM | POA: Diagnosis not present

## 2020-11-27 DIAGNOSIS — G4700 Insomnia, unspecified: Secondary | ICD-10-CM | POA: Diagnosis not present

## 2020-11-28 DIAGNOSIS — G629 Polyneuropathy, unspecified: Secondary | ICD-10-CM | POA: Diagnosis not present

## 2020-11-28 DIAGNOSIS — Z9181 History of falling: Secondary | ICD-10-CM | POA: Diagnosis not present

## 2020-11-28 DIAGNOSIS — H25812 Combined forms of age-related cataract, left eye: Secondary | ICD-10-CM | POA: Diagnosis not present

## 2020-11-28 DIAGNOSIS — E785 Hyperlipidemia, unspecified: Secondary | ICD-10-CM | POA: Diagnosis not present

## 2020-11-28 DIAGNOSIS — Z7982 Long term (current) use of aspirin: Secondary | ICD-10-CM | POA: Diagnosis not present

## 2020-11-28 DIAGNOSIS — I10 Essential (primary) hypertension: Secondary | ICD-10-CM | POA: Diagnosis not present

## 2020-11-28 DIAGNOSIS — Z96642 Presence of left artificial hip joint: Secondary | ICD-10-CM | POA: Diagnosis not present

## 2020-11-28 DIAGNOSIS — H524 Presbyopia: Secondary | ICD-10-CM | POA: Diagnosis not present

## 2020-11-28 DIAGNOSIS — M810 Age-related osteoporosis without current pathological fracture: Secondary | ICD-10-CM | POA: Diagnosis not present

## 2020-11-28 DIAGNOSIS — H43813 Vitreous degeneration, bilateral: Secondary | ICD-10-CM | POA: Diagnosis not present

## 2020-11-28 DIAGNOSIS — F32A Depression, unspecified: Secondary | ICD-10-CM | POA: Diagnosis not present

## 2020-11-28 DIAGNOSIS — H35372 Puckering of macula, left eye: Secondary | ICD-10-CM | POA: Diagnosis not present

## 2020-11-28 DIAGNOSIS — I69354 Hemiplegia and hemiparesis following cerebral infarction affecting left non-dominant side: Secondary | ICD-10-CM | POA: Diagnosis not present

## 2020-11-28 DIAGNOSIS — G4700 Insomnia, unspecified: Secondary | ICD-10-CM | POA: Diagnosis not present

## 2020-11-28 DIAGNOSIS — G20 Parkinson's disease: Secondary | ICD-10-CM | POA: Diagnosis not present

## 2020-12-02 DIAGNOSIS — I69354 Hemiplegia and hemiparesis following cerebral infarction affecting left non-dominant side: Secondary | ICD-10-CM | POA: Diagnosis not present

## 2020-12-02 DIAGNOSIS — E785 Hyperlipidemia, unspecified: Secondary | ICD-10-CM | POA: Diagnosis not present

## 2020-12-02 DIAGNOSIS — Z7982 Long term (current) use of aspirin: Secondary | ICD-10-CM | POA: Diagnosis not present

## 2020-12-02 DIAGNOSIS — G629 Polyneuropathy, unspecified: Secondary | ICD-10-CM | POA: Diagnosis not present

## 2020-12-02 DIAGNOSIS — G4700 Insomnia, unspecified: Secondary | ICD-10-CM | POA: Diagnosis not present

## 2020-12-02 DIAGNOSIS — M810 Age-related osteoporosis without current pathological fracture: Secondary | ICD-10-CM | POA: Diagnosis not present

## 2020-12-02 DIAGNOSIS — Z96642 Presence of left artificial hip joint: Secondary | ICD-10-CM | POA: Diagnosis not present

## 2020-12-02 DIAGNOSIS — F32A Depression, unspecified: Secondary | ICD-10-CM | POA: Diagnosis not present

## 2020-12-02 DIAGNOSIS — I10 Essential (primary) hypertension: Secondary | ICD-10-CM | POA: Diagnosis not present

## 2020-12-02 DIAGNOSIS — Z9181 History of falling: Secondary | ICD-10-CM | POA: Diagnosis not present

## 2020-12-02 DIAGNOSIS — G20 Parkinson's disease: Secondary | ICD-10-CM | POA: Diagnosis not present

## 2020-12-03 DIAGNOSIS — E785 Hyperlipidemia, unspecified: Secondary | ICD-10-CM | POA: Diagnosis not present

## 2020-12-03 DIAGNOSIS — I10 Essential (primary) hypertension: Secondary | ICD-10-CM | POA: Diagnosis not present

## 2020-12-03 DIAGNOSIS — G4700 Insomnia, unspecified: Secondary | ICD-10-CM | POA: Diagnosis not present

## 2020-12-03 DIAGNOSIS — G20 Parkinson's disease: Secondary | ICD-10-CM | POA: Diagnosis not present

## 2020-12-03 DIAGNOSIS — Z7982 Long term (current) use of aspirin: Secondary | ICD-10-CM | POA: Diagnosis not present

## 2020-12-03 DIAGNOSIS — M810 Age-related osteoporosis without current pathological fracture: Secondary | ICD-10-CM | POA: Diagnosis not present

## 2020-12-03 DIAGNOSIS — F32A Depression, unspecified: Secondary | ICD-10-CM | POA: Diagnosis not present

## 2020-12-03 DIAGNOSIS — G629 Polyneuropathy, unspecified: Secondary | ICD-10-CM | POA: Diagnosis not present

## 2020-12-03 DIAGNOSIS — I69354 Hemiplegia and hemiparesis following cerebral infarction affecting left non-dominant side: Secondary | ICD-10-CM | POA: Diagnosis not present

## 2020-12-03 DIAGNOSIS — Z96642 Presence of left artificial hip joint: Secondary | ICD-10-CM | POA: Diagnosis not present

## 2020-12-03 DIAGNOSIS — Z9181 History of falling: Secondary | ICD-10-CM | POA: Diagnosis not present

## 2020-12-09 DIAGNOSIS — G4700 Insomnia, unspecified: Secondary | ICD-10-CM | POA: Diagnosis not present

## 2020-12-09 DIAGNOSIS — E7800 Pure hypercholesterolemia, unspecified: Secondary | ICD-10-CM | POA: Diagnosis not present

## 2020-12-09 DIAGNOSIS — Z9181 History of falling: Secondary | ICD-10-CM | POA: Diagnosis not present

## 2020-12-09 DIAGNOSIS — F32A Depression, unspecified: Secondary | ICD-10-CM | POA: Diagnosis not present

## 2020-12-09 DIAGNOSIS — I10 Essential (primary) hypertension: Secondary | ICD-10-CM | POA: Diagnosis not present

## 2020-12-09 DIAGNOSIS — Z7982 Long term (current) use of aspirin: Secondary | ICD-10-CM | POA: Diagnosis not present

## 2020-12-09 DIAGNOSIS — E785 Hyperlipidemia, unspecified: Secondary | ICD-10-CM | POA: Diagnosis not present

## 2020-12-09 DIAGNOSIS — G629 Polyneuropathy, unspecified: Secondary | ICD-10-CM | POA: Diagnosis not present

## 2020-12-09 DIAGNOSIS — M810 Age-related osteoporosis without current pathological fracture: Secondary | ICD-10-CM | POA: Diagnosis not present

## 2020-12-09 DIAGNOSIS — G20 Parkinson's disease: Secondary | ICD-10-CM | POA: Diagnosis not present

## 2020-12-09 DIAGNOSIS — Z96642 Presence of left artificial hip joint: Secondary | ICD-10-CM | POA: Diagnosis not present

## 2020-12-09 DIAGNOSIS — I69354 Hemiplegia and hemiparesis following cerebral infarction affecting left non-dominant side: Secondary | ICD-10-CM | POA: Diagnosis not present

## 2020-12-10 DIAGNOSIS — F32A Depression, unspecified: Secondary | ICD-10-CM | POA: Diagnosis not present

## 2020-12-10 DIAGNOSIS — G629 Polyneuropathy, unspecified: Secondary | ICD-10-CM | POA: Diagnosis not present

## 2020-12-10 DIAGNOSIS — I69354 Hemiplegia and hemiparesis following cerebral infarction affecting left non-dominant side: Secondary | ICD-10-CM | POA: Diagnosis not present

## 2020-12-10 DIAGNOSIS — G20 Parkinson's disease: Secondary | ICD-10-CM | POA: Diagnosis not present

## 2020-12-10 DIAGNOSIS — E785 Hyperlipidemia, unspecified: Secondary | ICD-10-CM | POA: Diagnosis not present

## 2020-12-10 DIAGNOSIS — Z96642 Presence of left artificial hip joint: Secondary | ICD-10-CM | POA: Diagnosis not present

## 2020-12-10 DIAGNOSIS — M810 Age-related osteoporosis without current pathological fracture: Secondary | ICD-10-CM | POA: Diagnosis not present

## 2020-12-10 DIAGNOSIS — Z9181 History of falling: Secondary | ICD-10-CM | POA: Diagnosis not present

## 2020-12-10 DIAGNOSIS — G4700 Insomnia, unspecified: Secondary | ICD-10-CM | POA: Diagnosis not present

## 2020-12-10 DIAGNOSIS — I10 Essential (primary) hypertension: Secondary | ICD-10-CM | POA: Diagnosis not present

## 2020-12-10 DIAGNOSIS — Z7982 Long term (current) use of aspirin: Secondary | ICD-10-CM | POA: Diagnosis not present

## 2020-12-16 DIAGNOSIS — Z9181 History of falling: Secondary | ICD-10-CM | POA: Diagnosis not present

## 2020-12-16 DIAGNOSIS — G4700 Insomnia, unspecified: Secondary | ICD-10-CM | POA: Diagnosis not present

## 2020-12-16 DIAGNOSIS — Z96642 Presence of left artificial hip joint: Secondary | ICD-10-CM | POA: Diagnosis not present

## 2020-12-16 DIAGNOSIS — Z7982 Long term (current) use of aspirin: Secondary | ICD-10-CM | POA: Diagnosis not present

## 2020-12-16 DIAGNOSIS — M810 Age-related osteoporosis without current pathological fracture: Secondary | ICD-10-CM | POA: Diagnosis not present

## 2020-12-16 DIAGNOSIS — I10 Essential (primary) hypertension: Secondary | ICD-10-CM | POA: Diagnosis not present

## 2020-12-16 DIAGNOSIS — F32A Depression, unspecified: Secondary | ICD-10-CM | POA: Diagnosis not present

## 2020-12-16 DIAGNOSIS — G629 Polyneuropathy, unspecified: Secondary | ICD-10-CM | POA: Diagnosis not present

## 2020-12-16 DIAGNOSIS — I69354 Hemiplegia and hemiparesis following cerebral infarction affecting left non-dominant side: Secondary | ICD-10-CM | POA: Diagnosis not present

## 2020-12-16 DIAGNOSIS — E785 Hyperlipidemia, unspecified: Secondary | ICD-10-CM | POA: Diagnosis not present

## 2020-12-16 DIAGNOSIS — G20 Parkinson's disease: Secondary | ICD-10-CM | POA: Diagnosis not present

## 2020-12-19 DIAGNOSIS — R899 Unspecified abnormal finding in specimens from other organs, systems and tissues: Secondary | ICD-10-CM | POA: Diagnosis not present

## 2020-12-19 DIAGNOSIS — I679 Cerebrovascular disease, unspecified: Secondary | ICD-10-CM | POA: Diagnosis not present

## 2020-12-19 DIAGNOSIS — E7800 Pure hypercholesterolemia, unspecified: Secondary | ICD-10-CM | POA: Diagnosis not present

## 2020-12-19 DIAGNOSIS — R7303 Prediabetes: Secondary | ICD-10-CM | POA: Diagnosis not present

## 2020-12-19 DIAGNOSIS — M810 Age-related osteoporosis without current pathological fracture: Secondary | ICD-10-CM | POA: Diagnosis not present

## 2020-12-19 DIAGNOSIS — D75839 Thrombocytosis, unspecified: Secondary | ICD-10-CM | POA: Diagnosis not present

## 2020-12-19 DIAGNOSIS — I69354 Hemiplegia and hemiparesis following cerebral infarction affecting left non-dominant side: Secondary | ICD-10-CM | POA: Diagnosis not present

## 2020-12-19 DIAGNOSIS — R296 Repeated falls: Secondary | ICD-10-CM | POA: Diagnosis not present

## 2020-12-19 DIAGNOSIS — I10 Essential (primary) hypertension: Secondary | ICD-10-CM | POA: Diagnosis not present

## 2020-12-19 DIAGNOSIS — G20 Parkinson's disease: Secondary | ICD-10-CM | POA: Diagnosis not present

## 2020-12-19 DIAGNOSIS — E785 Hyperlipidemia, unspecified: Secondary | ICD-10-CM | POA: Diagnosis not present

## 2020-12-19 DIAGNOSIS — E559 Vitamin D deficiency, unspecified: Secondary | ICD-10-CM | POA: Diagnosis not present

## 2020-12-31 DIAGNOSIS — Z0000 Encounter for general adult medical examination without abnormal findings: Secondary | ICD-10-CM | POA: Diagnosis not present

## 2020-12-31 DIAGNOSIS — Z1389 Encounter for screening for other disorder: Secondary | ICD-10-CM | POA: Diagnosis not present

## 2021-04-15 ENCOUNTER — Other Ambulatory Visit: Payer: Self-pay | Admitting: Physician Assistant

## 2021-04-15 DIAGNOSIS — G4700 Insomnia, unspecified: Secondary | ICD-10-CM

## 2021-04-30 ENCOUNTER — Other Ambulatory Visit: Payer: Self-pay | Admitting: Physician Assistant

## 2021-04-30 DIAGNOSIS — F320 Major depressive disorder, single episode, mild: Secondary | ICD-10-CM

## 2021-05-06 ENCOUNTER — Other Ambulatory Visit: Payer: Self-pay | Admitting: Physician Assistant

## 2021-05-06 DIAGNOSIS — F320 Major depressive disorder, single episode, mild: Secondary | ICD-10-CM

## 2021-05-11 ENCOUNTER — Emergency Department (HOSPITAL_BASED_OUTPATIENT_CLINIC_OR_DEPARTMENT_OTHER)

## 2021-05-11 ENCOUNTER — Other Ambulatory Visit: Payer: Self-pay

## 2021-05-11 ENCOUNTER — Encounter (HOSPITAL_BASED_OUTPATIENT_CLINIC_OR_DEPARTMENT_OTHER): Payer: Self-pay | Admitting: Obstetrics and Gynecology

## 2021-05-11 ENCOUNTER — Emergency Department (HOSPITAL_BASED_OUTPATIENT_CLINIC_OR_DEPARTMENT_OTHER)
Admission: EM | Admit: 2021-05-11 | Discharge: 2021-05-11 | Disposition: A | Discharge status: Home / Self Care | Attending: Emergency Medicine | Admitting: Emergency Medicine

## 2021-05-11 DIAGNOSIS — G20 Parkinson's disease: Secondary | ICD-10-CM | POA: Insufficient documentation

## 2021-05-11 DIAGNOSIS — W07XXXA Fall from chair, initial encounter: Secondary | ICD-10-CM | POA: Diagnosis not present

## 2021-05-11 DIAGNOSIS — S0003XA Contusion of scalp, initial encounter: Secondary | ICD-10-CM | POA: Diagnosis not present

## 2021-05-11 DIAGNOSIS — I10 Essential (primary) hypertension: Secondary | ICD-10-CM | POA: Insufficient documentation

## 2021-05-11 DIAGNOSIS — Z79899 Other long term (current) drug therapy: Secondary | ICD-10-CM | POA: Diagnosis not present

## 2021-05-11 DIAGNOSIS — W19XXXA Unspecified fall, initial encounter: Secondary | ICD-10-CM

## 2021-05-11 DIAGNOSIS — Z96642 Presence of left artificial hip joint: Secondary | ICD-10-CM | POA: Insufficient documentation

## 2021-05-11 DIAGNOSIS — S0990XA Unspecified injury of head, initial encounter: Secondary | ICD-10-CM | POA: Diagnosis present

## 2021-05-11 DIAGNOSIS — Z7982 Long term (current) use of aspirin: Secondary | ICD-10-CM | POA: Insufficient documentation

## 2021-05-11 NOTE — Progress Notes (Addendum)
[facility])      This patient is an active hospice pt with [facility] admitted on 01/26/21 with a terminal diagnosis of Parkinson's Disease. Per [facility] SW noted from 05/06/21 pt is continuously confused with nonsensical conversation at baseline.   [facility] will continue to follow for any discharge planning needs and to coordinate continuation of palliative care.   At time of discharge please use [facility] as we contract this service with them for our active hospice pts. Marylene Land, RN, made aware.  Thank you for the opportunity to participate in this patient's care.     Gillian Scarce, BSN, RN [facility] Liaison [phone] (24h on call)

## 2021-05-11 NOTE — ED Provider Notes (Signed)
[facility] EMERGENCY DEPT Provider Note   CSN: [id number] Arrival date & time: 05/11/21  1236     History Chief Complaint  Patient presents with   Marletta Lor    Dana [name] is a 75 y.o. female.  Pt presents to the ED today with a fall and head injury.  Pt has a hx of Parkinson's disease and does not transfer very well.  She was trying to scoot from her bed to the chair and she slid down and bumped her head.  She did not want to come to the ED, but her facility wanted her evaluated.  She is not on blood thinners.  She did not have a loc.       Past Medical History:  Diagnosis Date   Anemia    none since hysterectomy   Arthritis    LlTHA 11'15(had a fall)-"remains tender"   Depression    Heart murmur    history of   Hyperlipidemia    Hypertension    Parkinson's disease (HCC)    Stroke (HCC)    4-5 yrs ago(left sided weakness) -no residual   Transfusion history    age 75- "hysterectomy"    Patient Active Problem List   Diagnosis Date Noted   Gastroesophageal reflux disease without esophagitis 07/04/2020   Senile osteoporosis 07/04/2020   Recurrent falls 07/04/2020   Parkinsonism (HCC) 07/04/2020   Unsteady gait 07/04/2020   Pressure injury of skin 06/14/2020   Acute metabolic encephalopathy 05/20/2020   Confusion 05/19/2020   Generalized weakness 05/19/2020   Peri-prosthetic fracture around prosthetic hip    Fall 03/27/2020   Failure to thrive in adult 11/23/2019   Fall at home 11/23/2019   Parkinsonian features 11/23/2019   Hoarding behavior 11/23/2019   Polyneuropathy 10/04/2019   Insomnia 09/07/2018   Major depressive disorder 07/25/2018   GAD (generalized anxiety disorder) 07/25/2018   Hip fracture, left (HCC) 09/15/2014   Chronic back pain 09/15/2014   History of stroke    Hyperlipidemia    Essential hypertension    Spastic hemiplegia affecting nondominant side (HCC) 01/12/2012    Past Surgical History:  Procedure Laterality Date    ABDOMINAL HYSTERECTOMY     COLONOSCOPY WITH PROPOFOL N/A 12/25/2014   Procedure: COLONOSCOPY WITH PROPOFOL;  Surgeon: Charolett Bumpers, MD;  Location: [facility];  Service: Endoscopy;  Laterality: N/A;   FRACTURE SURGERY  01/22/12   fx of eye socket from fall-"retained plate"   HAND SURGERY Left    nerve release -many yrs ago   HIP ARTHROPLASTY Left 09/16/2014   Procedure: ARTHROPLASTY BIPOLAR HIP;  Surgeon: Sheral Apley, MD;  [facility]: [facility];  Service: Orthopedics;  Laterality: Left;     OB History     Gravida      Para      Term      Preterm      AB      Living  1      SAB      IAB      Ectopic      Multiple      Live Births              Family History  Problem Relation Age of Onset   Hyperlipidemia Mother    Hypertension Mother    Diabetes Mother    Heart disease Mother        aortic vavle  replaced   Hypertension Father    Hyperlipidemia Father    Heart disease Father  aortic valve replaced   Alzheimer's disease Father     Social History   Tobacco Use   Smoking status: Never   Smokeless tobacco: Never  Vaping Use   Vaping Use: Never used  Substance Use Topics   Alcohol use: Not Currently    Comment: occasional beer, wishes she could drink her vodka   Drug use: Never    Home Medications Prior to Admission medications   Medication Sig Start Date End Date Taking? Authorizing Provider  alendronate (FOSAMAX) 70 MG tablet Take 1 tablet (70 mg total) by mouth once a week. Take with 8 oz of water 30 minutes prior to food/meds. Do not lie down for 30 minutes. 07/04/20  Yes Ngetich, Dinah C, NP  ARIPiprazole (ABILIFY) 10 MG tablet Take 1 tablet (10 mg total) by mouth daily. 07/04/20  Yes Ngetich, Dinah C, NP  aspirin 81 MG chewable tablet Chew 81 mg by mouth daily.   Yes [provider]  atorvastatin (LIPITOR) 40 MG tablet Take 1 tablet (40 mg total) by mouth daily. 07/04/20  Yes Ngetich, Dinah C, NP  buPROPion (WELLBUTRIN XL) 150  MG 24 hr tablet Take 3 tablets (450 mg total) by mouth daily. 07/04/20  Yes Ngetich, Dinah C, NP  carbidopa-levodopa (SINEMET) 25-100 MG tablet Take 1 tablet by mouth 3 (three) times daily. 07/04/20  Yes Ngetich, Dinah C, NP  Cholecalciferol (VITAMIN D) 50 MCG (2000 UT) CAPS Take 2,000 Units by mouth daily.    Yes [provider]  gabapentin (NEURONTIN) 300 MG capsule Take 1 capsule (300 mg total) by mouth 3 (three) times daily. 07/04/20  Yes Ngetich, Dinah C, NP  melatonin 5 MG TABS Take 5 mg by mouth at bedtime.    Yes [provider]  metoprolol tartrate (LOPRESSOR) 25 MG tablet Take 1 tablet (25 mg total) by mouth 2 (two) times daily. 07/04/20  Yes Ngetich, Dinah C, NP  Multiple Vitamin (MULTIVITAMIN) tablet Take 1 tablet by mouth daily.   Yes [provider]  pantoprazole (PROTONIX) 40 MG tablet Take 1 tablet (40 mg total) by mouth daily. 07/04/20  Yes Ngetich, Dinah C, NP  sertraline (ZOLOFT) 100 MG tablet Take 2 tablets (200 mg total) by mouth daily. 07/04/20  Yes Ngetich, Dinah C, NP  traZODone (DESYREL) 50 MG tablet Take 1 tablet (50 mg total) by mouth at bedtime. 07/04/20  Yes Ngetich, Dinah C, NP  acetaminophen (TYLENOL) 500 MG tablet Take 2 tablets (1,000 mg total) by mouth every 8 (eight) hours as needed. 04/02/20   Zigmund Daniel., MD  tiZANidine (ZANAFLEX) 2 MG tablet Take 1 tablet (2 mg total) by mouth 2 (two) times daily. QAM and QHS 07/04/20   Ngetich, Dinah C, NP    Allergies    Percodan [oxycodone-aspirin] and Oxycodone  Review of Systems   Review of Systems  All other systems reviewed and are negative.  Physical Exam Updated Vital Signs BP 126/75   Pulse 63   Temp 98.7 F (37.1 C) (Oral)   Resp 15   SpO2 100%   Physical Exam Vitals and nursing note reviewed.  Constitutional:      Appearance: Normal appearance.  HENT:     Head: Normocephalic and atraumatic.     Right Ear: External ear normal.     Left Ear: External ear normal.      Nose: Nose normal.     Mouth/Throat:     Mouth: Mucous membranes are moist.     Pharynx: Oropharynx is  clear.  Eyes:     Extraocular Movements: Extraocular movements intact.     Conjunctiva/sclera: Conjunctivae normal.     Pupils: Pupils are equal, round, and reactive to light.  Cardiovascular:     Rate and Rhythm: Normal rate and regular rhythm.     Pulses: Normal pulses.     Heart sounds: Normal heart sounds.  Pulmonary:     Effort: Pulmonary effort is normal.     Breath sounds: Normal breath sounds.  Abdominal:     General: Abdomen is flat. Bowel sounds are normal.     Palpations: Abdomen is soft.  Musculoskeletal:        General: Normal range of motion.     Cervical back: Normal range of motion and neck supple.  Skin:    General: Skin is warm.     Capillary Refill: Capillary refill takes less than 2 seconds.  Neurological:     General: No focal deficit present.     Mental Status: She is alert and oriented to person, place, and time.     Motor: Tremor present.  Psychiatric:        Mood and Affect: Mood normal.        Behavior: Behavior normal.    ED Results / Procedures / Treatments   Labs (all labs ordered are listed, but only abnormal results are displayed) Labs Reviewed - No data to display  EKG None  Radiology CT Head Wo Contrast  Result Date: 05/11/2021 CLINICAL DATA:  75 year old female with headache following fall and head injury. EXAM: CT HEAD WITHOUT CONTRAST TECHNIQUE: Contiguous axial images were obtained from the base of the skull through the vertex without intravenous contrast. COMPARISON:  09/21/2020 CT and prior studies FINDINGS: Brain: No evidence of acute infarction, hemorrhage, hydrocephalus, extra-axial collection or mass lesion/mass effect. Atrophy and chronic small-vessel white matter ischemic changes again noted. Vascular: Carotid and vertebral atherosclerotic calcifications are noted. Skull: Normal. Negative for fracture or focal lesion.  Sinuses/Orbits: No acute finding. Other: None. IMPRESSION: 1. No evidence of acute intracranial abnormality. 2. Atrophy and chronic small-vessel white matter ischemic changes. Electronically Signed   By: Harmon Pier M.D.   On: 05/11/2021 13:20    Procedures Procedures   Medications Ordered in ED Medications - No data to display  ED Course  I have reviewed the triage vital signs and the nursing notes.  Pertinent labs & imaging results that were available during my care of the patient were reviewed by me and considered in my medical decision making (see chart for details).    MDM Rules/Calculators/A&P                          CT head neg.  No other injury.  Pt is stable for d/c.  Return if worse. Final Clinical Impression(s) / ED Diagnoses Final diagnoses:  Fall, initial encounter  Contusion of scalp, initial encounter    Rx / DC Orders ED Discharge Orders     None        Jacalyn Lefevre, MD 05/11/21 1326

## 2021-05-11 NOTE — ED Triage Notes (Signed)
Patient reports to the ER for mechanical fall from standing. Patient has a hx of parkinsons and was trying to scoot from the bed to the chair and she slid down and bumped her head. Patient is in assisted living. She is alert and oriented. Denies any complaints, denies blood thinners, reports she just wants to continue reading her book.

## 2021-06-21 DIAGNOSIS — R531 Weakness: Secondary | ICD-10-CM | POA: Diagnosis not present

## 2021-06-21 DIAGNOSIS — Z96642 Presence of left artificial hip joint: Secondary | ICD-10-CM | POA: Insufficient documentation

## 2021-06-21 DIAGNOSIS — Z79899 Other long term (current) drug therapy: Secondary | ICD-10-CM | POA: Diagnosis not present

## 2021-06-21 DIAGNOSIS — R251 Tremor, unspecified: Secondary | ICD-10-CM | POA: Insufficient documentation

## 2021-06-21 DIAGNOSIS — Z7982 Long term (current) use of aspirin: Secondary | ICD-10-CM | POA: Diagnosis not present

## 2021-06-21 DIAGNOSIS — N390 Urinary tract infection, site not specified: Secondary | ICD-10-CM | POA: Insufficient documentation

## 2021-06-21 DIAGNOSIS — W01198A Fall on same level from slipping, tripping and stumbling with subsequent striking against other object, initial encounter: Secondary | ICD-10-CM | POA: Diagnosis not present

## 2021-06-21 DIAGNOSIS — M542 Cervicalgia: Secondary | ICD-10-CM | POA: Diagnosis present

## 2021-06-21 DIAGNOSIS — I10 Essential (primary) hypertension: Secondary | ICD-10-CM | POA: Insufficient documentation

## 2021-06-21 NOTE — ED Triage Notes (Signed)
Pt here from [facility] , staff was in room with pt but did see her fall , pt is c/o head and right shoulder pain

## 2021-06-22 ENCOUNTER — Emergency Department (HOSPITAL_COMMUNITY)
Admission: EM | Admit: 2021-06-22 | Discharge: 2021-06-22 | Disposition: A | Discharge status: Home / Self Care | Attending: Emergency Medicine | Admitting: Emergency Medicine

## 2021-06-22 ENCOUNTER — Encounter (HOSPITAL_COMMUNITY): Payer: Self-pay | Admitting: Emergency Medicine

## 2021-06-22 ENCOUNTER — Emergency Department (HOSPITAL_COMMUNITY)

## 2021-06-22 DIAGNOSIS — W1830XA Fall on same level, unspecified, initial encounter: Secondary | ICD-10-CM

## 2021-06-22 DIAGNOSIS — N390 Urinary tract infection, site not specified: Secondary | ICD-10-CM

## 2021-06-22 LAB — URINALYSIS, ROUTINE W REFLEX MICROSCOPIC
Bilirubin Urine: NEGATIVE
Glucose, UA: NEGATIVE mg/dL
Hgb urine dipstick: NEGATIVE
Ketones, ur: NEGATIVE mg/dL
Nitrite: POSITIVE — AB
Protein, ur: NEGATIVE mg/dL
Specific Gravity, Urine: 1.009 (ref 1.005–1.030)
pH: 7 (ref 5.0–8.0)

## 2021-06-22 MED ORDER — CEPHALEXIN 500 MG PO CAPS: 500.0000 mg | ORAL_CAPSULE | Freq: Four times a day (QID) | ORAL | 0 refills | Status: DC

## 2021-06-22 MED ORDER — CEPHALEXIN 250 MG PO CAPS
500.0000 mg | ORAL_CAPSULE | Freq: Once | ORAL | Status: AC
  Administered 2021-06-22: 500 mg via ORAL
  Filled 2021-06-22: qty 2

## 2021-06-22 NOTE — ED Provider Notes (Signed)
Emergency Medicine Provider Triage Evaluation Note  Dana [name] , a 75 y.o. female  was evaluated in triage.  Pt complains of fall.  Patient from [facility] skilled [facility] facility, staff was in the room with her but as they turned around she had a fall falling forward and striking the front of her head as well as her right shoulder.  C-collar applied by EMS.  No loss of consciousness.  Patient not on blood thinners.  Alert and oriented at baseline per facility.  No pain elsewhere  Review of Systems  Positive: Head injury, shoulder pain Negative: Neck pain, back pain, chest pain, abdominal pain  Physical Exam  BP (!) 141/60 (BP Location: Left Arm)   Pulse 66   Temp 98.7 F (37.1 C) (Oral)   Resp 17   SpO2 98%  Gen:   Awake, no distress   Resp:  Normal effort  MSK:   Moves extremities without difficulty  Other:  Patient alert and at baseline, no facial droop, palpable hematoma to the anterior forehead, moving all extremities without obvious neurologic deficit  Medical Decision Making  Medically screening exam initiated at 12:11 AM.  Appropriate orders placed.  Amberly Livas was informed that the remainder of the evaluation will be completed by another provider, this initial triage assessment does not replace that evaluation, and the importance of remaining in the ED until their evaluation is complete.     Dartha Lodge, PA-C 06/22/21 0016    Zadie Rhine, MD 06/22/21 0100

## 2021-06-22 NOTE — ED Notes (Signed)
[facility] called for additional info about pt. Updated staff member.

## 2021-06-22 NOTE — ED Notes (Signed)
[facility] was called for transport 

## 2021-06-22 NOTE — ED Notes (Signed)
[place] [facility] taking over care awaiting [facility].

## 2021-06-22 NOTE — ED Provider Notes (Signed)
Signout from Dr. Dalene Seltzer.  75 year old female here for evaluation of injuries from a fall.  X-ray and CT imaging has been negative.  Plan is to follow-up on urinalysis and cover with antibiotics if positive. Physical Exam  BP (!) 140/54   Pulse 64   Temp 98.7 F (37.1 C) (Oral)   Resp 20   SpO2 96%   Physical Exam  ED Course/Procedures     Procedures  MDM  Urinalysis is consistent with infection.  Will review with patient and provide with prescription for antibiotics.       Terrilee Files, MD 06/23/21 1028

## 2021-06-22 NOTE — Discharge Instructions (Addendum)
You were evaluated at [facility] ED for ground level fall at your living facility. Your shoulder and knee imaging showed no fractures. No abnormalities on head or neck imaging. You have a bruise on your right forehead which can be managed with hot or cold pack and over the counter pain medications.  Your urine showed signs of a possible urine infection and we are placing you on antibiotics.  Please finish these and follow-up with your primary care doctor.   Thank you for allowing [place] to be part of your care.

## 2021-06-22 NOTE — ED Notes (Signed)
[facility] called to ask that medication be sent to the 24 hour [facility] off [address] so that someone from the staff could go pick it up. Sent information to doctor.

## 2021-06-22 NOTE — ED Notes (Signed)
Pt arrived to room

## 2021-06-22 NOTE — ED Provider Notes (Signed)
[facility]ENC[facility] Provider Note   CSN: [id number] Arrival date & time: 06/21/21  2328     History No chief complaint on file.   Dana [name] is a 75 y.o. female with PMHx of CVA R pontine infarct (2012), HTN, HLD, Parkinson's disease, osteoporosis, history of falls (2 previous falls last 6 months), failure to thrive, who [facility] to [facility] for ground level fall at senior living facility. Patient was seated on toilet at living facility when she fell forward and hit her head on the floor. She reports no LOC, no prodromal syncope symptoms, was not feeling faint or lightheaded, not dizzy, not nauseous, did not trip over anything. She was able to press emergency button necklace to alert staff of her fall. Was told she had low blood pressure prior to leaving with EMS from facility. She endorses headache on the front of her head that has stayed the same and left knee pain. She denies fever, chest pain, SOB, flu like symptoms, abdominal pain, pain to other areas of the body. She endorses dysuria without urgency, wears diapers at baseline.   HPI     Past Medical History:  Diagnosis Date   Anemia    none since hysterectomy   Arthritis    LlTHA 11'15(had a fall)-"remains tender"   Depression    Heart murmur    history of   Hyperlipidemia    Hypertension    Parkinson's disease (HCC)    Stroke (HCC)    2012 R pontine infarct (left sided weakness) -no residual   Transfusion history    age 75- "hysterectomy"    Patient Active Problem List   Diagnosis Date Noted   Gastroesophageal reflux disease without esophagitis 07/04/2020   Senile osteoporosis 07/04/2020   Recurrent falls 07/04/2020   Parkinsonism (HCC) 07/04/2020   Unsteady gait 07/04/2020   Pressure injury of skin 06/14/2020   Acute metabolic encephalopathy 05/20/2020   Confusion 05/19/2020   Generalized weakness 05/19/2020   Peri-prosthetic fracture around prosthetic hip    Fall  03/27/2020   Failure to thrive in adult 11/23/2019   Fall at home 11/23/2019   Parkinsonian features 11/23/2019   Hoarding behavior 11/23/2019   Polyneuropathy 10/04/2019   Insomnia 09/07/2018   Major depressive disorder 07/25/2018   GAD (generalized anxiety disorder) 07/25/2018   Hip fracture, left (HCC) 09/15/2014   Chronic back pain 09/15/2014   History of stroke    Hyperlipidemia    Essential hypertension    Spastic hemiplegia affecting nondominant side (HCC) 01/12/2012    Past Surgical History:  Procedure Laterality Date   ABDOMINAL HYSTERECTOMY     COLONOSCOPY WITH PROPOFOL N/A 12/25/2014   Procedure: COLONOSCOPY WITH PROPOFOL;  Surgeon: Charolett BumpersMartin K Johnson, MD;  Location: [facility] ENDOSCOPY;  Service: Endoscopy;  Laterality: N/A;   FRACTURE SURGERY  01/22/12   fx of eye socket from fall-"retained plate"   HAND SURGERY Left    nerve release -many yrs ago   HIP ARTHROPLASTY Left 09/16/2014   Procedure: ARTHROPLASTY BIPOLAR HIP;  Surgeon: Sheral Apleyimothy D Murphy, MD;  [facility];  Service: Orthopedics;  Laterality: Left;     OB History     Gravida      Para      Term      Preterm      AB      Living  1      SAB      IAB      Ectopic  Multiple      Live Births              Family History  Problem Relation Age of Onset   Hyperlipidemia Mother    Hypertension Mother    Diabetes Mother    Heart disease Mother        aortic vavle  replaced   Hypertension Father    Hyperlipidemia Father    Heart disease Father        aortic valve replaced   Alzheimer's disease Father     Social History   Tobacco Use   Smoking status: Never   Smokeless tobacco: Never  Vaping Use   Vaping Use: Never used  Substance Use Topics   Alcohol use: Not Currently    Comment: occasional beer, wishes she could drink her vodka   Drug use: Never    Home Medications Prior to Admission medications   Medication Sig Start Date End Date Taking? Authorizing Provider   acetaminophen (TYLENOL) 500 MG tablet Take 2 tablets (1,000 mg total) by mouth every 8 (eight) hours as needed. 04/02/20  Yes Zigmund Daniel., MD  alendronate (FOSAMAX) 70 MG tablet Take 1 tablet (70 mg total) by mouth once a week. Take with 8 oz of water 30 minutes prior to food/meds. Do not lie down for 30 minutes. 07/04/20  Yes Ngetich, Dinah C, NP  ARIPiprazole (ABILIFY) 10 MG tablet Take 1 tablet (10 mg total) by mouth daily. 07/04/20  Yes Ngetich, Dinah C, NP  aspirin 81 MG chewable tablet Chew 81 mg by mouth daily.   Yes [provider]  atorvastatin (LIPITOR) 40 MG tablet Take 1 tablet (40 mg total) by mouth daily. 07/04/20  Yes Ngetich, Dinah C, NP  buPROPion (WELLBUTRIN XL) 150 MG 24 hr tablet Take 3 tablets (450 mg total) by mouth daily. 07/04/20  Yes Ngetich, Dinah C, NP  carbidopa-levodopa (SINEMET) 25-100 MG tablet Take 1 tablet by mouth 3 (three) times daily. 07/04/20  Yes Ngetich, Dinah C, NP  Cholecalciferol (VITAMIN D) 50 MCG (2000 UT) CAPS Take 2,000 Units by mouth daily.    Yes [provider]  gabapentin (NEURONTIN) 300 MG capsule Take 1 capsule (300 mg total) by mouth 3 (three) times daily. 07/04/20  Yes Ngetich, Dinah C, NP  melatonin 5 MG TABS Take 5 mg by mouth at bedtime.    Yes [provider]  metoprolol tartrate (LOPRESSOR) 25 MG tablet Take 1 tablet (25 mg total) by mouth 2 (two) times daily. 07/04/20  Yes Ngetich, Dinah C, NP  Multiple Vitamin (MULTIVITAMIN) tablet Take 1 tablet by mouth daily.   Yes [provider]  pantoprazole (PROTONIX) 40 MG tablet Take 1 tablet (40 mg total) by mouth daily. 07/04/20  Yes Ngetich, Dinah C, NP  sertraline (ZOLOFT) 100 MG tablet Take 2 tablets (200 mg total) by mouth daily. 07/04/20  Yes Ngetich, Dinah C, NP  tiZANidine (ZANAFLEX) 2 MG tablet Take 1 tablet (2 mg total) by mouth 2 (two) times daily. QAM and QHS 07/04/20  Yes Ngetich, Dinah C, NP  traZODone (DESYREL) 50 MG tablet Take 1 tablet (50 mg  total) by mouth at bedtime. 07/04/20  Yes Ngetich, Dinah C, NP    Allergies    Percodan [oxycodone-aspirin] and Oxycodone  Review of Systems   Review of Systems  Constitutional:  Negative for fatigue and fever.  HENT:  Negative for congestion, rhinorrhea and sore throat.   Eyes:  Negative for visual disturbance.  Respiratory:  Negative for cough and shortness of breath.   Cardiovascular:  Negative for chest pain.  Gastrointestinal:  Negative for abdominal pain, constipation, diarrhea, nausea and vomiting.  Genitourinary:  Positive for dysuria. Negative for urgency.  Musculoskeletal:  Positive for arthralgias (bilateral knee pain). Negative for neck pain and neck stiffness.  Neurological:  Positive for headaches. Negative for dizziness, syncope, weakness, light-headedness and numbness.  Psychiatric/Behavioral:  Negative for confusion.    Physical Exam Updated Vital Signs BP (!) 142/61   Pulse 61   Temp 98.7 F (37.1 C) (Oral)   Resp 15   SpO2 96%   Physical Exam Constitutional:      General: She is not in acute distress.    Appearance: Normal appearance. She is normal weight. She is not ill-appearing.  HENT:     Head: Normocephalic. No raccoon eyes or laceration.   Eyes:     Extraocular Movements: Extraocular movements intact.     Conjunctiva/sclera: Conjunctivae normal.     Pupils: Pupils are equal, round, and reactive to light.  Cardiovascular:     Rate and Rhythm: Normal rate and regular rhythm.     Heart sounds: No murmur heard. Pulmonary:     Effort: Pulmonary effort is normal. No respiratory distress.     Breath sounds: Normal breath sounds. No wheezing, rhonchi or rales.  Abdominal:     General: Abdomen is flat. Bowel sounds are normal. There is no distension.     Palpations: Abdomen is soft.     Tenderness: There is no abdominal tenderness. There is no guarding.  Musculoskeletal:     Cervical back: Normal range of motion. Tenderness (pain with passive neck  rotation to right, no tenderness to palpation) present. No rigidity.     Right lower leg: No edema.     Left lower leg: No edema.  Skin:    General: Skin is warm and dry.  Neurological:     Mental Status: She is alert.     Cranial Nerves: Cranial nerves are intact.     Sensory: Sensation is intact.     Motor: Weakness (LUE weakness is baseline from prior stroke per patient) and tremor (RUE resting tremor) present.     Deep Tendon Reflexes:     Reflex Scores:      Bicep reflexes are 2+ on the right side and 3+ on the left side.      Patellar reflexes are 2+ on the right side and 3+ on the left side.   ED Results / Procedures / Treatments   Labs (all labs ordered are listed, but only abnormal results are displayed) Labs Reviewed  URINALYSIS, ROUTINE W REFLEX MICROSCOPIC    EKG None  Radiology DG Shoulder Right  Result Date: 06/22/2021 CLINICAL DATA:  Recent fall with right shoulder pain, initial encounter EXAM: RIGHT SHOULDER - 2+ VIEW COMPARISON:  None. FINDINGS: Degenerative changes of the acromioclavicular and glenohumeral joint are seen. No fracture or dislocation is noted. Some mild atelectatic changes in the are seen in the right mid lung. IMPRESSION: Degenerative changes about the shoulder joint. Mild atelectatic changes in the right mid lung. Electronically Signed   By: Alcide Clever M.D.   On: 06/22/2021 00:41   CT Head Wo Contrast  Result Date: 06/22/2021 CLINICAL DATA:  Fall, headache EXAM: CT HEAD WITHOUT CONTRAST CT CERVICAL SPINE WITHOUT CONTRAST TECHNIQUE: Multidetector CT imaging of the head and cervical spine was performed following the standard protocol without intravenous contrast. Multiplanar CT image reconstructions of the  cervical spine were also generated. COMPARISON:  CT head dated 05/11/2021. CT cervical spine dated 09/21/2020. FINDINGS: CT HEAD FINDINGS Brain: No evidence of acute infarction, hemorrhage, hydrocephalus, extra-axial collection or mass lesion/mass  effect. Subcortical white matter and periventricular small vessel ischemic changes. Vascular: Intracranial atherosclerosis. Skull: Normal. Negative for fracture or focal lesion. Sinuses/Orbits: The visualized paranasal sinuses are essentially clear. The mastoid air cells are unopacified. Other: Soft tissue swelling/extracranial hematoma overlying the right frontal bone (series 3/image 25). CT CERVICAL SPINE FINDINGS Alignment: Normal cervical lordosis. Skull base and vertebrae: Healed fracture deformity involving the anterior ring of C1. No evidence of acute fracture. Soft tissues and spinal canal: No prevertebral fluid or swelling. No visible canal hematoma. Disc levels: Mild degenerative changes of the mid cervical spine. Spinal canal is patent. Upper chest: Visualized lung apices are clear. Other: Visualized thyroid is unremarkable. IMPRESSION: Soft tissue swelling/extracranial hematoma overlying the right frontal bone. No evidence of calvarial fracture. Small vessel ischemic changes. No evidence of acute traumatic injury to the cervical spine. Healed fracture deformity involving the anterior ring of C1. Mild degenerative changes. Electronically Signed   By: Charline Bills M.D.   On: 06/22/2021 03:00   CT Cervical Spine Wo Contrast  Result Date: 06/22/2021 CLINICAL DATA:  Fall, headache EXAM: CT HEAD WITHOUT CONTRAST CT CERVICAL SPINE WITHOUT CONTRAST TECHNIQUE: Multidetector CT imaging of the head and cervical spine was performed following the standard protocol without intravenous contrast. Multiplanar CT image reconstructions of the cervical spine were also generated. COMPARISON:  CT head dated 05/11/2021. CT cervical spine dated 09/21/2020. FINDINGS: CT HEAD FINDINGS Brain: No evidence of acute infarction, hemorrhage, hydrocephalus, extra-axial collection or mass lesion/mass effect. Subcortical white matter and periventricular small vessel ischemic changes. Vascular: Intracranial atherosclerosis. Skull:  Normal. Negative for fracture or focal lesion. Sinuses/Orbits: The visualized paranasal sinuses are essentially clear. The mastoid air cells are unopacified. Other: Soft tissue swelling/extracranial hematoma overlying the right frontal bone (series 3/image 25). CT CERVICAL SPINE FINDINGS Alignment: Normal cervical lordosis. Skull base and vertebrae: Healed fracture deformity involving the anterior ring of C1. No evidence of acute fracture. Soft tissues and spinal canal: No prevertebral fluid or swelling. No visible canal hematoma. Disc levels: Mild degenerative changes of the mid cervical spine. Spinal canal is patent. Upper chest: Visualized lung apices are clear. Other: Visualized thyroid is unremarkable. IMPRESSION: Soft tissue swelling/extracranial hematoma overlying the right frontal bone. No evidence of calvarial fracture. Small vessel ischemic changes. No evidence of acute traumatic injury to the cervical spine. Healed fracture deformity involving the anterior ring of C1. Mild degenerative changes. Electronically Signed   By: Charline Bills M.D.   On: 06/22/2021 03:00   DG Knee Complete 4 Views Left  Result Date: 06/22/2021 CLINICAL DATA:  Fall, left knee pain EXAM: LEFT KNEE - COMPLETE 4+ VIEW COMPARISON:  None. FINDINGS: No evidence of fracture, dislocation, or joint effusion. No evidence of arthropathy or other focal bone abnormality. Soft tissues are unremarkable. IMPRESSION: Negative. Electronically Signed   By: Helyn Numbers M.D.   On: 06/22/2021 14:07   DG Knee Complete 4 Views Right  Result Date: 06/22/2021 CLINICAL DATA:  Fall, pain. EXAM: RIGHT KNEE - COMPLETE 4+ VIEW COMPARISON:  None. FINDINGS: Osseous alignment is normal. No fracture line or displaced fracture fragment seen. No significant degenerative change. No appreciable joint effusion and adjacent soft tissues are unremarkable. IMPRESSION: Negative. Electronically Signed   By: Bary Richard M.D.   On: 06/22/2021 13:55     Procedures Procedures  No procedures performed this visit  Medications Ordered in ED Medications - No data to display  ED Course  I have reviewed the triage vital signs and the nursing notes.  Pertinent labs & imaging results that were available during my care of the patient were reviewed by me and considered in my medical decision making (see chart for details).    MDM Rules/Calculators/A&P                         Aylla Huffine is a 75 y.o. female with PMHx of CVA R pontine infarct (2012), HTN, HLD, Parkinson's disease, osteoporosis, history of falls (2 previous falls last 6 months), failure to thrive, who presents to [facility] ED via EMS for ground level fall at senior living facility. Patient afebrile and HDS on arrival. [facility] negative for acute intracranial abnormality, showed soft tissue swelling/extracranial hematoma overlying the right frontal bone.. CT spine without acute abnormality. [place] films of R shoulder and bilateral knees without evidence of acute osseous abnormality. Patient without new focal neurologic deficits and no other sites of pain from fall aside from contusion on R frontal scalp. Patient has had 2 recent falls in the last 6 months. Given patient's description of fall, she likely had vasovagal syncope while straining on the toilet vs orthostatic syncope given reported low BP reading at facility. She has been normotensive while in the ED. She is endorsing recent onset dysuria. She is afebrile without leukocytosis. Will order UA to assess for UTI. Once patient has given urine sample she is stable for discharge back to senior living facility.    Final Clinical Impression(s) / ED Diagnoses Final diagnoses:  Fall from ground level    Rx / DC Orders ED Discharge Orders     None        Ellison Carwin, MD 06/22/21 1542    Alvira Monday, MD 06/23/21 1024

## 2021-10-08 ENCOUNTER — Emergency Department (HOSPITAL_BASED_OUTPATIENT_CLINIC_OR_DEPARTMENT_OTHER): Admitting: Radiology

## 2021-10-08 ENCOUNTER — Emergency Department (HOSPITAL_BASED_OUTPATIENT_CLINIC_OR_DEPARTMENT_OTHER)

## 2021-10-08 ENCOUNTER — Other Ambulatory Visit: Payer: Self-pay

## 2021-10-08 ENCOUNTER — Encounter (HOSPITAL_BASED_OUTPATIENT_CLINIC_OR_DEPARTMENT_OTHER): Payer: Self-pay

## 2021-10-08 ENCOUNTER — Emergency Department (HOSPITAL_BASED_OUTPATIENT_CLINIC_OR_DEPARTMENT_OTHER)
Admission: EM | Admit: 2021-10-08 | Discharge: 2021-10-09 | Disposition: A | Discharge status: Home / Self Care | Attending: Emergency Medicine | Admitting: Emergency Medicine

## 2021-10-08 DIAGNOSIS — S0990XA Unspecified injury of head, initial encounter: Secondary | ICD-10-CM | POA: Insufficient documentation

## 2021-10-08 DIAGNOSIS — Z7982 Long term (current) use of aspirin: Secondary | ICD-10-CM | POA: Diagnosis not present

## 2021-10-08 DIAGNOSIS — S62316A Displaced fracture of base of fifth metacarpal bone, right hand, initial encounter for closed fracture: Secondary | ICD-10-CM | POA: Diagnosis not present

## 2021-10-08 DIAGNOSIS — Y92129 Unspecified place in nursing home as the place of occurrence of the external cause: Secondary | ICD-10-CM | POA: Diagnosis not present

## 2021-10-08 DIAGNOSIS — Z79899 Other long term (current) drug therapy: Secondary | ICD-10-CM | POA: Diagnosis not present

## 2021-10-08 DIAGNOSIS — W19XXXA Unspecified fall, initial encounter: Secondary | ICD-10-CM

## 2021-10-08 DIAGNOSIS — S6291XA Unspecified fracture of right wrist and hand, initial encounter for closed fracture: Secondary | ICD-10-CM

## 2021-10-08 DIAGNOSIS — I10 Essential (primary) hypertension: Secondary | ICD-10-CM | POA: Diagnosis not present

## 2021-10-08 DIAGNOSIS — G20 Parkinson's disease: Secondary | ICD-10-CM | POA: Insufficient documentation

## 2021-10-08 DIAGNOSIS — Z96642 Presence of left artificial hip joint: Secondary | ICD-10-CM | POA: Insufficient documentation

## 2021-10-08 DIAGNOSIS — S6991XA Unspecified injury of right wrist, hand and finger(s), initial encounter: Secondary | ICD-10-CM | POA: Diagnosis present

## 2021-10-08 DIAGNOSIS — W01198A Fall on same level from slipping, tripping and stumbling with subsequent striking against other object, initial encounter: Secondary | ICD-10-CM | POA: Insufficient documentation

## 2021-10-08 NOTE — ED Provider Notes (Signed)
[facility] EMERGENCY DEPT Provider Note   CSN: [id number] Arrival date & time: 10/08/21  2216     History Chief Complaint  Patient presents with   Dana [name] is a 75 y.o. female.  The history is provided by the patient. The history is limited by the condition of the patient (level 5 caveat dementia).  Fall This is a new problem. The current episode started less than 1 hour ago. The problem occurs rarely. The problem has been resolved. Pertinent negatives include no chest pain, no abdominal pain, no headaches and no shortness of breath. Nothing aggravates the symptoms. Nothing relieves the symptoms. She has tried nothing for the symptoms. The treatment provided no relief.      Past Medical History:  Diagnosis Date   Anemia    none since hysterectomy   Arthritis    LlTHA 11'15(had a fall)-"remains tender"   Depression    Heart murmur    history of   Hyperlipidemia    Hypertension    Parkinson's disease (HCC)    Stroke (HCC)    2012 R pontine infarct (left sided weakness) -no residual   Transfusion history    age 12- "hysterectomy"    Patient Active Problem List   Diagnosis Date Noted   Gastroesophageal reflux disease without esophagitis 07/04/2020   Senile osteoporosis 07/04/2020   Recurrent falls 07/04/2020   Parkinsonism (HCC) 07/04/2020   Unsteady gait 07/04/2020   Pressure injury of skin 06/14/2020   Acute metabolic encephalopathy 05/20/2020   Confusion 05/19/2020   Generalized weakness 05/19/2020   Peri-prosthetic fracture around prosthetic hip    Fall 03/27/2020   Failure to thrive in adult 11/23/2019   Fall at home 11/23/2019   Parkinsonian features 11/23/2019   Hoarding behavior 11/23/2019   Polyneuropathy 10/04/2019   Insomnia 09/07/2018   Major depressive disorder 07/25/2018   GAD (generalized anxiety disorder) 07/25/2018   Hip fracture, left (HCC) 09/15/2014   Chronic back pain 09/15/2014   History of stroke     Hyperlipidemia    Essential hypertension    Spastic hemiplegia affecting nondominant side (HCC) 01/12/2012    Past Surgical History:  Procedure Laterality Date   ABDOMINAL HYSTERECTOMY     COLONOSCOPY WITH PROPOFOL N/A 12/25/2014   Procedure: COLONOSCOPY WITH PROPOFOL;  Surgeon: Charolett Bumpers, MD;  Location: [facility];  Service: Endoscopy;  Laterality: N/A;   FRACTURE SURGERY  01/22/12   fx of eye socket from fall-"retained plate"   HAND SURGERY Left    nerve release -many yrs ago   HIP ARTHROPLASTY Left 09/16/2014   Procedure: ARTHROPLASTY BIPOLAR HIP;  Surgeon: Sheral Apley, MD;  Location: [facility] OR;  Service: Orthopedics;  Laterality: Left;     OB History     Gravida      Para      Term      Preterm      AB      Living  1      SAB      IAB      Ectopic      Multiple      Live Births              Family History  Problem Relation Age of Onset   Hyperlipidemia Mother    Hypertension Mother    Diabetes Mother    Heart disease Mother        aortic vavle  replaced   Hypertension Father    Hyperlipidemia  Father    Heart disease Father        aortic valve replaced   Alzheimer's disease Father     Social History   Tobacco Use   Smoking status: Never   Smokeless tobacco: Never  Vaping Use   Vaping Use: Never used  Substance Use Topics   Alcohol use: Not Currently    Comment: occasional beer, wishes she could drink her vodka   Drug use: Never    Home Medications Prior to Admission medications   Medication Sig Start Date End Date Taking? Authorizing Provider  acetaminophen (TYLENOL) 500 MG tablet Take 2 tablets (1,000 mg total) by mouth every 8 (eight) hours as needed. 04/02/20   Zigmund Daniel., MD  alendronate (FOSAMAX) 70 MG tablet Take 1 tablet (70 mg total) by mouth once a week. Take with 8 oz of water 30 minutes prior to food/meds. Do not lie down for 30 minutes. 07/04/20   Ngetich, Dinah C, NP  ARIPiprazole (ABILIFY) 10 MG tablet  Take 1 tablet (10 mg total) by mouth daily. 07/04/20   Ngetich, Dinah C, NP  aspirin 81 MG chewable tablet Chew 81 mg by mouth daily.    [provider]  atorvastatin (LIPITOR) 40 MG tablet Take 1 tablet (40 mg total) by mouth daily. 07/04/20   Ngetich, Dinah C, NP  buPROPion (WELLBUTRIN XL) 150 MG 24 hr tablet Take 3 tablets (450 mg total) by mouth daily. 07/04/20   Ngetich, Dinah C, NP  carbidopa-levodopa (SINEMET) 25-100 MG tablet Take 1 tablet by mouth 3 (three) times daily. 07/04/20   Ngetich, Dinah C, NP  cephALEXin (KEFLEX) 500 MG capsule Take 1 capsule (500 mg total) by mouth 4 (four) times daily. 06/22/21   Terrilee Files, MD  Cholecalciferol (VITAMIN D) 50 MCG (2000 UT) CAPS Take 2,000 Units by mouth daily.     [provider]  gabapentin (NEURONTIN) 300 MG capsule Take 1 capsule (300 mg total) by mouth 3 (three) times daily. 07/04/20   Ngetich, Dinah C, NP  melatonin 5 MG TABS Take 5 mg by mouth at bedtime.     [provider]  metoprolol tartrate (LOPRESSOR) 25 MG tablet Take 1 tablet (25 mg total) by mouth 2 (two) times daily. 07/04/20   Ngetich, Dinah C, NP  Multiple Vitamin (MULTIVITAMIN) tablet Take 1 tablet by mouth daily.    [provider]  pantoprazole (PROTONIX) 40 MG tablet Take 1 tablet (40 mg total) by mouth daily. 07/04/20   Ngetich, Dinah C, NP  sertraline (ZOLOFT) 100 MG tablet Take 2 tablets (200 mg total) by mouth daily. 07/04/20   Ngetich, Dinah C, NP  tiZANidine (ZANAFLEX) 2 MG tablet Take 1 tablet (2 mg total) by mouth 2 (two) times daily. QAM and QHS 07/04/20   Ngetich, Dinah C, NP  traZODone (DESYREL) 50 MG tablet Take 1 tablet (50 mg total) by mouth at bedtime. 07/04/20   Ngetich, Donalee Citrin, NP    Allergies    Percodan [oxycodone-aspirin] and Oxycodone  Review of Systems   Review of Systems  Unable to perform ROS: Dementia  Constitutional:  Negative for fever.  HENT:  Negative for facial swelling.   Respiratory:  Negative for  shortness of breath.   Cardiovascular:  Negative for chest pain.  Gastrointestinal:  Negative for abdominal pain.  Musculoskeletal:  Positive for arthralgias.  Neurological:  Negative for headaches.   Physical Exam Updated Vital Signs BP (!) 151/65 (BP Location: Left Arm)   Pulse  60   Temp 98.5 F (36.9 C) (Oral)   Resp (!) 22   Ht 5\' 3"  (1.6 m)   Wt 51.3 kg   SpO2 100%   BMI 20.03 kg/m   Physical Exam Vitals and nursing note reviewed.  Constitutional:      General: She is not in acute distress.    Appearance: Normal appearance.  HENT:     Head: Normocephalic and atraumatic.     Right Ear: Tympanic membrane normal.     Left Ear: Tympanic membrane normal.     Nose: Nose normal.  Eyes:     Conjunctiva/sclera: Conjunctivae normal.     Pupils: Pupils are equal, round, and reactive to light.  Cardiovascular:     Rate and Rhythm: Normal rate and regular rhythm.     Pulses: Normal pulses.     Heart sounds: Normal heart sounds.  Pulmonary:     Effort: Pulmonary effort is normal.     Breath sounds: Normal breath sounds.  Abdominal:     General: Abdomen is flat. Bowel sounds are normal.     Palpations: Abdomen is soft.     Tenderness: There is no abdominal tenderness. There is no guarding.  Musculoskeletal:     Right wrist: Normal. No snuff box tenderness.     Left wrist: Normal. No snuff box tenderness.     Right hand: Bony tenderness present. Normal range of motion.     Left hand: Normal.     Cervical back: Normal, normal range of motion and neck supple. No tenderness.     Thoracic back: Normal.     Lumbar back: Normal.  Skin:    General: Skin is warm and dry.     Capillary Refill: Capillary refill takes less than 2 seconds.  Neurological:     General: No focal deficit present.     Mental Status: She is alert.     Deep Tendon Reflexes: Reflexes normal.  Psychiatric:        Mood and Affect: Mood normal.        Behavior: Behavior normal.    ED Results / Procedures  / Treatments   Labs (all labs ordered are listed, but only abnormal results are displayed) Labs Reviewed - No data to display  EKG None  Radiology CT Head Wo Contrast  Result Date: 10/08/2021 CLINICAL DATA:  Trauma fall EXAM: CT HEAD WITHOUT CONTRAST CT CERVICAL SPINE WITHOUT CONTRAST TECHNIQUE: Multidetector CT imaging of the head and cervical spine was performed following the standard protocol without intravenous contrast. Multiplanar CT image reconstructions of the cervical spine were also generated. COMPARISON:  CT brain and cervical spine 06/22/2021, 09/21/2020 FINDINGS: CT HEAD FINDINGS Brain: No acute territorial infarction, hemorrhage or intracranial mass. Mild atrophy and moderate chronic small vessel ischemic changes of the white matter. Stable ventricle size. Vascular: No hyperdense vessels.  Carotid vascular calcification Skull: Normal. Negative for fracture or focal lesion. Sinuses/Orbits: No acute finding. Other: Chronic appearing nasal bone deformity. Small moderate right forehead scalp hematoma CT CERVICAL SPINE FINDINGS Alignment: Stable cervical alignment. Facet alignment within normal limits. Skull base and vertebrae: Chronic fracture deformity anterior arch of C1. Vertebral body heights are maintained. No definite acute fracture is seen Soft tissues and spinal canal: No prevertebral fluid or swelling. No visible canal hematoma. Disc levels: Fusion C3-C4. Advanced degenerative changes C6-C7. Facet degenerative changes at multiple levels. Upper chest: Negative. Other: None IMPRESSION: 1. No CT evidence for acute intracranial abnormality. Atrophy and chronic small vessel ischemic  changes of the white matter 2. No definite acute osseous abnormality of the cervical spine. Old fracture deformity anterior arch of C1 Electronically Signed   By: Jasmine Pang M.D.   On: 10/08/2021 23:51   CT Cervical Spine Wo Contrast  Result Date: 10/08/2021 CLINICAL DATA:  Trauma fall EXAM: CT HEAD  WITHOUT CONTRAST CT CERVICAL SPINE WITHOUT CONTRAST TECHNIQUE: Multidetector CT imaging of the head and cervical spine was performed following the standard protocol without intravenous contrast. Multiplanar CT image reconstructions of the cervical spine were also generated. COMPARISON:  CT brain and cervical spine 06/22/2021, 09/21/2020 FINDINGS: CT HEAD FINDINGS Brain: No acute territorial infarction, hemorrhage or intracranial mass. Mild atrophy and moderate chronic small vessel ischemic changes of the white matter. Stable ventricle size. Vascular: No hyperdense vessels.  Carotid vascular calcification Skull: Normal. Negative for fracture or focal lesion. Sinuses/Orbits: No acute finding. Other: Chronic appearing nasal bone deformity. Small moderate right forehead scalp hematoma CT CERVICAL SPINE FINDINGS Alignment: Stable cervical alignment. Facet alignment within normal limits. Skull base and vertebrae: Chronic fracture deformity anterior arch of C1. Vertebral body heights are maintained. No definite acute fracture is seen Soft tissues and spinal canal: No prevertebral fluid or swelling. No visible canal hematoma. Disc levels: Fusion C3-C4. Advanced degenerative changes C6-C7. Facet degenerative changes at multiple levels. Upper chest: Negative. Other: None IMPRESSION: 1. No CT evidence for acute intracranial abnormality. Atrophy and chronic small vessel ischemic changes of the white matter 2. No definite acute osseous abnormality of the cervical spine. Old fracture deformity anterior arch of C1 Electronically Signed   By: Jasmine Pang M.D.   On: 10/08/2021 23:51   DG Hand Complete Right  Result Date: 10/08/2021 CLINICAL DATA:  Fall abrasion EXAM: RIGHT HAND - COMPLETE 3+ VIEW COMPARISON:  None. FINDINGS: Fracture deformity base of fifth metacarpal appears more chronic on the frontal views but more acute to subacute on the lateral view. No subluxation. Degenerative changes at the STT interval and IP  joints. IMPRESSION: Fracture deformity at the base of fifth metacarpal is of uncertain acuity, correlate for point tenderness. Otherwise no acute osseous abnormality is seen. Electronically Signed   By: Jasmine Pang M.D.   On: 10/08/2021 23:04   DG HIP UNILAT WITH PELVIS 2-3 VIEWS RIGHT  Result Date: 10/08/2021 CLINICAL DATA:  Fall. EXAM: DG HIP (WITH OR WITHOUT PELVIS) 2-3V RIGHT COMPARISON:  None. FINDINGS: There is no evidence of hip fracture or dislocation. There is no evidence of arthropathy or other focal bone abnormality. Left hip arthroplasty appears in anatomic alignment. IMPRESSION: Negative. Electronically Signed   By: Darliss Cheney M.D.   On: 10/08/2021 23:46    Procedures Procedures   Medications Ordered in ED Medications - No data to display  ED Course  I have reviewed the triage vital signs and the nursing notes.  Pertinent labs & imaging results that were available during my care of the patient were reviewed by me and considered in my medical decision making (see chart for details).  Splinted in the ED.  Ice, elevation and close follow up with hand surgery.  Do not submerge splint.    Final Clinical Impression(s) / ED Diagnoses Final diagnoses:  Closed fracture of right hand, initial encounter   Return for intractable cough, coughing up blood, fevers > 100.4 unrelieved by medication, shortness of breath, intractable vomiting, chest pain, shortness of breath, weakness, numbness, changes in speech, facial asymmetry, abdominal pain, passing out, Inability to tolerate liquids or food, cough, altered mental  status or any concerns. No signs of systemic illness or infection. The patient is nontoxic-appearing on exam and vital signs are within normal limits.  I have reviewed the triage vital signs and the nursing notes. Pertinent labs & imaging results that were available during my care of the patient were reviewed by me and considered in my medical decision making (see chart for  details). After history, exam, and  Rx / DC Orders ED Discharge Orders     None        Dragon Thrush, MD 10/09/21 0001

## 2021-10-08 NOTE — ED Triage Notes (Signed)
Patient BIB [facility] from [facility].  Patient fell OOB tripping on Mattress onto Floor.  NAD Noted during Triage. A&Ox4. GCS 15. No Neurological Deficits Noted during Triage. BIB Stretcher/Wheelchair. History of Parkinsons   2-3 cm Abrasion noted to Right Posterior Hand. Moderate Swelling and Bruising noted to Right Anterior Head.

## 2021-10-09 NOTE — ED Notes (Signed)
Called [facility] to transport patient back to the [facility]

## 2021-10-09 NOTE — ED Notes (Signed)
Pt resting normal breathing pattern ?

## 2021-10-16 ENCOUNTER — Emergency Department (HOSPITAL_COMMUNITY)
Admission: EM | Admit: 2021-10-16 | Discharge: 2021-10-16 | Disposition: A | Discharge status: Home / Self Care | Attending: Emergency Medicine | Admitting: Emergency Medicine

## 2021-10-16 ENCOUNTER — Encounter (HOSPITAL_COMMUNITY): Payer: Self-pay

## 2021-10-16 ENCOUNTER — Emergency Department (HOSPITAL_COMMUNITY)

## 2021-10-16 ENCOUNTER — Other Ambulatory Visit: Payer: Self-pay

## 2021-10-16 DIAGNOSIS — Z79899 Other long term (current) drug therapy: Secondary | ICD-10-CM | POA: Diagnosis not present

## 2021-10-16 DIAGNOSIS — N390 Urinary tract infection, site not specified: Secondary | ICD-10-CM | POA: Insufficient documentation

## 2021-10-16 DIAGNOSIS — Z20822 Contact with and (suspected) exposure to covid-19: Secondary | ICD-10-CM | POA: Diagnosis not present

## 2021-10-16 DIAGNOSIS — Z96642 Presence of left artificial hip joint: Secondary | ICD-10-CM | POA: Insufficient documentation

## 2021-10-16 DIAGNOSIS — U071 COVID-19: Secondary | ICD-10-CM | POA: Diagnosis not present

## 2021-10-16 DIAGNOSIS — R531 Weakness: Secondary | ICD-10-CM

## 2021-10-16 DIAGNOSIS — Z7982 Long term (current) use of aspirin: Secondary | ICD-10-CM | POA: Diagnosis not present

## 2021-10-16 DIAGNOSIS — I10 Essential (primary) hypertension: Secondary | ICD-10-CM | POA: Diagnosis not present

## 2021-10-16 DIAGNOSIS — G20 Parkinson's disease: Secondary | ICD-10-CM | POA: Insufficient documentation

## 2021-10-16 LAB — RESP PANEL BY RT-PCR (FLU A&B, COVID) ARPGX2
Influenza A by PCR: NEGATIVE
Influenza B by PCR: NEGATIVE
SARS Coronavirus 2 by RT PCR: POSITIVE — AB

## 2021-10-16 LAB — CBC WITH DIFFERENTIAL/PLATELET
Abs Immature Granulocytes: 0.01 10*3/uL (ref 0.00–0.07)
Basophils Absolute: 0 10*3/uL (ref 0.0–0.1)
Basophils Relative: 0 %
Eosinophils Absolute: 0.1 10*3/uL (ref 0.0–0.5)
Eosinophils Relative: 2 %
HCT: 34.4 % — ABNORMAL LOW (ref 36.0–46.0)
Hemoglobin: 10.6 g/dL — ABNORMAL LOW (ref 12.0–15.0)
Immature Granulocytes: 0 %
Lymphocytes Relative: 15 %
Lymphs Abs: 0.7 10*3/uL (ref 0.7–4.0)
MCH: 28.2 pg (ref 26.0–34.0)
MCHC: 30.8 g/dL (ref 30.0–36.0)
MCV: 91.5 fL (ref 80.0–100.0)
Monocytes Absolute: 0.4 10*3/uL (ref 0.1–1.0)
Monocytes Relative: 9 %
Neutro Abs: 3.5 10*3/uL (ref 1.7–7.7)
Neutrophils Relative %: 74 %
Platelets: 231 10*3/uL (ref 150–400)
RBC: 3.76 MIL/uL — ABNORMAL LOW (ref 3.87–5.11)
RDW: 13.1 % (ref 11.5–15.5)
WBC: 4.8 10*3/uL (ref 4.0–10.5)
nRBC: 0 % (ref 0.0–0.2)

## 2021-10-16 LAB — URINALYSIS, MICROSCOPIC (REFLEX)

## 2021-10-16 LAB — COMPREHENSIVE METABOLIC PANEL
ALT: 71 U/L — ABNORMAL HIGH (ref 0–44)
AST: 76 U/L — ABNORMAL HIGH (ref 15–41)
Albumin: 3.4 g/dL — ABNORMAL LOW (ref 3.5–5.0)
Alkaline Phosphatase: 80 U/L (ref 38–126)
Anion gap: 8 (ref 5–15)
BUN: 13 mg/dL (ref 8–23)
CO2: 27 mmol/L (ref 22–32)
Calcium: 9 mg/dL (ref 8.9–10.3)
Chloride: 105 mmol/L (ref 98–111)
Creatinine, Ser: 0.99 mg/dL (ref 0.44–1.00)
GFR, Estimated: 59 mL/min — ABNORMAL LOW (ref >60)
Glucose, Bld: 103 mg/dL — ABNORMAL HIGH (ref 70–99)
Potassium: 3.8 mmol/L (ref 3.5–5.1)
Sodium: 140 mmol/L (ref 135–145)
Total Bilirubin: 0.5 mg/dL (ref 0.3–1.2)
Total Protein: 6.5 g/dL (ref 6.5–8.1)

## 2021-10-16 LAB — URINALYSIS, ROUTINE W REFLEX MICROSCOPIC
Bilirubin Urine: NEGATIVE
Glucose, UA: NEGATIVE mg/dL
Hgb urine dipstick: NEGATIVE
Ketones, ur: NEGATIVE mg/dL
Nitrite: POSITIVE — AB
Protein, ur: NEGATIVE mg/dL
Specific Gravity, Urine: 1.015 (ref 1.005–1.030)
pH: 7 (ref 5.0–8.0)

## 2021-10-16 MED ORDER — CEPHALEXIN 500 MG PO CAPS: 500.0000 mg | ORAL_CAPSULE | Freq: Four times a day (QID) | ORAL | 0 refills | Status: DC

## 2021-10-16 MED ORDER — MOLNUPIRAVIR EUA 200MG CAPSULE: 4.0000 | ORAL_CAPSULE | Freq: Two times a day (BID) | ORAL | 0 refills | Status: DC

## 2021-10-16 MED ORDER — CEPHALEXIN 500 MG PO CAPS: 500.0000 mg | ORAL_CAPSULE | Freq: Four times a day (QID) | ORAL | 0 refills | Status: AC

## 2021-10-16 MED ORDER — MOLNUPIRAVIR EUA 200MG CAPSULE: 4.0000 | ORAL_CAPSULE | Freq: Two times a day (BID) | ORAL | 0 refills | Status: AC

## 2021-10-16 NOTE — ED Notes (Signed)
Pt given graham crackers, peanut butter, saltines, cheese, and applesauce. Encouraged to eat at this time. Pt eating at this time.

## 2021-10-16 NOTE — ED Triage Notes (Signed)
Tested covid + rapid test this am, covid outbreak at [employer], was more weak than normal with altered LOC. Normally ambulates with walker.

## 2021-10-16 NOTE — Discharge Instructions (Addendum)
Regarding the hand fracture, recommend following up with hand specialist as previously recommended.  Contact Dr. Thomos Lemons office to request the appointment.  Regarding the COVID infection, recommend taking the prescribed Molnipuvir.  If she develops difficulty breathing, vomiting, worsening lethargy or other new concerning symptom, recommend returning back to ER for reassessment.  Otherwise would recommend following up with primary doctor.  Urine sample showed possible UTI.  Recommend taking antibiotic as prescribed.

## 2021-10-16 NOTE — ED Provider Notes (Signed)
[facility] EMERGENCY DEPARTMENT Provider Note   CSN: [id number] Arrival date & time: 10/16/21  4235     History Chief Complaint  Patient presents with   Weakness    Dana [name] is a 74 y.o. female.  Presents to the ER with concern for generalized weakness.  History limited due to dementia, level 5 caveat.  Additional history obtained from chart review, EMS report, discussion with son via phone.  Patient tested positive for COVID-19 this morning, thought to be generally more weak than normal.  Per EMS report patient normally ambulates with walker however per discussion with son, she normally as of late has required wheelchair assistance for mobility.  When questioned directly, patient denies any acute pain or medical complaint.  Answers basic questions appropriately but struggles with any complex questioning.  Son states that patient has history of parkinsonism, has had significant functional decline and is currently enrolled in hospice, does not believe she has had formal diagnosis of dementia.  HPI     Past Medical History:  Diagnosis Date   Anemia    none since hysterectomy   Arthritis    LlTHA 11'15(had a fall)-"remains tender"   Depression    Heart murmur    history of   Hyperlipidemia    Hypertension    Parkinson's disease (HCC)    Stroke (HCC)    2012 R pontine infarct (left sided weakness) -no residual   Transfusion history    age 66- "hysterectomy"    Patient Active Problem List   Diagnosis Date Noted   Gastroesophageal reflux disease without esophagitis 07/04/2020   Senile osteoporosis 07/04/2020   Recurrent falls 07/04/2020   Parkinsonism (HCC) 07/04/2020   Unsteady gait 07/04/2020   Pressure injury of skin 06/14/2020   Acute metabolic encephalopathy 05/20/2020   Confusion 05/19/2020   Generalized weakness 05/19/2020   Peri-prosthetic fracture around prosthetic hip    Fall 03/27/2020   Failure to thrive in adult 11/23/2019   Fall at  home 11/23/2019   Parkinsonian features 11/23/2019   Hoarding behavior 11/23/2019   Polyneuropathy 10/04/2019   Insomnia 09/07/2018   Major depressive disorder 07/25/2018   GAD (generalized anxiety disorder) 07/25/2018   Hip fracture, left (HCC) 09/15/2014   Chronic back pain 09/15/2014   History of stroke    Hyperlipidemia    Essential hypertension    Spastic hemiplegia affecting nondominant side (HCC) 01/12/2012    Past Surgical History:  Procedure Laterality Date   ABDOMINAL HYSTERECTOMY     COLONOSCOPY WITH PROPOFOL N/A 12/25/2014   Procedure: COLONOSCOPY WITH PROPOFOL;  Surgeon: Charolett Bumpers, MD;  Location: [facility];  Service: Endoscopy;  Laterality: N/A;   FRACTURE SURGERY  01/22/12   fx of eye socket from fall-"retained plate"   HAND SURGERY Left    nerve release -many yrs ago   HIP ARTHROPLASTY Left 09/16/2014   Procedure: ARTHROPLASTY BIPOLAR HIP;  Surgeon: Sheral Apley, MD;  Location: [facility] OR;  Service: Orthopedics;  Laterality: Left;     OB History     Gravida      Para      Term      Preterm      AB      Living  1      SAB      IAB      Ectopic      Multiple      Live Births  Family History  Problem Relation Age of Onset   Hyperlipidemia Mother    Hypertension Mother    Diabetes Mother    Heart disease Mother        aortic vavle  replaced   Hypertension Father    Hyperlipidemia Father    Heart disease Father        aortic valve replaced   Alzheimer's disease Father     Social History   Tobacco Use   Smoking status: Never   Smokeless tobacco: Never  Vaping Use   Vaping Use: Never used  Substance Use Topics   Alcohol use: Not Currently    Comment: occasional beer, wishes she could drink her vodka   Drug use: Never    Home Medications Prior to Admission medications   Medication Sig Start Date End Date Taking? Authorizing Provider  molnupiravir EUA (LAGEVRIO) 200 mg CAPS capsule Take 4 capsules (800 mg  total) by mouth 2 (two) times daily for 5 days. 10/16/21 10/21/21 Yes Kareena Arrambide, Ellwood Dense, MD  acetaminophen (TYLENOL) 500 MG tablet Take 2 tablets (1,000 mg total) by mouth every 8 (eight) hours as needed. 04/02/20   Elodia Florence., MD  alendronate (FOSAMAX) 70 MG tablet Take 1 tablet (70 mg total) by mouth once a week. Take with 8 oz of water 30 minutes prior to food/meds. Do not lie down for 30 minutes. 07/04/20   Ngetich, Dinah C, NP  ARIPiprazole (ABILIFY) 10 MG tablet Take 1 tablet (10 mg total) by mouth daily. 07/04/20   Ngetich, Dinah C, NP  aspirin 81 MG chewable tablet Chew 81 mg by mouth daily.    [provider]  atorvastatin (LIPITOR) 40 MG tablet Take 1 tablet (40 mg total) by mouth daily. 07/04/20   Ngetich, Dinah C, NP  buPROPion (WELLBUTRIN XL) 150 MG 24 hr tablet Take 3 tablets (450 mg total) by mouth daily. 07/04/20   Ngetich, Dinah C, NP  carbidopa-levodopa (SINEMET) 25-100 MG tablet Take 1 tablet by mouth 3 (three) times daily. 07/04/20   Ngetich, Dinah C, NP  cephALEXin (KEFLEX) 500 MG capsule Take 1 capsule (500 mg total) by mouth 4 (four) times daily for 5 days. 10/16/21 10/21/21  Lucrezia Starch, MD  Cholecalciferol (VITAMIN D) 50 MCG (2000 UT) CAPS Take 2,000 Units by mouth daily.     [provider]  gabapentin (NEURONTIN) 300 MG capsule Take 1 capsule (300 mg total) by mouth 3 (three) times daily. 07/04/20   Ngetich, Dinah C, NP  melatonin 5 MG TABS Take 5 mg by mouth at bedtime.     [provider]  metoprolol tartrate (LOPRESSOR) 25 MG tablet Take 1 tablet (25 mg total) by mouth 2 (two) times daily. 07/04/20   Ngetich, Dinah C, NP  Multiple Vitamin (MULTIVITAMIN) tablet Take 1 tablet by mouth daily.    [provider]  pantoprazole (PROTONIX) 40 MG tablet Take 1 tablet (40 mg total) by mouth daily. 07/04/20   Ngetich, Dinah C, NP  sertraline (ZOLOFT) 100 MG tablet Take 2 tablets (200 mg total) by mouth daily. 07/04/20   Ngetich, Dinah C,  NP  tiZANidine (ZANAFLEX) 2 MG tablet Take 1 tablet (2 mg total) by mouth 2 (two) times daily. QAM and QHS 07/04/20   Ngetich, Dinah C, NP  traZODone (DESYREL) 50 MG tablet Take 1 tablet (50 mg total) by mouth at bedtime. 07/04/20   Ngetich, Nelda Bucks, NP    Allergies    Percodan [oxycodone-aspirin] and Oxycodone  Review of Systems   Review of Systems  Unable to perform ROS: Dementia  Constitutional:  Positive for chills and fatigue.   Physical Exam Updated Vital Signs BP 140/61   Pulse 72   Temp 98.9 F (37.2 C) (Oral)   Resp 16   Ht 5\' 4"  (1.626 m)   SpO2 92%   BMI 19.41 kg/m   Physical Exam Vitals and nursing note reviewed.  Constitutional:      General: She is not in acute distress.    Appearance: She is well-developed.  HENT:     Head: Normocephalic and atraumatic.  Eyes:     Conjunctiva/sclera: Conjunctivae normal.  Cardiovascular:     Rate and Rhythm: Normal rate and regular rhythm.     Heart sounds: No murmur heard. Pulmonary:     Effort: Pulmonary effort is normal. No respiratory distress.     Breath sounds: Normal breath sounds.  Abdominal:     Palpations: Abdomen is soft.     Tenderness: There is no abdominal tenderness.  Musculoskeletal:        General: No swelling.     Cervical back: Neck supple.     Comments: RUE - splint intact  Skin:    General: Skin is warm and dry.     Capillary Refill: Capillary refill takes less than 2 seconds.  Neurological:     General: No focal deficit present.     Mental Status: She is alert.     Comments: Alert, oriented to person but not place and time  Psychiatric:        Mood and Affect: Mood normal.    ED Results / Procedures / Treatments   Labs (all labs ordered are listed, but only abnormal results are displayed) Labs Reviewed  RESP PANEL BY RT-PCR (FLU A&B, COVID) ARPGX2 - Abnormal; Notable for the following components:      Result Value   SARS Coronavirus 2 by RT PCR POSITIVE (*)    All other components  within normal limits  CBC WITH DIFFERENTIAL/PLATELET - Abnormal; Notable for the following components:   RBC 3.76 (*)    Hemoglobin 10.6 (*)    HCT 34.4 (*)    All other components within normal limits  URINALYSIS, ROUTINE W REFLEX MICROSCOPIC - Abnormal; Notable for the following components:   Nitrite POSITIVE (*)    Leukocytes,Ua SMALL (*)    All other components within normal limits  COMPREHENSIVE METABOLIC PANEL - Abnormal; Notable for the following components:   Glucose, Bld 103 (*)    Albumin 3.4 (*)    AST 76 (*)    ALT 71 (*)    GFR, Estimated 59 (*)    All other components within normal limits  URINALYSIS, MICROSCOPIC (REFLEX) - Abnormal; Notable for the following components:   Bacteria, UA FEW (*)    All other components within normal limits    EKG EKG Interpretation  Date/Time:  Thursday October 16 2021 08:45:26 EST Ventricular Rate:  75 PR Interval:  201 QRS Duration: 93 QT Interval:  392 QTC Calculation: 438 R Axis:   57 Text Interpretation: Sinus rhythm Borderline T abnormalities, anterior leads Confirmed by Madalyn Rob [phone]) on 10/16/2021 8:49:06 AM  Radiology DG Chest Portable 1 View  Result Date: 10/16/2021 CLINICAL DATA:  Shortness of breath, COVID. EXAM: PORTABLE CHEST 1 VIEW COMPARISON:  05/19/2020 FINDINGS: Heart is normal size. Scattered aortic calcifications. Lungs clear. No effusions. No acute bony abnormality. IMPRESSION: No active disease. Electronically Signed   By:  Rolm Baptise M.D.   On: 10/16/2021 08:53    Procedures Procedures   Medications Ordered in ED Medications - No data to display  ED Course  I have reviewed the triage vital signs and the nursing notes.  Pertinent labs & imaging results that were available during my care of the patient were reviewed by me and considered in my medical decision making (see chart for details).  Clinical Course as of 10/16/21 1330  Thu Oct 16, 2021  0809 [phone] for [place] [RD]     Clinical Course User Index [RD] Lucrezia Starch, MD   MDM Rules/Calculators/A&P                           75 year old lady with history of parkinsonism, questionable dementia presents to ER with concern for generalized weakness, positive COVID-19 test.  On arrival here, patient well-appearing in no distress.  She denies any acute complaints but notably has difficulty with any complex questioning.  Discussed with son.  Patient is enrolled in hospice care, lives at the assisted living facility.  Obtain basic labs, UA, chest x-ray and observed in ER.  Was reported to be borderline hypoxic with EMS however no hypoxia was appreciated while in ER.  Basic labs stable.  UA with possible infection.  COVID is confirmed positive.  Discussed with pharmacy, patient would have extensive medication interactions with Paxlovid and recommends Molnupiravir. Will dc back to facility.    Final Clinical Impression(s) / ED Diagnoses Final diagnoses:  COVID-19  Generalized weakness  Urinary tract infection without hematuria, site unspecified    Rx / DC Orders ED Discharge Orders          Ordered    cephALEXin (KEFLEX) 500 MG capsule  4 times daily        10/16/21 1307    molnupiravir EUA (LAGEVRIO) 200 mg CAPS capsule  2 times daily        10/16/21 1328             Lucrezia Starch, MD 10/16/21 1330

## 2021-10-16 NOTE — ED Notes (Signed)
RN reviewed discharge instructions with PTAR. VSS upon discharge. 

## 2021-11-05 ENCOUNTER — Encounter: Payer: Self-pay | Admitting: Plastic Surgery

## 2021-11-05 ENCOUNTER — Ambulatory Visit (INDEPENDENT_AMBULATORY_CARE_PROVIDER_SITE_OTHER): Admitting: Plastic Surgery

## 2021-11-05 ENCOUNTER — Other Ambulatory Visit: Payer: Self-pay

## 2021-11-05 VITALS — BP 120/72 | HR 93

## 2021-11-05 DIAGNOSIS — S6291XA Unspecified fracture of right wrist and hand, initial encounter for closed fracture: Secondary | ICD-10-CM | POA: Diagnosis not present

## 2021-11-05 NOTE — Progress Notes (Signed)
Referring Provider No referring provider defined for this encounter.   CC:  Chief Complaint  Patient presents with   Consult      Dana [name] is an 75 y.o. female.  HPI: Patient presents about a month out from a fall.  She fell that she injured her right hand.  There was questionable fracture deformity at the base of the fifth metacarpal so she was splinted and has been splinted for the past 4 weeks until her follow-up appointment here.  She lives at a assisted living facility.  She reports minimal pain in her hand at this point  Allergies  Allergen Reactions   Percodan [Oxycodone-Aspirin] Nausea Only   Oxycodone Nausea Only    Outpatient Encounter Medications as of 11/05/2021  Medication Sig   acetaminophen (TYLENOL) 500 MG tablet Take 2 tablets (1,000 mg total) by mouth every 8 (eight) hours as needed.   alendronate (FOSAMAX) 70 MG tablet Take 1 tablet (70 mg total) by mouth once a week. Take with 8 oz of water 30 minutes prior to food/meds. Do not lie down for 30 minutes. (Patient taking differently: Take 70 mg by mouth every Thursday. Take with 8 oz of water 30 minutes prior to food/meds. Do not lie down for 30 minutes.)   ARIPiprazole (ABILIFY) 10 MG tablet Take 1 tablet (10 mg total) by mouth daily.   ARTIFICIAL TEAR OP Place 1 drop into both eyes 2 (two) times daily as needed (dry eyes).   aspirin 81 MG chewable tablet Chew 81 mg by mouth daily.   atorvastatin (LIPITOR) 40 MG tablet Take 1 tablet (40 mg total) by mouth daily.   buPROPion (WELLBUTRIN XL) 150 MG 24 hr tablet Take 3 tablets (450 mg total) by mouth daily.   carbidopa-levodopa (SINEMET) 25-100 MG tablet Take 1 tablet by mouth 3 (three) times daily.   Cholecalciferol (VITAMIN D) 50 MCG (2000 UT) CAPS Take 2,000 Units by mouth daily.    gabapentin (NEURONTIN) 300 MG capsule Take 1 capsule (300 mg total) by mouth 3 (three) times daily.   loperamide (IMODIUM) 2 MG capsule Take 2 mg by mouth 4 (four) times daily as  needed for diarrhea or loose stools.   melatonin 5 MG TABS Take 5 mg by mouth at bedtime.    melatonin 5 MG TABS Take 5 mg by mouth at bedtime as needed (sleep).   metoprolol tartrate (LOPRESSOR) 25 MG tablet Take 1 tablet (25 mg total) by mouth 2 (two) times daily.   Multiple Vitamin (MULTIVITAMIN) tablet Take 1 tablet by mouth daily.   pantoprazole (PROTONIX) 40 MG tablet Take 1 tablet (40 mg total) by mouth daily.   sertraline (ZOLOFT) 100 MG tablet Take 2 tablets (200 mg total) by mouth daily.   tiZANidine (ZANAFLEX) 2 MG tablet Take 1 tablet (2 mg total) by mouth 2 (two) times daily. QAM and QHS (Patient taking differently: Take 2 mg by mouth 2 (two) times daily as needed for muscle spasms.)   traZODone (DESYREL) 50 MG tablet Take 1 tablet (50 mg total) by mouth at bedtime.   No facility-administered encounter medications on file as of 11/05/2021.     Past Medical History:  Diagnosis Date   Anemia    none since hysterectomy   Arthritis    LlTHA 11'15(had a fall)-"remains tender"   Depression    Heart murmur    history of   Hyperlipidemia    Hypertension    Parkinson's disease (HCC)    Stroke (HCC)  2012 R pontine infarct (left sided weakness) -no residual   Transfusion history    age 16- "hysterectomy"    Past Surgical History:  Procedure Laterality Date   ABDOMINAL HYSTERECTOMY     COLONOSCOPY WITH PROPOFOL N/A 12/25/2014   Procedure: COLONOSCOPY WITH PROPOFOL;  Surgeon: Charolett Bumpers, MD;  Location: [facility];  Service: Endoscopy;  Laterality: N/A;   FRACTURE SURGERY  01/22/12   fx of eye socket from fall-"retained plate"   HAND SURGERY Left    nerve release -many yrs ago   HIP ARTHROPLASTY Left 09/16/2014   Procedure: ARTHROPLASTY BIPOLAR HIP;  Surgeon: Sheral Apley, MD;  Location: [facility] OR;  Service: Orthopedics;  Laterality: Left;    Family History  Problem Relation Age of Onset   Hyperlipidemia Mother    Hypertension Mother    Diabetes Mother    Heart  disease Mother        aortic vavle  replaced   Hypertension Father    Hyperlipidemia Father    Heart disease Father        aortic valve replaced   Alzheimer's disease Father     Social History   Social History Narrative   Widowed.      Review of Systems General: Denies fevers, chills, weight loss CV: Denies chest pain, shortness of breath, palpitations  Physical Exam Vitals with BMI 11/05/2021 10/16/2021 10/16/2021  Height - - -  Weight - - -  BMI - - -  Systolic 120 155 948  Diastolic 72 65 77  Pulse 93 88 89    General:  No acute distress,  Alert and oriented, Non-Toxic, Normal speech and affect Right hand: Fingers well-perfused normal cap refill palp radial pulse.  Sensation is intact throughout.  She has full range of motion of her fingers.  Nontender over the fifth metacarpal.  X-ray from her right hand from November was read and interpreted by me.  I do not totally visualize an obvious fracture in that area of the fifth metacarpal.  She certainly has degenerative changes throughout.  Assessment/Plan Patient presents 1 month out from an injury with questionable fracture of the base of the fifth metacarpal.  I suspect that if it was fractured its healed at this point.  I removed her splint and she can continue with hand therapy as needed.  She is a pretty low functional status so that could be tailored according to that.  All of her questions were answered we will see her again on an as-needed basis.  Allena Napoleon 11/05/2021, 12:58 PM

## 2021-12-19 IMAGING — MR MR HEAD WO/W CM
11 of 13 series · 35 of 48 positions shown · IV contrast (gadavist)
Comparison: Head CT 05/19/2020, brain MRI 06/02/2018

CLINICAL DATA: Encephalopathy. Additional history provided by
technologist: Encephalopathy, ongoing confusion.

EXAM:
MRI HEAD WITHOUT AND WITH CONTRAST
TECHNIQUE: Multiplanar, multiecho pulse sequences of the brain and surrounding
structures were obtained without and with intravenous contrast.
CONTRAST:  5mL GADAVIST GADOBUTROL 1 MMOL/ML IV SOLN

[Series 9: T2 · sagittal · 5.0mm · 0.47mm/px · 1 of 25 slices shown (1 of 2)]
[im 1/25]
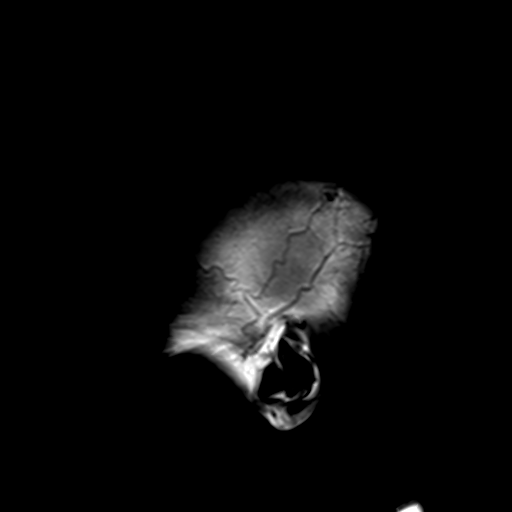

[Series 10: T2 · axial · 5.0mm · 0.45mm/px · z∈[-62,+86]mm · 2 of 24 slices shown (2 of 2)]
[im 1/24]
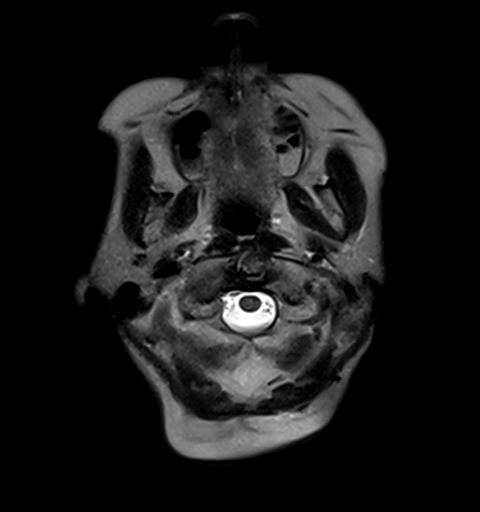
[im 24/24]
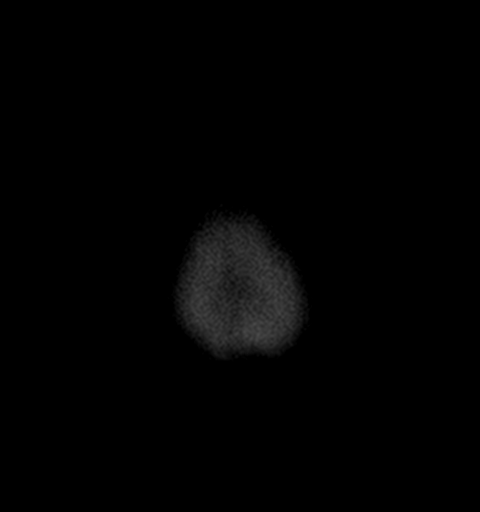

[Series 11: GRE · axial · 3.0mm · 0.45mm/px · z∈[-63,+88]mm · 5 of 52 slices shown]
[im 1/52]
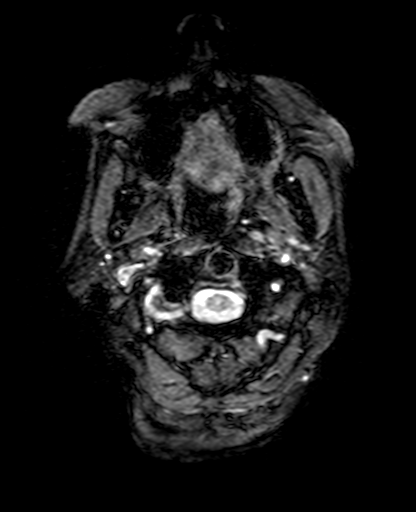
[im 13/52]
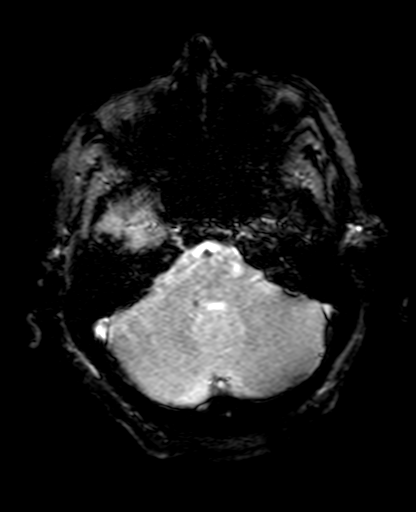
[im 26/52]
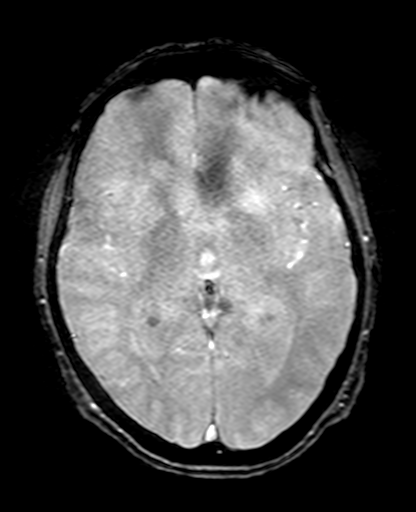
[im 39/52]
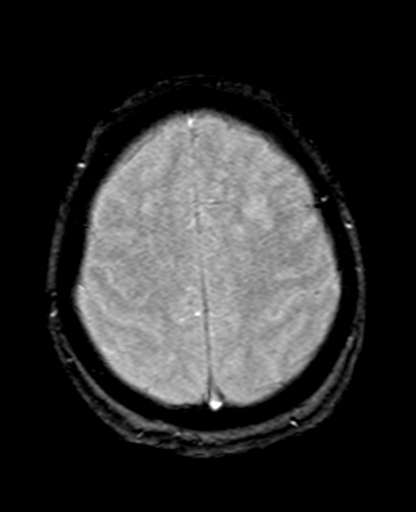
[im 52/52]
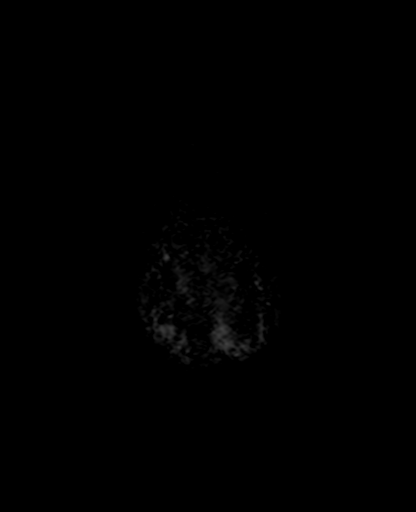

[Series 12: FLAIR · axial · 4.0mm · 0.86mm/px · z∈[-64,+90]mm · 4 of 40 slices shown]
[im 1/40]
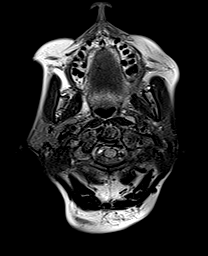
[im 14/40]
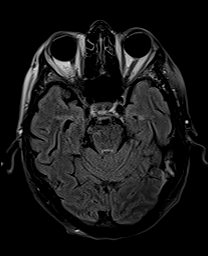
[im 27/40]
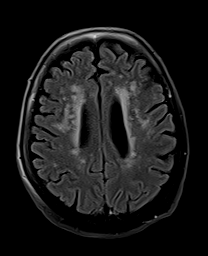
[im 40/40]
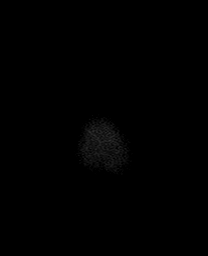

[Series 13: T1 · axial · 4.0mm · 0.45mm/px · z∈[-65,+38]mm · 3 of 40 slices shown]
[im 1/40]
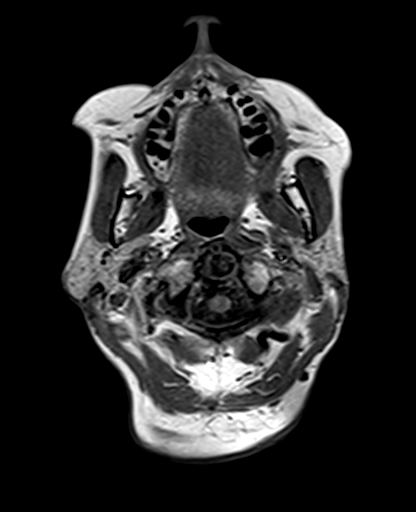
[im 14/40]
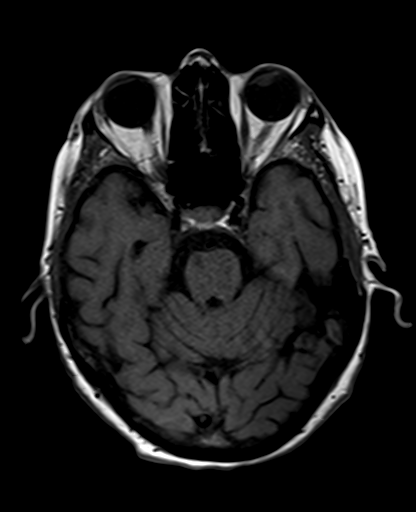
[im 27/40]
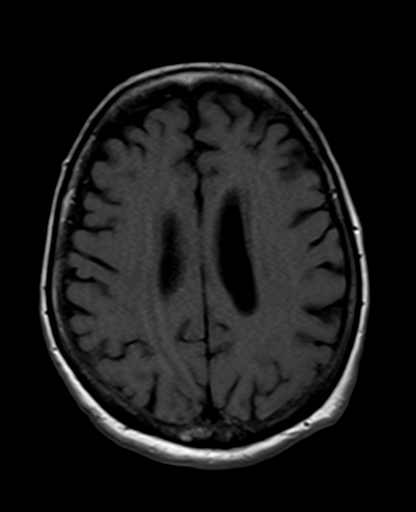

[Series 14: DWI · coronal · 5.0mm · 1.31mm/px · 5 of 55 slices shown (1 of 2)]
[im 1/55]
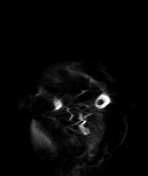
[im 14/55]
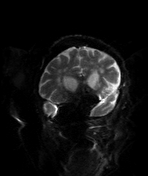
[im 28/55]
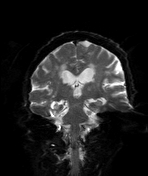
[im 41/55]
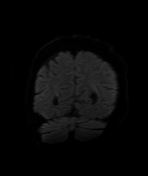
[im 55/55]
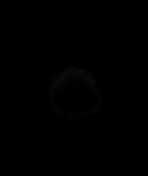

[Series 15: DWI · coronal · 5.0mm · 1.31mm/px · 3 of 28 slices shown (2 of 2)]
[im 1/28]
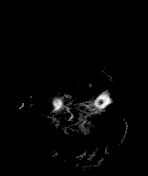
[im 14/28]
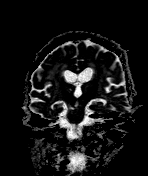
[im 28/28]
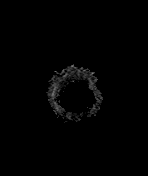

[Series 16: T2 post-contrast · coronal · 5.0mm · 0.86mm/px · 3 of 28 slices shown]
[im 1/28]
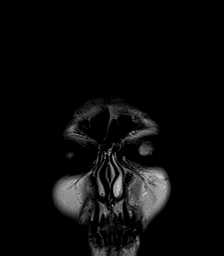
[im 14/28]
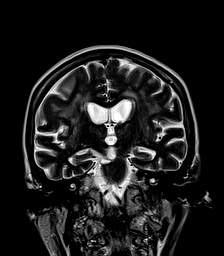
[im 28/28]
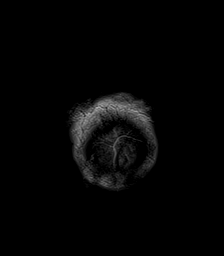

[Series 17: T1 post-contrast · axial · 4.0mm · 0.45mm/px · z∈[-65,+89]mm · 4 of 40 slices shown (1 of 3)]
[im 1/40]
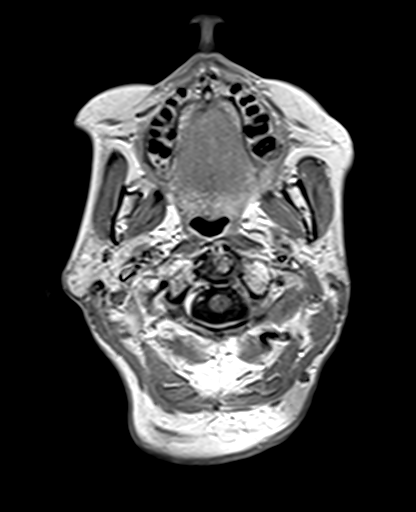
[im 14/40]
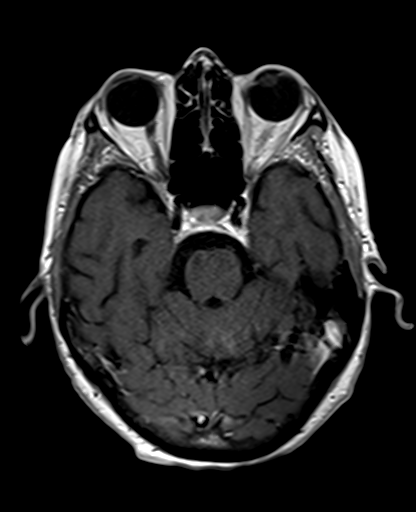
[im 27/40]
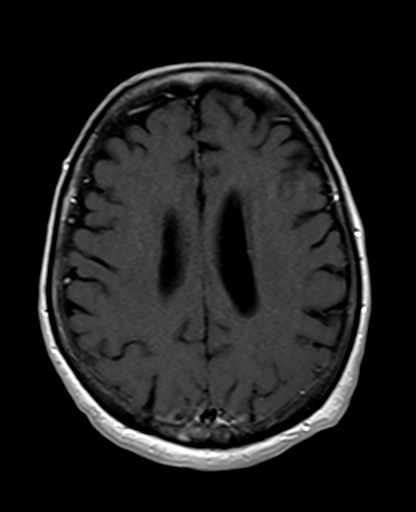
[im 40/40]
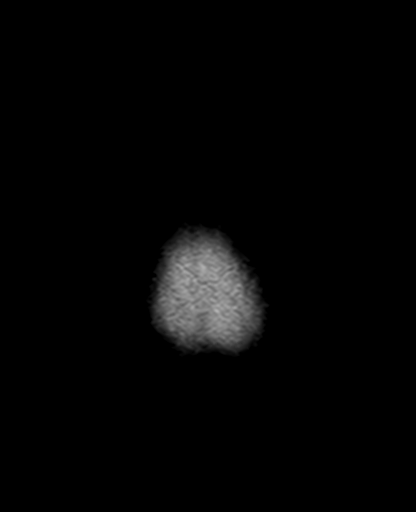

[Series 18: T1 post-contrast · coronal · 5.0mm · 0.43mm/px · 3 of 28 slices shown (2 of 3)]
[im 1/28]
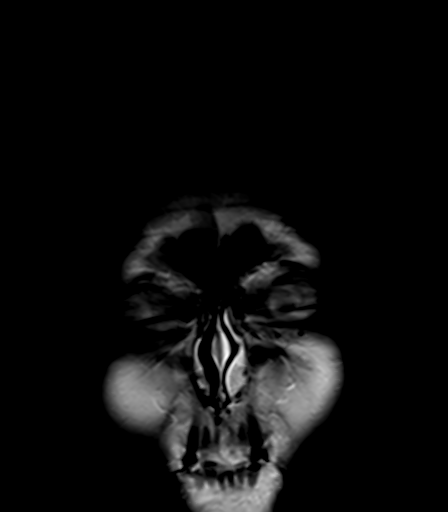
[im 14/28]
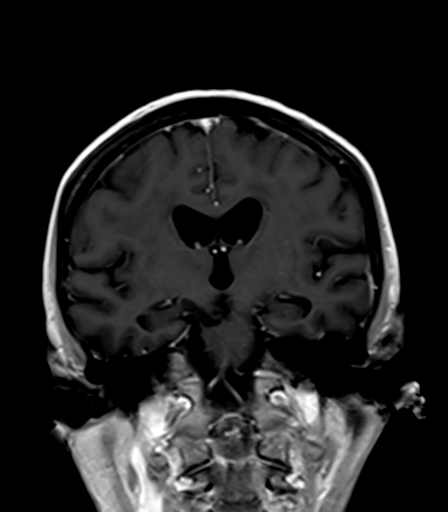
[im 28/28]
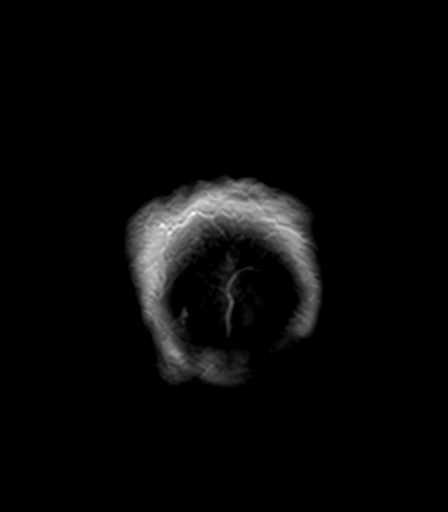

[Series 19: T1 post-contrast · sagittal · 5.0mm · 0.94mm/px · 2 of 25 slices shown (3 of 3)]
[im 1/25]
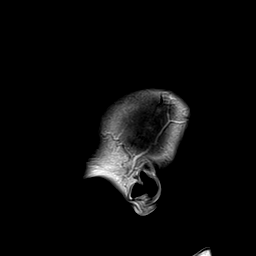
[im 25/25]
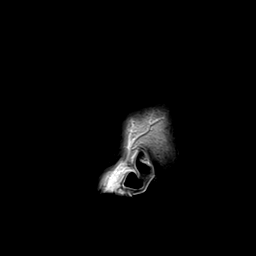

[35 of 48 positions shown; findings below may reference images not displayed]

FINDINGS: Brain:

There is stable, mild generalized parenchymal atrophy. Cerebral
atrophy demonstrates no definite lobar predominance.

Similar to prior MRI 06/02/2018, there is moderate to advanced
patchy T2/FLAIR hyperintensity within the cerebral white matter
which is nonspecific, but consistent with chronic small vessel
ischemic disease.

Redemonstrated chronic infarct within the right pons and midbrain.

There is no acute infarct.

No evidence of intracranial mass.

No chronic intracranial blood products.

No extra-axial fluid collection.

No midline shift.

No abnormal intracranial enhancement.

Vascular: Expected proximal arterial flow voids.

Skull and upper cervical spine: No focal marrow lesion. Degenerative
changes at the C1-C2 level with associated pannus formation.

Sinuses/Orbits: Right lens replacement. Visualized orbits show no
acute finding. Trace ethmoid sinus mucosal thickening. No
significant mastoid effusion.
IMPRESSION: No evidence of acute intracranial abnormality.

Stable mild generalized parenchymal atrophy and moderate/advanced
cerebral white matter chronic small vessel ischemic disease.

Redemonstrated chronic infarct within the right pons and midbrain.

Trace ethmoid sinus mucosal thickening.

## 2021-12-31 ENCOUNTER — Telehealth: Payer: Self-pay | Admitting: Physician Assistant

## 2021-12-31 NOTE — Telephone Encounter (Signed)
Just an update on Pt. Son Mal Amabile called today  reporting he has now moved her to nursing home in [place]. He lives there. Will fax ROI and POA requesting records be sent to nursing home for review.

## 2022-01-01 NOTE — Telephone Encounter (Signed)
Noted  

## 2024-12-10 DEATH — deceased
